# Patient Record
Sex: Male | Born: 1945 | Race: White | Hispanic: No | Marital: Married | Smoking: Former smoker
Health system: Southern US, Community
[De-identification: ages and names within clinical notes are randomized; demographics above are authoritative.]

## PROBLEM LIST (undated history)

## (undated) DIAGNOSIS — M1990 Unspecified osteoarthritis, unspecified site: Secondary | ICD-10-CM

## (undated) DIAGNOSIS — E785 Hyperlipidemia, unspecified: Secondary | ICD-10-CM

## (undated) DIAGNOSIS — I2510 Atherosclerotic heart disease of native coronary artery without angina pectoris: Secondary | ICD-10-CM

## (undated) DIAGNOSIS — F329 Major depressive disorder, single episode, unspecified: Secondary | ICD-10-CM

## (undated) DIAGNOSIS — Z955 Presence of coronary angioplasty implant and graft: Secondary | ICD-10-CM

## (undated) DIAGNOSIS — K219 Gastro-esophageal reflux disease without esophagitis: Secondary | ICD-10-CM

## (undated) DIAGNOSIS — I2119 ST elevation (STEMI) myocardial infarction involving other coronary artery of inferior wall: Secondary | ICD-10-CM

## (undated) DIAGNOSIS — F419 Anxiety disorder, unspecified: Secondary | ICD-10-CM

## (undated) DIAGNOSIS — I208 Other forms of angina pectoris: Secondary | ICD-10-CM

## (undated) DIAGNOSIS — N400 Enlarged prostate without lower urinary tract symptoms: Secondary | ICD-10-CM

## (undated) DIAGNOSIS — F32A Depression, unspecified: Secondary | ICD-10-CM

## (undated) HISTORY — DX: Major depressive disorder, single episode, unspecified: F32.9

## (undated) HISTORY — DX: Gastro-esophageal reflux disease without esophagitis: K21.9

## (undated) HISTORY — DX: Depression, unspecified: F32.A

## (undated) HISTORY — DX: Benign prostatic hyperplasia without lower urinary tract symptoms: N40.0

## (undated) HISTORY — PX: OTHER SURGICAL HISTORY: SHX169

## (undated) HISTORY — PX: COLONOSCOPY: SHX174

## (undated) HISTORY — DX: Anxiety disorder, unspecified: F41.9

---

## 2000-12-18 ENCOUNTER — Encounter: Admission: RE | Admit: 2000-12-18 | Discharge: 2000-12-18 | Payer: Self-pay | Admitting: Family Medicine

## 2000-12-18 ENCOUNTER — Encounter: Payer: Self-pay | Admitting: Family Medicine

## 2005-01-02 ENCOUNTER — Emergency Department (HOSPITAL_COMMUNITY): Admission: EM | Admit: 2005-01-02 | Discharge: 2005-01-02 | Payer: Self-pay | Admitting: Emergency Medicine

## 2006-08-29 ENCOUNTER — Encounter: Admission: RE | Admit: 2006-08-29 | Discharge: 2006-08-29 | Payer: Self-pay | Admitting: Family Medicine

## 2007-07-13 ENCOUNTER — Inpatient Hospital Stay (HOSPITAL_COMMUNITY): Admission: AC | Admit: 2007-07-13 | Discharge: 2007-07-14 | Payer: Self-pay

## 2007-07-14 ENCOUNTER — Ambulatory Visit: Payer: Self-pay | Admitting: Psychiatry

## 2007-07-14 ENCOUNTER — Inpatient Hospital Stay (HOSPITAL_COMMUNITY): Admission: RE | Admit: 2007-07-14 | Discharge: 2007-07-26 | Payer: Self-pay | Admitting: Psychiatry

## 2007-07-16 ENCOUNTER — Ambulatory Visit (HOSPITAL_COMMUNITY): Admission: RE | Admit: 2007-07-16 | Discharge: 2007-07-16 | Payer: Self-pay | Admitting: Psychiatry

## 2007-07-18 ENCOUNTER — Ambulatory Visit (HOSPITAL_COMMUNITY): Admission: RE | Admit: 2007-07-18 | Discharge: 2007-07-18 | Payer: Self-pay | Admitting: Psychiatry

## 2007-07-27 ENCOUNTER — Other Ambulatory Visit (HOSPITAL_COMMUNITY): Admission: RE | Admit: 2007-07-27 | Discharge: 2007-10-25 | Payer: Self-pay | Admitting: Psychiatry

## 2007-08-10 ENCOUNTER — Ambulatory Visit (HOSPITAL_COMMUNITY): Payer: Self-pay | Admitting: Licensed Clinical Social Worker

## 2007-08-12 ENCOUNTER — Ambulatory Visit (HOSPITAL_COMMUNITY): Payer: Self-pay | Admitting: Psychiatry

## 2007-08-19 ENCOUNTER — Ambulatory Visit (HOSPITAL_COMMUNITY): Payer: Self-pay | Admitting: Licensed Clinical Social Worker

## 2007-08-27 ENCOUNTER — Ambulatory Visit (HOSPITAL_COMMUNITY): Payer: Self-pay | Admitting: Licensed Clinical Social Worker

## 2007-09-02 ENCOUNTER — Ambulatory Visit (HOSPITAL_COMMUNITY): Payer: Self-pay | Admitting: Licensed Clinical Social Worker

## 2007-09-03 ENCOUNTER — Ambulatory Visit (HOSPITAL_COMMUNITY): Payer: Self-pay | Admitting: Psychiatry

## 2007-09-09 ENCOUNTER — Ambulatory Visit (HOSPITAL_COMMUNITY): Payer: Self-pay | Admitting: Licensed Clinical Social Worker

## 2007-09-23 ENCOUNTER — Ambulatory Visit (HOSPITAL_COMMUNITY): Payer: Self-pay | Admitting: Licensed Clinical Social Worker

## 2007-09-28 ENCOUNTER — Ambulatory Visit (HOSPITAL_COMMUNITY): Payer: Self-pay | Admitting: Licensed Clinical Social Worker

## 2007-10-05 ENCOUNTER — Ambulatory Visit (HOSPITAL_COMMUNITY): Payer: Self-pay | Admitting: Psychiatry

## 2007-10-07 ENCOUNTER — Ambulatory Visit (HOSPITAL_COMMUNITY): Payer: Self-pay | Admitting: Licensed Clinical Social Worker

## 2007-10-13 ENCOUNTER — Ambulatory Visit (HOSPITAL_COMMUNITY): Payer: Self-pay | Admitting: Licensed Clinical Social Worker

## 2007-10-20 ENCOUNTER — Ambulatory Visit (HOSPITAL_COMMUNITY): Payer: Self-pay | Admitting: Licensed Clinical Social Worker

## 2007-10-26 ENCOUNTER — Ambulatory Visit (HOSPITAL_COMMUNITY): Payer: Self-pay | Admitting: Licensed Clinical Social Worker

## 2007-11-02 ENCOUNTER — Ambulatory Visit (HOSPITAL_COMMUNITY): Payer: Self-pay | Admitting: Psychiatry

## 2007-11-12 ENCOUNTER — Ambulatory Visit (HOSPITAL_COMMUNITY): Payer: Self-pay | Admitting: Licensed Clinical Social Worker

## 2007-11-19 ENCOUNTER — Ambulatory Visit (HOSPITAL_COMMUNITY): Payer: Self-pay | Admitting: Licensed Clinical Social Worker

## 2007-11-23 ENCOUNTER — Ambulatory Visit (HOSPITAL_COMMUNITY): Payer: Self-pay | Admitting: Licensed Clinical Social Worker

## 2007-12-02 ENCOUNTER — Ambulatory Visit (HOSPITAL_COMMUNITY): Payer: Self-pay | Admitting: Licensed Clinical Social Worker

## 2007-12-07 ENCOUNTER — Ambulatory Visit (HOSPITAL_COMMUNITY): Payer: Self-pay | Admitting: Psychiatry

## 2007-12-09 ENCOUNTER — Ambulatory Visit (HOSPITAL_COMMUNITY): Payer: Self-pay | Admitting: Licensed Clinical Social Worker

## 2007-12-17 ENCOUNTER — Ambulatory Visit (HOSPITAL_COMMUNITY): Payer: Self-pay | Admitting: Licensed Clinical Social Worker

## 2007-12-23 ENCOUNTER — Ambulatory Visit (HOSPITAL_COMMUNITY): Payer: Self-pay | Admitting: Licensed Clinical Social Worker

## 2007-12-31 ENCOUNTER — Ambulatory Visit (HOSPITAL_COMMUNITY): Payer: Self-pay | Admitting: Licensed Clinical Social Worker

## 2008-01-12 ENCOUNTER — Ambulatory Visit (HOSPITAL_COMMUNITY): Payer: Self-pay | Admitting: Psychiatry

## 2008-01-20 ENCOUNTER — Ambulatory Visit (HOSPITAL_COMMUNITY): Payer: Self-pay | Admitting: Licensed Clinical Social Worker

## 2008-02-04 ENCOUNTER — Ambulatory Visit (HOSPITAL_COMMUNITY): Payer: Self-pay | Admitting: Licensed Clinical Social Worker

## 2008-02-18 ENCOUNTER — Ambulatory Visit (HOSPITAL_COMMUNITY): Payer: Self-pay | Admitting: Licensed Clinical Social Worker

## 2008-03-09 ENCOUNTER — Ambulatory Visit (HOSPITAL_COMMUNITY): Payer: Self-pay | Admitting: Psychiatry

## 2008-03-24 ENCOUNTER — Ambulatory Visit (HOSPITAL_COMMUNITY): Payer: Self-pay | Admitting: Licensed Clinical Social Worker

## 2008-04-15 ENCOUNTER — Ambulatory Visit (HOSPITAL_COMMUNITY): Payer: Self-pay | Admitting: Psychiatry

## 2008-04-21 ENCOUNTER — Ambulatory Visit (HOSPITAL_COMMUNITY): Payer: Self-pay | Admitting: Psychiatry

## 2008-05-05 ENCOUNTER — Ambulatory Visit (HOSPITAL_COMMUNITY): Payer: Self-pay | Admitting: Psychiatry

## 2008-05-12 ENCOUNTER — Encounter: Payer: Self-pay | Admitting: Cardiology

## 2008-05-26 ENCOUNTER — Ambulatory Visit (HOSPITAL_COMMUNITY): Payer: Self-pay | Admitting: Psychiatry

## 2008-06-08 ENCOUNTER — Ambulatory Visit (HOSPITAL_COMMUNITY): Payer: Self-pay | Admitting: Psychiatry

## 2008-06-17 ENCOUNTER — Ambulatory Visit (HOSPITAL_COMMUNITY): Payer: Self-pay | Admitting: Psychiatry

## 2008-07-06 ENCOUNTER — Ambulatory Visit (HOSPITAL_COMMUNITY): Payer: Self-pay | Admitting: Psychiatry

## 2008-07-20 ENCOUNTER — Ambulatory Visit (HOSPITAL_COMMUNITY): Payer: Self-pay | Admitting: Psychiatry

## 2008-07-27 ENCOUNTER — Ambulatory Visit (HOSPITAL_COMMUNITY): Payer: Self-pay | Admitting: Psychiatry

## 2008-08-08 ENCOUNTER — Ambulatory Visit (HOSPITAL_COMMUNITY): Payer: Self-pay | Admitting: Psychiatry

## 2008-08-25 ENCOUNTER — Ambulatory Visit (HOSPITAL_COMMUNITY): Payer: Self-pay | Admitting: Psychiatry

## 2008-10-03 ENCOUNTER — Ambulatory Visit (HOSPITAL_COMMUNITY): Payer: Self-pay | Admitting: Psychiatry

## 2008-10-31 ENCOUNTER — Ambulatory Visit (HOSPITAL_COMMUNITY): Payer: Self-pay | Admitting: Psychiatry

## 2008-11-02 ENCOUNTER — Ambulatory Visit (HOSPITAL_COMMUNITY): Payer: Self-pay | Admitting: Psychiatry

## 2008-11-17 ENCOUNTER — Ambulatory Visit (HOSPITAL_COMMUNITY): Payer: Self-pay | Admitting: Psychiatry

## 2008-12-07 ENCOUNTER — Ambulatory Visit (HOSPITAL_COMMUNITY): Payer: Self-pay | Admitting: Psychiatry

## 2008-12-21 ENCOUNTER — Ambulatory Visit (HOSPITAL_COMMUNITY): Payer: Self-pay | Admitting: Psychiatry

## 2009-01-03 ENCOUNTER — Ambulatory Visit (HOSPITAL_COMMUNITY): Payer: Self-pay | Admitting: Psychiatry

## 2009-01-25 ENCOUNTER — Ambulatory Visit (HOSPITAL_COMMUNITY): Payer: Self-pay | Admitting: Psychiatry

## 2009-02-08 ENCOUNTER — Ambulatory Visit (HOSPITAL_COMMUNITY): Payer: Self-pay | Admitting: Psychiatry

## 2009-03-10 ENCOUNTER — Ambulatory Visit (HOSPITAL_COMMUNITY): Payer: Self-pay | Admitting: Psychiatry

## 2009-04-12 ENCOUNTER — Ambulatory Visit (HOSPITAL_COMMUNITY): Payer: Self-pay | Admitting: Psychiatry

## 2009-04-19 ENCOUNTER — Ambulatory Visit (HOSPITAL_COMMUNITY): Payer: Self-pay | Admitting: Psychiatry

## 2009-05-10 ENCOUNTER — Ambulatory Visit (HOSPITAL_COMMUNITY): Payer: Self-pay | Admitting: Psychiatry

## 2009-05-24 ENCOUNTER — Ambulatory Visit (HOSPITAL_COMMUNITY): Payer: Self-pay | Admitting: Psychiatry

## 2009-06-14 ENCOUNTER — Ambulatory Visit (HOSPITAL_COMMUNITY): Payer: Self-pay | Admitting: Licensed Clinical Social Worker

## 2009-06-19 ENCOUNTER — Encounter: Payer: Self-pay | Admitting: Cardiology

## 2009-06-26 ENCOUNTER — Encounter: Payer: Self-pay | Admitting: Cardiology

## 2009-06-30 ENCOUNTER — Encounter: Payer: Self-pay | Admitting: Cardiology

## 2009-07-12 ENCOUNTER — Ambulatory Visit (HOSPITAL_COMMUNITY): Payer: Self-pay | Admitting: Licensed Clinical Social Worker

## 2009-08-07 ENCOUNTER — Ambulatory Visit: Payer: Self-pay | Admitting: Cardiology

## 2009-08-07 DIAGNOSIS — R079 Chest pain, unspecified: Secondary | ICD-10-CM

## 2009-08-23 ENCOUNTER — Ambulatory Visit (HOSPITAL_COMMUNITY): Payer: Self-pay | Admitting: Licensed Clinical Social Worker

## 2009-08-30 ENCOUNTER — Ambulatory Visit: Payer: Self-pay | Admitting: Cardiology

## 2009-08-30 ENCOUNTER — Ambulatory Visit: Payer: Self-pay

## 2009-09-13 ENCOUNTER — Encounter: Admission: RE | Admit: 2009-09-13 | Discharge: 2009-09-13 | Payer: Self-pay | Admitting: Family Medicine

## 2009-09-27 ENCOUNTER — Ambulatory Visit (HOSPITAL_COMMUNITY): Payer: Self-pay | Admitting: Psychiatry

## 2009-10-25 ENCOUNTER — Ambulatory Visit (HOSPITAL_COMMUNITY): Payer: Self-pay | Admitting: Psychiatry

## 2009-11-01 ENCOUNTER — Ambulatory Visit (HOSPITAL_COMMUNITY): Payer: Self-pay | Admitting: Psychiatry

## 2009-11-29 ENCOUNTER — Ambulatory Visit (HOSPITAL_COMMUNITY): Payer: Self-pay | Admitting: Psychiatry

## 2009-12-20 ENCOUNTER — Ambulatory Visit (HOSPITAL_COMMUNITY): Payer: Self-pay | Admitting: Licensed Clinical Social Worker

## 2010-01-03 ENCOUNTER — Ambulatory Visit (HOSPITAL_COMMUNITY): Payer: Self-pay | Admitting: Psychiatry

## 2010-02-21 ENCOUNTER — Ambulatory Visit (HOSPITAL_COMMUNITY): Payer: Self-pay | Admitting: Licensed Clinical Social Worker

## 2010-03-28 ENCOUNTER — Ambulatory Visit (HOSPITAL_COMMUNITY): Payer: Self-pay | Admitting: Psychiatry

## 2010-04-30 ENCOUNTER — Ambulatory Visit (HOSPITAL_COMMUNITY): Payer: Self-pay | Admitting: Licensed Clinical Social Worker

## 2010-06-20 ENCOUNTER — Ambulatory Visit (HOSPITAL_COMMUNITY): Payer: Self-pay | Admitting: Psychiatry

## 2010-06-27 ENCOUNTER — Ambulatory Visit (HOSPITAL_COMMUNITY): Payer: Self-pay | Admitting: Psychiatry

## 2010-10-01 ENCOUNTER — Ambulatory Visit (HOSPITAL_COMMUNITY)
Admission: RE | Admit: 2010-10-01 | Discharge: 2010-10-01 | Payer: Self-pay | Source: Home / Self Care | Attending: Licensed Clinical Social Worker | Admitting: Licensed Clinical Social Worker

## 2010-10-24 ENCOUNTER — Encounter (HOSPITAL_COMMUNITY): Payer: Managed Care, Other (non HMO) | Admitting: Physician Assistant

## 2010-10-24 DIAGNOSIS — F332 Major depressive disorder, recurrent severe without psychotic features: Secondary | ICD-10-CM

## 2010-12-11 ENCOUNTER — Encounter (HOSPITAL_COMMUNITY): Payer: Managed Care, Other (non HMO) | Admitting: Physician Assistant

## 2010-12-11 DIAGNOSIS — F411 Generalized anxiety disorder: Secondary | ICD-10-CM

## 2010-12-11 DIAGNOSIS — F988 Other specified behavioral and emotional disorders with onset usually occurring in childhood and adolescence: Secondary | ICD-10-CM

## 2010-12-11 DIAGNOSIS — F331 Major depressive disorder, recurrent, moderate: Secondary | ICD-10-CM

## 2011-01-01 ENCOUNTER — Encounter (HOSPITAL_COMMUNITY): Payer: Self-pay | Admitting: Marriage and Family Therapist

## 2011-01-01 ENCOUNTER — Encounter (HOSPITAL_BASED_OUTPATIENT_CLINIC_OR_DEPARTMENT_OTHER): Payer: Managed Care, Other (non HMO) | Admitting: Licensed Clinical Social Worker

## 2011-01-01 DIAGNOSIS — F332 Major depressive disorder, recurrent severe without psychotic features: Secondary | ICD-10-CM
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
## 2011-01-29 NOTE — Op Note (Signed)
[name], Randy Peterson              ACCOUNT NO.:  [id number]   MEDICAL RECORD NO.:  [id number]          PATIENT TYPE:  EMS   LOCATION:  MAJO                         FACILITY:  [facility]   PHYSICIAN:  Gabrielle Dare. Janee Morn, M.D.DATE OF BIRTH:  January 04, 1946   DATE OF PROCEDURE:  07/13/2007  DATE OF DISCHARGE:                               OPERATIVE REPORT   PROCEDURE:  Simple closure of bilateral wrist lacerations.   INDICATIONS:  Self-inflicted lacerations of bilateral wrists.   SURGEON:  Nolon Bussing. Tinnie Gens, PA-C.   ASSISTANTS:  None.   ANESTHESIA:  1% lidocaine with epinephrine.   ESTIMATED BLOOD LOSS:  Less than 5 mL.   COMPLICATIONS:  None.   FINDINGS:  After the patient was prepped, 1% lidocaine with epinephrine  was infiltrated into the left wrist for anesthesia without difficulty.  He was then prepped and draped in the sterile fashion.  A subcuticular  Vicryl Rapide suture was used to close the transverse laceration to the  left wrist.  A similar technique was employed for the smaller  laceration.  He was then dressed without difficulty.  Attention was then  directed to the right wrist.  Again, after prepping, anesthesia was  infiltrated without difficulty.  He was then prepped, draped, and  irrigated in sterile fashion.  As these lacerations were more regular,  they were closed with interrupted 5-0 Prolene sutures.  The patient  tolerated both procedures well.      Earney Hamburg, P.A.      Gabrielle Dare Janee Morn, M.D.  Electronically Signed    MJ/MEDQ  D:  07/13/2007  T:  07/13/2007  Job:  [id number]
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
## 2011-02-06 ENCOUNTER — Encounter (HOSPITAL_COMMUNITY): Payer: Managed Care, Other (non HMO) | Admitting: Licensed Clinical Social Worker

## 2011-02-15 ENCOUNTER — Other Ambulatory Visit: Payer: Self-pay | Admitting: Family Medicine

## 2011-02-15 ENCOUNTER — Encounter (HOSPITAL_COMMUNITY): Payer: Managed Care, Other (non HMO) | Admitting: Licensed Clinical Social Worker

## 2011-02-15 ENCOUNTER — Ambulatory Visit
Admission: RE | Admit: 2011-02-15 | Discharge: 2011-02-15 | Disposition: A | Discharge status: Home / Self Care | Payer: Managed Care, Other (non HMO) | Source: Ambulatory Visit | Attending: Family Medicine | Admitting: Family Medicine

## 2011-02-15 DIAGNOSIS — R52 Pain, unspecified: Secondary | ICD-10-CM

## 2011-02-15 DIAGNOSIS — F331 Major depressive disorder, recurrent, moderate: Secondary | ICD-10-CM

## 2011-04-02 ENCOUNTER — Encounter (HOSPITAL_COMMUNITY): Payer: Managed Care, Other (non HMO) | Admitting: Physician Assistant

## 2011-04-10 ENCOUNTER — Encounter (HOSPITAL_COMMUNITY): Payer: Managed Care, Other (non HMO) | Admitting: Physician Assistant

## 2011-04-19 ENCOUNTER — Encounter (HOSPITAL_BASED_OUTPATIENT_CLINIC_OR_DEPARTMENT_OTHER): Payer: Managed Care, Other (non HMO) | Admitting: Licensed Clinical Social Worker

## 2011-04-19 DIAGNOSIS — F331 Major depressive disorder, recurrent, moderate: Secondary | ICD-10-CM

## 2011-04-30 ENCOUNTER — Encounter (HOSPITAL_COMMUNITY): Payer: Managed Care, Other (non HMO) | Admitting: Licensed Clinical Social Worker

## 2011-05-03 ENCOUNTER — Encounter (HOSPITAL_COMMUNITY): Payer: Managed Care, Other (non HMO) | Admitting: Licensed Clinical Social Worker

## 2011-05-03 DIAGNOSIS — F331 Major depressive disorder, recurrent, moderate: Secondary | ICD-10-CM

## 2011-05-10 ENCOUNTER — Encounter (HOSPITAL_COMMUNITY): Payer: Managed Care, Other (non HMO) | Admitting: Licensed Clinical Social Worker

## 2011-05-10 DIAGNOSIS — F331 Major depressive disorder, recurrent, moderate: Secondary | ICD-10-CM

## 2011-05-16 ENCOUNTER — Encounter (INDEPENDENT_AMBULATORY_CARE_PROVIDER_SITE_OTHER): Payer: Managed Care, Other (non HMO) | Admitting: Physician Assistant

## 2011-05-16 DIAGNOSIS — F331 Major depressive disorder, recurrent, moderate: Secondary | ICD-10-CM

## 2011-05-16 DIAGNOSIS — F411 Generalized anxiety disorder: Secondary | ICD-10-CM

## 2011-05-16 DIAGNOSIS — F988 Other specified behavioral and emotional disorders with onset usually occurring in childhood and adolescence: Secondary | ICD-10-CM

## 2011-05-21 ENCOUNTER — Encounter (HOSPITAL_COMMUNITY): Payer: Managed Care, Other (non HMO) | Admitting: Licensed Clinical Social Worker

## 2011-05-21 DIAGNOSIS — F331 Major depressive disorder, recurrent, moderate: Secondary | ICD-10-CM

## 2011-05-24 ENCOUNTER — Encounter (HOSPITAL_COMMUNITY): Payer: Managed Care, Other (non HMO) | Admitting: Licensed Clinical Social Worker

## 2011-06-11 ENCOUNTER — Encounter (HOSPITAL_COMMUNITY): Payer: Managed Care, Other (non HMO) | Admitting: Licensed Clinical Social Worker

## 2011-06-11 DIAGNOSIS — F331 Major depressive disorder, recurrent, moderate: Secondary | ICD-10-CM

## 2011-06-17 ENCOUNTER — Encounter (HOSPITAL_COMMUNITY): Payer: Managed Care, Other (non HMO) | Admitting: Licensed Clinical Social Worker

## 2011-06-25 LAB — VITAMIN B12: Vitamin B-12: 496 (ref 211–911)

## 2011-06-25 LAB — CREATININE, SERUM: GFR calc Af Amer: >60

## 2011-06-26 LAB — I-STAT 8, (EC8 V) (CONVERTED LAB)
Bicarbonate: 25.6 — ABNORMAL HIGH
Chloride: 107
Glucose, Bld: 127 — ABNORMAL HIGH
HCT: 35 — ABNORMAL LOW
Operator id: 198171
Sodium: 141

## 2011-06-26 LAB — URINALYSIS, MICROSCOPIC ONLY
Bilirubin Urine: NEGATIVE
Glucose, UA: NEGATIVE
Hgb urine dipstick: NEGATIVE
Leukocytes, UA: NEGATIVE
Nitrite: NEGATIVE
Specific Gravity, Urine: 1.014
pH: 6

## 2011-06-26 LAB — CBC
HCT: 34.2 — ABNORMAL LOW
Hemoglobin: 12.1 — ABNORMAL LOW
MCHC: 34.2
MCV: 90.3
Platelets: 232
Platelets: 233
RBC: 3.95 — ABNORMAL LOW
RDW: 13.6
WBC: 7.2

## 2011-06-26 LAB — ACETAMINOPHEN LEVEL: Acetaminophen (Tylenol), Serum: <10 — ABNORMAL LOW

## 2011-06-26 LAB — TSH: TSH: 1.658

## 2011-06-26 LAB — RAPID URINE DRUG SCREEN, HOSP PERFORMED
Amphetamines: NOT DETECTED
Barbiturates: NOT DETECTED
Cocaine: NOT DETECTED
Tetrahydrocannabinol: NOT DETECTED

## 2011-06-26 LAB — BASIC METABOLIC PANEL
Glucose, Bld: 119 — ABNORMAL HIGH
Sodium: 140

## 2011-06-26 LAB — SALICYLATE LEVEL: Salicylate Lvl: <4

## 2011-06-26 LAB — POCT I-STAT CREATININE: Creatinine, Ser: 1.3

## 2011-06-26 LAB — SAMPLE TO BLOOD BANK

## 2011-06-26 LAB — ETHANOL: Alcohol, Ethyl (B): <5

## 2011-06-28 ENCOUNTER — Encounter (HOSPITAL_COMMUNITY): Payer: Managed Care, Other (non HMO) | Admitting: Licensed Clinical Social Worker

## 2011-07-01 ENCOUNTER — Encounter (HOSPITAL_COMMUNITY): Payer: Managed Care, Other (non HMO) | Admitting: Licensed Clinical Social Worker

## 2011-07-01 DIAGNOSIS — F331 Major depressive disorder, recurrent, moderate: Secondary | ICD-10-CM

## 2011-07-03 ENCOUNTER — Encounter (HOSPITAL_COMMUNITY): Payer: Managed Care, Other (non HMO) | Admitting: Licensed Clinical Social Worker

## 2011-07-19 ENCOUNTER — Encounter (HOSPITAL_COMMUNITY): Payer: Managed Care, Other (non HMO) | Admitting: Licensed Clinical Social Worker

## 2011-07-19 DIAGNOSIS — F331 Major depressive disorder, recurrent, moderate: Secondary | ICD-10-CM

## 2011-08-15 ENCOUNTER — Ambulatory Visit (INDEPENDENT_AMBULATORY_CARE_PROVIDER_SITE_OTHER): Payer: Managed Care, Other (non HMO) | Admitting: Physician Assistant

## 2011-08-15 DIAGNOSIS — F331 Major depressive disorder, recurrent, moderate: Secondary | ICD-10-CM

## 2011-08-15 DIAGNOSIS — F411 Generalized anxiety disorder: Secondary | ICD-10-CM

## 2011-08-15 DIAGNOSIS — F988 Other specified behavioral and emotional disorders with onset usually occurring in childhood and adolescence: Secondary | ICD-10-CM

## 2011-08-15 MED ORDER — LISDEXAMFETAMINE DIMESYLATE 70 MG PO CAPS: 70.0000 mg | ORAL_CAPSULE | ORAL | Status: DC

## 2011-08-15 NOTE — Progress Notes (Signed)
   [facility] Behavioral Health Follow-up Outpatient Visit  Randy Peterson [birth year]  Date: 08/15/11   Subjective: Randy Peterson reports that he is doing fairly well. He is fully retired now but still dealing with his mothers estate, which is a source of stress. He reports that his mood has been stable. He wants to continue taking the Vyvanse as his wife complains that he can't get anything done around the house. His anxiety is well managed. He is sleeping well and his appetite is good. He denies any suicidal or homicidal ideation. He denies any auditory or visual hallucinations.  There were no vitals filed for this visit.  Mental Status Examination  Appearance: Well groomed and dressed Alert: Yes Attention: good  Cooperative: Yes Eye Contact: Good Speech: Clear and even Psychomotor Activity: Normal Memory/Concentration: Intact Oriented: person, place, time/date and situation Mood: Euthymic Affect: Congruent Thought Processes and Associations: Linear Fund of Knowledge: Good Thought Content:  Insight: Good Judgement: Good  Diagnosis: Maj. depressive disorder recurrent moderate, generalized anxiety disorder, ADHD inattentive type.  Treatment Plan: We will continue all medications as prescribed and followup in 3 months.  Sharonna Vinje, PA

## 2011-09-11 ENCOUNTER — Other Ambulatory Visit (HOSPITAL_COMMUNITY): Payer: Self-pay | Admitting: Physician Assistant

## 2011-09-11 DIAGNOSIS — F331 Major depressive disorder, recurrent, moderate: Secondary | ICD-10-CM

## 2011-09-23 ENCOUNTER — Ambulatory Visit (HOSPITAL_COMMUNITY): Payer: Managed Care, Other (non HMO) | Admitting: Licensed Clinical Social Worker

## 2011-10-03 ENCOUNTER — Other Ambulatory Visit (HOSPITAL_COMMUNITY): Payer: Self-pay | Admitting: Physician Assistant

## 2011-10-03 DIAGNOSIS — F331 Major depressive disorder, recurrent, moderate: Secondary | ICD-10-CM

## 2011-10-03 DIAGNOSIS — F411 Generalized anxiety disorder: Secondary | ICD-10-CM

## 2011-10-07 ENCOUNTER — Ambulatory Visit (INDEPENDENT_AMBULATORY_CARE_PROVIDER_SITE_OTHER): Payer: Managed Care, Other (non HMO) | Admitting: Licensed Clinical Social Worker

## 2011-10-07 ENCOUNTER — Encounter (HOSPITAL_COMMUNITY): Payer: Self-pay | Admitting: Licensed Clinical Social Worker

## 2011-10-07 DIAGNOSIS — F332 Major depressive disorder, recurrent severe without psychotic features: Secondary | ICD-10-CM

## 2011-10-07 NOTE — Progress Notes (Signed)
   THERAPIST PROGRESS NOTE  Session Time: 8:45am-9:30am  Participation Level: Active  Behavioral Response: Well GroomedAlertEuthymic  Type of Therapy: Individual Therapy  Treatment Goals addressed: Coping  Interventions: Solution Focused, Strength-based, Supportive and Family Systems  Summary: Randy Peterson is a 66 y.o. male who presents with euthymic mood and affect.He is 15 minutes late for his appointment today and reports that he is always late for things and gets distracted, losses track of time and ends up late. While this frustrates him, he has been unable to learn proper time management. This is the first session with patient in two months since this therapist has been on leave. Patient continues to deal with closing his mothers estate and describes stress related to this process. He has worked through most of his resentment towards parts of his family who have been unhelpful throughout this process. He is officially retired and plans to work part time after the estate is settled. His marriage is under stress due to settling the estate and he is bothered by his wife's resentment towards family members. His appetite is increased, with weight gain and his sleep schedule is disrupted. He goes to sleep at 2am some nights and will sleep 8 hours. He recognizes the need to get his sleep on a regular schedule.    Suicidal/Homicidal: Nowithout intent/plan  Therapist Response: Overall, patient is doing well. Assisted him with processing his grief reaction. Encouraged him to take a break from going through all his mothers photos and papers once he hands the house over. His self care needs improvement and he recognizes that. Explored and educated patient on anticipatory feelings dealing with leaving the home he grew up in to someone else. Assisted patient with the expression of his feelings of grief and sadness. Used motivational interviewing to assist and encourage patient through the change  process. Explored patients barriers to change. His attention and focus remain compromised.  Plan: Return again in six weeks.  Diagnosis: Axis I: Major Depression, Recurrent severe    Axis II: Deferred    Lisseth Brazeau, LCSW 10/07/2011

## 2011-11-14 ENCOUNTER — Ambulatory Visit (INDEPENDENT_AMBULATORY_CARE_PROVIDER_SITE_OTHER): Payer: Managed Care, Other (non HMO) | Admitting: Physician Assistant

## 2011-11-14 DIAGNOSIS — F988 Other specified behavioral and emotional disorders with onset usually occurring in childhood and adolescence: Secondary | ICD-10-CM

## 2011-11-14 DIAGNOSIS — F331 Major depressive disorder, recurrent, moderate: Secondary | ICD-10-CM

## 2011-11-14 DIAGNOSIS — F411 Generalized anxiety disorder: Secondary | ICD-10-CM

## 2011-11-14 MED ORDER — LISDEXAMFETAMINE DIMESYLATE 70 MG PO CAPS: 70.0000 mg | ORAL_CAPSULE | ORAL | Status: DC

## 2011-11-14 MED ORDER — DULOXETINE HCL 30 MG PO CPEP: 90.0000 mg | ORAL_CAPSULE | Freq: Every day | ORAL | Status: DC

## 2011-11-14 NOTE — Progress Notes (Signed)
   [facility] Behavioral Health Follow-up Outpatient Visit  Randy Peterson [birth year]  Date: 11/14/11   Subjective: Randy Peterson presents today to followup on his medications for anxiety, depression, and ADHD. He reports that he has been doing very well. He feels that retirement has been good for him. He is staying busy cleaning out his deceased mothers home in [place] preparing it to sell. He reports that his mood has been stable, his anxiety is well managed, he is eating and sleeping well, and he has not experienced any SI/HI nor AVH.  There were no vitals filed for this visit.  Mental Status Examination  Appearance: Well groomed and casually dressed Alert: Yes Attention: good  Cooperative: Yes Eye Contact: Good Speech: Clear and even Psychomotor Activity: Normal Memory/Concentration: Intact Oriented: person, place, time/date and situation Mood: Euthymic Affect: Appropriate Thought Processes and Associations: Goal Directed and Linear Fund of Knowledge: Good Thought Content:  Insight: Good Judgement: Good  Diagnosis: Generalized anxiety disorder, major depressive disorder, ADHD.  Treatment Plan: We will continue all his medications as currently prescribed - alprazolam 1 mg 1 tablet each morning, one half tablet each evening, and one tablet at bedtime; Wellbutrin XL 150 mg daily; Cymbalta 90 mg daily and Vyvanse 70 mg daily. He will return for followup in 3 months  Sade Hollon, PA

## 2011-11-18 ENCOUNTER — Ambulatory Visit (HOSPITAL_COMMUNITY): Payer: Managed Care, Other (non HMO) | Admitting: Licensed Clinical Social Worker

## 2011-11-27 ENCOUNTER — Ambulatory Visit (HOSPITAL_COMMUNITY): Payer: Managed Care, Other (non HMO) | Admitting: Licensed Clinical Social Worker

## 2011-12-12 ENCOUNTER — Ambulatory Visit (INDEPENDENT_AMBULATORY_CARE_PROVIDER_SITE_OTHER): Payer: Managed Care, Other (non HMO) | Admitting: Licensed Clinical Social Worker

## 2011-12-12 ENCOUNTER — Encounter (HOSPITAL_COMMUNITY): Payer: Self-pay | Admitting: Licensed Clinical Social Worker

## 2011-12-12 DIAGNOSIS — F332 Major depressive disorder, recurrent severe without psychotic features: Secondary | ICD-10-CM | POA: Insufficient documentation

## 2011-12-12 NOTE — Progress Notes (Signed)
   THERAPIST PROGRESS NOTE  Session Time: 2:00pm-2:50pm  Participation Level: Active  Behavioral Response: Well GroomedAlertEuthymic  Type of Therapy: Individual Therapy  Treatment Goals addressed: Coping  Interventions: CBT, Motivational Interviewing, Strength-based and Supportive  Summary: Randy Peterson is a 66 y.o. male who presents with euthymic mood and bright affect. He reports doing well since his last session, now that he has finished settling his mothers estate. He is relieved that he has cleaned out his mothers home, has sold his portion and has used the money to fix his home. He feels guilty about how he has spent the money and guilty that he sold out his portion of the family home. He questions what his mother would have wanted him to do. He feels like he is getting more in touch with his feelings of loss now that his schedule is slowing down. He is working out at [employer] and wants to loose weight. He is happier now that he is retired and plans to look for volunteer work. His sleep and appetite are wnl.    Suicidal/Homicidal: Nowithout intent/plan  Therapist Response: Processed with patient his grief and loss. Normalized his grief response and encouraged him to express his sadness. Explored his feelings of guilt, provided feedback and reframed his distorted thoughts. Used CBT to assist patient with the identification of his negative distortions and irrational thoughts. Encouraged patient to verbalize alternative and factual responses which challenge his distortions. He demonstrates improved motivation to find activities and recreation now that he is no longer working. Overall, he is doing very well. Reviewed patients self care plan. Assessed his progress related to self care. Patient's self care is good. Recommend proper diet, regular exercise, socialization and recreation.   Plan: Return again in three months.  Diagnosis: Axis I: Major Depression, Recurrent severe    Axis II:  No diagnosis    Merridy Pascoe, LCSW 12/12/2011

## 2011-12-22 ENCOUNTER — Ambulatory Visit (INDEPENDENT_AMBULATORY_CARE_PROVIDER_SITE_OTHER): Payer: Managed Care, Other (non HMO) | Admitting: Emergency Medicine

## 2011-12-22 VITALS — BP 129/82 | HR 85 | Temp 98.3°F | Resp 16 | Ht 67.0 in | Wt 203.0 lb

## 2011-12-22 DIAGNOSIS — S61209A Unspecified open wound of unspecified finger without damage to nail, initial encounter: Secondary | ICD-10-CM

## 2011-12-22 NOTE — Patient Instructions (Signed)
Keep the area clean angina. Try to keep his ear clean and dry for the next week for the next week. If he has any drainage or redness he is to return to clinic

## 2011-12-22 NOTE — Progress Notes (Signed)
  Subjective:    Patient ID: Randy Peterson, male    DOB: 1946/02/17, 66 y.o.   MRN: [id number]  HPI    Review of Systems     Objective:   Physical Exam   Wound Procedure:  1.5 cm wound on left 2nd digit washed for 5 minutes with soap and water after injecting 1 cc of 1% lidocaine.  Telfa gauze applied and wide bandaid, then cotton wrapping and coban applied.  Wound instructions given, change dressing daily, apply tegaderm with daily dressing changes for first week.  Avoid getting finger wet except when bathing.       Assessment & Plan:  Finger Laceration cleansed and bandaged, instructions given.  Pt up to date on tetnus.

## 2011-12-22 NOTE — Progress Notes (Signed)
  Subjective:    Patient ID: Randy Peterson, male    DOB: [birth year], 66 y.o.   MRN: [id number]  HPI patient enters after trying to help his wife move some furniture sustaining a laceration to the index finger of the left hand. He is not having any active bleeding he has no other complaints he had tetanus shot about a year ago to    Review of Systems     Objective:   Physical Exam objective exam reveals a 1-1/2 cm area of evulsed skin off of the radial side of the index finger over the middle phalanx. This is not a laceration to true loss of the dermis. And epidermis.        Assessment & Plan:  We discussed options undermining the tissue around the laceration site and then approximating the margins versus treating the area with Xeroform and time. His tetanus is up-to-date with his treating it with Xeroform and time and allow this to heal fairly to

## 2011-12-29 ENCOUNTER — Other Ambulatory Visit (HOSPITAL_COMMUNITY): Payer: Self-pay | Admitting: Physician Assistant

## 2011-12-29 DIAGNOSIS — F411 Generalized anxiety disorder: Secondary | ICD-10-CM

## 2011-12-29 DIAGNOSIS — F331 Major depressive disorder, recurrent, moderate: Secondary | ICD-10-CM

## 2012-01-13 ENCOUNTER — Other Ambulatory Visit (HOSPITAL_COMMUNITY): Payer: Self-pay | Admitting: Physician Assistant

## 2012-02-04 ENCOUNTER — Ambulatory Visit (INDEPENDENT_AMBULATORY_CARE_PROVIDER_SITE_OTHER): Payer: Managed Care, Other (non HMO) | Admitting: Physician Assistant

## 2012-02-04 DIAGNOSIS — F411 Generalized anxiety disorder: Secondary | ICD-10-CM

## 2012-02-04 DIAGNOSIS — F909 Attention-deficit hyperactivity disorder, unspecified type: Secondary | ICD-10-CM

## 2012-02-04 DIAGNOSIS — F329 Major depressive disorder, single episode, unspecified: Secondary | ICD-10-CM

## 2012-02-04 DIAGNOSIS — F988 Other specified behavioral and emotional disorders with onset usually occurring in childhood and adolescence: Secondary | ICD-10-CM

## 2012-02-04 DIAGNOSIS — F330 Major depressive disorder, recurrent, mild: Secondary | ICD-10-CM

## 2012-02-04 MED ORDER — LISDEXAMFETAMINE DIMESYLATE 70 MG PO CAPS: 70.0000 mg | ORAL_CAPSULE | ORAL | Status: DC
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
## 2012-02-12 ENCOUNTER — Ambulatory Visit (HOSPITAL_COMMUNITY): Payer: Managed Care, Other (non HMO) | Admitting: Physician Assistant

## 2012-03-12 ENCOUNTER — Other Ambulatory Visit (HOSPITAL_COMMUNITY): Payer: Self-pay | Admitting: Physician Assistant

## 2012-03-12 DIAGNOSIS — F411 Generalized anxiety disorder: Secondary | ICD-10-CM

## 2012-03-13 ENCOUNTER — Ambulatory Visit (INDEPENDENT_AMBULATORY_CARE_PROVIDER_SITE_OTHER): Payer: Managed Care, Other (non HMO) | Admitting: Licensed Clinical Social Worker

## 2012-03-13 DIAGNOSIS — F332 Major depressive disorder, recurrent severe without psychotic features: Secondary | ICD-10-CM

## 2012-03-13 NOTE — Progress Notes (Signed)
   THERAPIST PROGRESS NOTE  Session Time: 1:00pm-1:50pm  Participation Level: Active  Behavioral Response: Well GroomedAlertEuthymic  Type of Therapy: Individual Therapy  Treatment Goals addressed: Coping  Interventions: Motivational Interviewing and Supportive  Summary: Randy Peterson is a 66 y.o. male who presents with euthymic mood and bright affect. He reports that he has been doing well, with an absence of depression, anxiety and no thoughts or desire for self harm. He does endorse some guilt over being retired and adjusting to this new lifestyle. He has been taking trips to the beach with his wife and finds himself much more relaxed than he has ever been. He does want to find something to involve himself in come fall and wants to volunteer with the [employer]. He continues to lack motivation for exercise and expresses a desire to loose weight, but has not followed through on his goals to address this. His sleep and appetite are wnl.    Suicidal/Homicidal: Nowithout intent/plan  Therapist Response: Processed w/pt his progress and assessed continuing treatment. Both patient and this therapist agree that therapy services will end today, due to patients well sustained improvement. Reviewed the progress that patient has made in treatment, the coping strategies that work for him and how he will continue to manage his stress. Reinforced the importance of self care and used motivational interviewing to assist patient with the change process for exercise. Will close out patients case, but advised patient that he may return if he needs to.   Plan: Patient is discharged from services due to improvement.   Diagnosis: Axis I: Major Depression, Recurrent severe    Axis II: No diagnosis    Randy Stan, LCSW 03/13/2012

## 2012-03-18 ENCOUNTER — Other Ambulatory Visit (HOSPITAL_COMMUNITY): Payer: Self-pay | Admitting: *Deleted

## 2012-03-18 NOTE — Telephone Encounter (Signed)
Xanax ordered on 03/12/12 was phoned in to pharmacy.

## 2012-04-07 ENCOUNTER — Other Ambulatory Visit (HOSPITAL_COMMUNITY): Payer: Self-pay | Admitting: Physician Assistant

## 2012-06-01 ENCOUNTER — Ambulatory Visit (INDEPENDENT_AMBULATORY_CARE_PROVIDER_SITE_OTHER): Payer: Managed Care, Other (non HMO) | Admitting: Physician Assistant

## 2012-06-01 DIAGNOSIS — F330 Major depressive disorder, recurrent, mild: Secondary | ICD-10-CM

## 2012-06-01 DIAGNOSIS — F988 Other specified behavioral and emotional disorders with onset usually occurring in childhood and adolescence: Secondary | ICD-10-CM

## 2012-06-01 DIAGNOSIS — F411 Generalized anxiety disorder: Secondary | ICD-10-CM

## 2012-06-01 MED ORDER — DULOXETINE HCL 30 MG PO CPEP: 90.0000 mg | ORAL_CAPSULE | Freq: Every day | ORAL | Status: DC

## 2012-06-01 MED ORDER — LISDEXAMFETAMINE DIMESYLATE 70 MG PO CAPS: 70.0000 mg | ORAL_CAPSULE | ORAL | Status: DC

## 2012-06-01 MED ORDER — ALPRAZOLAM 1 MG PO TABS: ORAL_TABLET | ORAL | Status: DC

## 2012-06-01 MED ORDER — BUPROPION HCL ER (XL) 150 MG PO TB24: 150.0000 mg | ORAL_TABLET | ORAL | Status: DC

## 2012-06-01 NOTE — Progress Notes (Signed)
   [facility] Behavioral Health Follow-up Outpatient Visit  Hoyt Leanos October 26, 1945  Date: 06/01/2012  Subjective: Randy Peterson presents today to followup on his treatment for depression and anxiety, as well as ADD. He reports that he is doing very well. His wife commented to him that this is the best summer they've ever had together. This is due to the fact that he is retired and she has summers off because she's a Chartered loss adjuster. They were able to take trips to the beach and spend quality time together. Antonio endorses that he is sleeping well, approximately 6-1/2 hours per night. His appetite is good. He is going to the gym, and trying to get his wife to join him. He is considering volunteer work or getting a part-time position now that his wife is back at work. He has reduced the amount of Xanax he has been taking, and reports now he takes 1 mg each morning, and one half milligram each evening. He denies any suicidal or homicidal ideation. He denies any auditory or visual hallucinations.  There were no vitals filed for this visit.  Mental Status Examination  Appearance: Casual Alert: Yes Attention: good  Cooperative: Yes Eye Contact: Good Speech: Clear and coherent Psychomotor Activity: Normal Memory/Concentration: Intact Oriented: person, place, time/date and situation Mood: Euthymic Affect: Appropriate Thought Processes and Associations: Linear Fund of Knowledge: Good Thought Content: Normal Insight: Good Judgement: Good  Diagnosis: Generalized anxiety disorder; major depressive disorder, recurrent, mild; ADHD, inattentive type  Treatment Plan: We will continue his Wellbutrin XL 150 mg daily, Cymbalta 90 mg daily, Vyvanse at 70 mg daily. We will reduce his Xanax to 1 mg each morning and one half milligram each evening. He will return for followup in 4 months.  Alisa Stjames, PA-C

## 2012-06-26 ENCOUNTER — Other Ambulatory Visit (HOSPITAL_COMMUNITY): Payer: Self-pay | Admitting: Physician Assistant

## 2012-07-27 ENCOUNTER — Other Ambulatory Visit (HOSPITAL_COMMUNITY): Payer: Self-pay | Admitting: *Deleted

## 2012-07-27 DIAGNOSIS — F988 Other specified behavioral and emotional disorders with onset usually occurring in childhood and adolescence: Secondary | ICD-10-CM

## 2012-07-27 DIAGNOSIS — F330 Major depressive disorder, recurrent, mild: Secondary | ICD-10-CM

## 2012-07-27 DIAGNOSIS — F411 Generalized anxiety disorder: Secondary | ICD-10-CM

## 2012-07-27 MED ORDER — LISDEXAMFETAMINE DIMESYLATE 70 MG PO CAPS: 70.0000 mg | ORAL_CAPSULE | ORAL | Status: DC

## 2012-07-28 ENCOUNTER — Telehealth (HOSPITAL_COMMUNITY): Payer: Self-pay

## 2012-07-28 MED ORDER — ALPRAZOLAM 1 MG PO TABS: ORAL_TABLET | ORAL | Status: DC

## 2012-07-28 MED ORDER — DULOXETINE HCL 30 MG PO CPEP: 90.0000 mg | ORAL_CAPSULE | Freq: Every day | ORAL | Status: DC

## 2012-07-28 MED ORDER — BUPROPION HCL ER (XL) 150 MG PO TB24: 150.0000 mg | ORAL_TABLET | ORAL | Status: DC

## 2012-07-28 NOTE — Telephone Encounter (Signed)
Contacted [facility] @ [phone] to discontinue refills currently on file. Patient brought printed RX for Vyvanse and Xanax to office with request for 90 day RXs.  Shredded printed RXs for: Vyvanse written 06/01/12-fill after 07/29/12 and Xanax written 06/01/12 w/5 refills. Contacted pt to notify him 90 day printed RX available for pick up at the office. New pharmacy [facility], [address] of [address]

## 2012-07-28 NOTE — Telephone Encounter (Signed)
07/28/12 3:04PM PT PICK-UP RX SCRIPT./SH

## 2012-10-01 ENCOUNTER — Ambulatory Visit (INDEPENDENT_AMBULATORY_CARE_PROVIDER_SITE_OTHER): Payer: Managed Care, Other (non HMO) | Admitting: Physician Assistant

## 2012-10-01 DIAGNOSIS — F411 Generalized anxiety disorder: Secondary | ICD-10-CM

## 2012-10-01 DIAGNOSIS — F988 Other specified behavioral and emotional disorders with onset usually occurring in childhood and adolescence: Secondary | ICD-10-CM

## 2012-10-01 DIAGNOSIS — F330 Major depressive disorder, recurrent, mild: Secondary | ICD-10-CM

## 2012-10-04 ENCOUNTER — Other Ambulatory Visit (HOSPITAL_COMMUNITY): Payer: Self-pay | Admitting: Physician Assistant

## 2012-10-05 ENCOUNTER — Telehealth (HOSPITAL_COMMUNITY): Payer: Self-pay

## 2012-10-06 ENCOUNTER — Other Ambulatory Visit (HOSPITAL_COMMUNITY): Payer: Self-pay | Admitting: Physician Assistant

## 2012-10-06 MED ORDER — DULOXETINE HCL 30 MG PO CPEP: 90.0000 mg | ORAL_CAPSULE | Freq: Every day | ORAL | Status: DC

## 2012-10-06 NOTE — Telephone Encounter (Signed)
Insurance will only pay for 90 day supply. 

## 2012-10-19 NOTE — Progress Notes (Signed)
   [facility] Behavioral Health Follow-up Outpatient Visit  Lynne Righi 08/24/1946  Date: 10/01/2012   Subjective: Randy Peterson presents today to followup on his treatment for anxiety and depression. He reports that he is now on Medicare. He states that his mood is "pretty good." He complains that it is still difficult for him to get up in the morning. He states that he doesn't go to bed until 1 AM, but he is asleep in his recliner before that. He expresses some decrease in his level of motivation, but he denies that he is depressed or anxious. He reports he has tried to decrease his Xanax, and now is not taking any in the evening for a period of about one month. He denies any suicidal or homicidal ideation. He denies any auditory or visual hallucinations.  There were no vitals filed for this visit.  Mental Status Examination  Appearance: Casual Alert: Yes Attention: good  Cooperative: Yes Eye Contact: Good Speech: Clear and coherent Psychomotor Activity: Normal Memory/Concentration: Intact Oriented: person, place, time/date and situation Mood: Euthymic Affect: Congruent Thought Processes and Associations: Linear Fund of Knowledge: Good Thought Content: Normal Insight: Good Judgement: Good  Diagnosis: Maj. depressive disorder, recurrent, mild; generalized anxiety disorder  Treatment Plan: We will continue his Vyvanse at 70 mg daily, Wellbutrin XL 150 mg daily, Cymbalta 90 mg daily, and Xanax 1 mg each morning and 1 mg at bedtime. He will return for followup in 4 months.  Khalon Cansler, PA-C

## 2012-11-04 ENCOUNTER — Other Ambulatory Visit (HOSPITAL_COMMUNITY): Payer: Self-pay | Admitting: Physician Assistant

## 2013-02-01 ENCOUNTER — Encounter (HOSPITAL_COMMUNITY): Payer: Self-pay | Admitting: Physician Assistant

## 2013-02-01 ENCOUNTER — Ambulatory Visit (INDEPENDENT_AMBULATORY_CARE_PROVIDER_SITE_OTHER): Payer: Medicare Other | Admitting: Physician Assistant

## 2013-02-01 VITALS — BP 133/89 | HR 115 | Ht 66.0 in | Wt 207.4 lb

## 2013-02-01 DIAGNOSIS — F330 Major depressive disorder, recurrent, mild: Secondary | ICD-10-CM

## 2013-02-01 DIAGNOSIS — F988 Other specified behavioral and emotional disorders with onset usually occurring in childhood and adolescence: Secondary | ICD-10-CM

## 2013-02-01 DIAGNOSIS — F411 Generalized anxiety disorder: Secondary | ICD-10-CM

## 2013-02-01 MED ORDER — LISDEXAMFETAMINE DIMESYLATE 70 MG PO CAPS: 70.0000 mg | ORAL_CAPSULE | ORAL | Status: DC

## 2013-02-01 MED ORDER — DULOXETINE HCL 60 MG PO CPEP: 60.0000 mg | ORAL_CAPSULE | Freq: Every day | ORAL | Status: DC

## 2013-02-01 NOTE — Progress Notes (Signed)
[facility] Behavioral Health [id number] Progress Note  Randy Peterson [id number] 67 y.o.  02/01/2013 1:58 PM  Chief Complaint: Followup visit for anxiety, depression, and ADHD.  History of Present Illness: Randy Peterson reports that overall he is doing very well. He continues to take the Wellbutrin and Xanax every morning, and has been alternating Cymbalta and Vyvanse in the morning. He reports that if he does not take the Vyvanse he falls asleep on the sofa around 10 PM. He reports that he has not needed the Xanax at bedtime. His mood has been stable. He denies any significant anxiety. He reports that his sleep and appetite are good. He denies any suicidal or homicidal ideation. He denies any auditory or visual hallucinations.  Suicidal Ideation: No Plan Formed: No Patient has means to carry out plan: No  Homicidal Ideation: No Plan Formed: No Patient has means to carry out plan: No  Review of Systems: Psychiatric: Agitation: No Hallucination: No Depressed Mood: No Insomnia: No Hypersomnia: No Altered Concentration: No Feels Worthless: No Grandiose Ideas: No Belief In Special Powers: No New/Increased Substance Abuse: No Compulsions: No  Neurologic: Headache: No Seizure: No Paresthesias: No  Past Medical History: Acid reflux, enlarged prostate  Family History: Dementia Mother       Suicidality Brother       Depression Brother   Outpatient Encounter Prescriptions as of 02/01/2013  Medication Sig Dispense Refill  . ALPRAZolam (XANAX) 1 MG tablet Take 1 tablet in the morning Take 1/2 tablet in the evening Take 1 tablet at bedtime  225 tablet  0  . buPROPion (WELLBUTRIN XL) 150 MG 24 hr tablet TAKE 1 TABLET BY MOUTH EVERY MORNING  90 tablet  1  . DULoxetine (CYMBALTA) 60 MG capsule Take 1 capsule (60 mg total) by mouth daily.  90 capsule  1  . finasteride (PROSCAR) 5 MG tablet Take 5 mg by mouth daily.      Marland Kitchen lisdexamfetamine (VYVANSE) 70 MG capsule Take 1 capsule (70 mg total) by mouth  every morning.  30 capsule  0  . lisdexamfetamine (VYVANSE) 70 MG capsule Take 1 capsule (70 mg total) by mouth every morning.  30 capsule  0  . lisdexamfetamine (VYVANSE) 70 MG capsule Take 1 capsule (70 mg total) by mouth every morning.  90 capsule  0  . [DISCONTINUED] DULoxetine (CYMBALTA) 30 MG capsule Take 3 capsules (90 mg total) by mouth daily.  270 capsule  1  . [DISCONTINUED] lisdexamfetamine (VYVANSE) 70 MG capsule Take 1 capsule (70 mg total) by mouth every morning.  30 capsule  0  . [DISCONTINUED] lisdexamfetamine (VYVANSE) 70 MG capsule Take 1 capsule (70 mg total) by mouth every morning.  30 capsule  0  . [DISCONTINUED] lisdexamfetamine (VYVANSE) 70 MG capsule Take 1 capsule (70 mg total) by mouth every morning.  90 capsule  0   No facility-administered encounter medications on file as of 02/01/2013.    Past Psychiatric History/Hospitalization(s): Anxiety: Yes Bipolar Disorder: No Depression: Yes Mania: No Psychosis: No Schizophrenia: No Personality Disorder: No Hospitalization for psychiatric illness: Yes History of Electroconvulsive Shock Therapy: No Prior Suicide Attempts: Yes  Physical Exam: Constitutional:  BP 133/89  Pulse 115  Ht 5\' 6"  (1.676 m)  Wt 207 lb 6.4 oz (94.076 kg)  BMI 33.49 kg/m2  General Appearance: alert, oriented, no acute distress, well nourished, obese and well groomed and casually dressed  Musculoskeletal: Strength & Muscle Tone: within normal limits Gait & Station: normal Patient leans: N/A  Psychiatric: Speech (describe rate,  volume, coherence, spontaneity, and abnormalities if any): Clear and coherent with a regular rate and rhythm and normal volume  Thought Process (describe rate, content, abstract reasoning, and computation): Within normal limits  Associations: Intact  Thoughts: normal  Mental Status: Orientation: oriented to person, place, time/date and situation Mood & Affect: Euthymic mood and appropriate affect Attention  Span & Concentration: Intact  Medical Decision Making (Choose Three): Established Problem, Stable/Improving (1), Review of Last Therapy Session (1), Review of Medication Regimen & Side Effects (2) and Review of New Medication or Change in Dosage (2)  Assessment: Axis I: Maj. depressive disorder, recurrent, mild; generalized anxiety disorder; ADHD, inattentive to  Axis II: Deferred  Axis III: Acid reflux, enlarged prostate  Axis IV: Mild  Axis V: 80   Plan: We will decrease his dose of Cymbalta to 60 mg and encourage him to take it daily. We will continue the Wellbutrin XL 150 mg each morning. We will continue the Xanax 1 mg twice daily, and this Vyvanse 70 mg daily. He will return for followup in 4 months. He is encouraged to call between appointments if he feels the need.  [name], PA-C 02/01/2013

## 2013-02-18 ENCOUNTER — Other Ambulatory Visit (HOSPITAL_COMMUNITY): Payer: Self-pay | Admitting: Physician Assistant

## 2013-05-03 ENCOUNTER — Other Ambulatory Visit (HOSPITAL_COMMUNITY): Payer: Self-pay | Admitting: Physician Assistant

## 2013-05-03 DIAGNOSIS — F330 Major depressive disorder, recurrent, mild: Secondary | ICD-10-CM

## 2013-05-06 ENCOUNTER — Ambulatory Visit
Admission: RE | Admit: 2013-05-06 | Discharge: 2013-05-06 | Disposition: A | Discharge status: Home / Self Care | Payer: Self-pay | Source: Ambulatory Visit | Attending: Family Medicine | Admitting: Family Medicine

## 2013-05-06 ENCOUNTER — Other Ambulatory Visit: Payer: Self-pay | Admitting: Family Medicine

## 2013-05-06 DIAGNOSIS — T148XXA Other injury of unspecified body region, initial encounter: Secondary | ICD-10-CM

## 2013-06-08 ENCOUNTER — Ambulatory Visit (INDEPENDENT_AMBULATORY_CARE_PROVIDER_SITE_OTHER): Payer: Medicare Other | Admitting: Physician Assistant

## 2013-06-08 ENCOUNTER — Encounter (HOSPITAL_COMMUNITY): Payer: Self-pay | Admitting: Physician Assistant

## 2013-06-08 VITALS — BP 121/75 | HR 88 | Ht 66.0 in | Wt 205.4 lb

## 2013-06-08 DIAGNOSIS — F411 Generalized anxiety disorder: Secondary | ICD-10-CM

## 2013-06-08 DIAGNOSIS — F988 Other specified behavioral and emotional disorders with onset usually occurring in childhood and adolescence: Secondary | ICD-10-CM

## 2013-06-08 DIAGNOSIS — F330 Major depressive disorder, recurrent, mild: Secondary | ICD-10-CM

## 2013-06-08 MED ORDER — LISDEXAMFETAMINE DIMESYLATE 70 MG PO CAPS: 70.0000 mg | ORAL_CAPSULE | ORAL | Status: DC

## 2013-06-08 NOTE — Progress Notes (Signed)
   [facility] Behavioral Health Follow-up Outpatient Visit  Randy Peterson May 23, 1946  Date: 06/08/2013   Subjective: Randy Peterson presents today to followup on his treatment for depression, anxiety, and ADHD. He reports that overall he is doing well. He states that his anxiety is well managed. He reports that he is sleeping "almost too good." His mood is stable. He takes the Vyvanse on an as-needed basis. He has been taking the Cymbalta regularly at the reduced dose, and reports that it is working well and he denies side effects. He continues to take Xanax usually 1 mg each morning and then another one as needed. He is also taking his Wellbutrin as prescribed. He denies any suicidal or homicidal ideation. He denies any auditory or visual hallucinations.  Filed Vitals:   06/08/13 1355  BP: 121/75  Pulse: 88    Mental Status Examination  Appearance: Casual Alert: Yes Attention: good  Cooperative: Yes Eye Contact: Good Speech: Clear and coherent Psychomotor Activity: Normal Memory/Concentration: Intact Oriented: person, place, time/date and situation Mood: Euthymic Affect: Congruent Thought Processes and Associations: Linear Fund of Knowledge: Good Thought Content: Normal Insight: Good Judgement: Good  Diagnosis: Maj. depressive disorder, recurrent, mild; generalized anxiety disorder; ADHD, inattentive type  Treatment Plan: We will continue his Cymbalta 60 mg daily, Wellbutrin XL 150 mg daily, Xanax 1 mg twice daily, and Vyvanse 70 mg daily. He will return for followup in 4 months.  Kayci Belleville, PA-C

## 2013-06-28 ENCOUNTER — Other Ambulatory Visit (HOSPITAL_COMMUNITY): Payer: Self-pay | Admitting: Physician Assistant

## 2013-06-28 DIAGNOSIS — F411 Generalized anxiety disorder: Secondary | ICD-10-CM

## 2013-07-29 ENCOUNTER — Other Ambulatory Visit (HOSPITAL_COMMUNITY): Payer: Self-pay | Admitting: Physician Assistant

## 2013-07-29 DIAGNOSIS — F330 Major depressive disorder, recurrent, mild: Secondary | ICD-10-CM

## 2013-07-29 NOTE — Telephone Encounter (Signed)
Chart reviewed. Refill appropriate. 

## 2013-08-06 ENCOUNTER — Other Ambulatory Visit: Payer: Self-pay | Admitting: Family Medicine

## 2013-08-06 ENCOUNTER — Ambulatory Visit
Admission: RE | Admit: 2013-08-06 | Discharge: 2013-08-06 | Disposition: A | Discharge status: Home / Self Care | Payer: Medicare Other | Source: Ambulatory Visit | Attending: Family Medicine | Admitting: Family Medicine

## 2013-08-06 DIAGNOSIS — M25562 Pain in left knee: Secondary | ICD-10-CM

## 2013-08-13 ENCOUNTER — Other Ambulatory Visit (HOSPITAL_COMMUNITY): Payer: Self-pay | Admitting: Physician Assistant

## 2013-08-13 DIAGNOSIS — F330 Major depressive disorder, recurrent, mild: Secondary | ICD-10-CM

## 2013-08-16 NOTE — Telephone Encounter (Signed)
Chart reviewed - Refill appropriate Appt with Dr. Lolly Mustache 11/08/13

## 2013-08-18 ENCOUNTER — Other Ambulatory Visit: Payer: Self-pay | Admitting: Family Medicine

## 2013-08-18 DIAGNOSIS — M5416 Radiculopathy, lumbar region: Secondary | ICD-10-CM

## 2013-08-26 ENCOUNTER — Ambulatory Visit
Admission: RE | Admit: 2013-08-26 | Discharge: 2013-08-26 | Disposition: A | Discharge status: Home / Self Care | Payer: Medicare Other | Source: Ambulatory Visit | Attending: Family Medicine | Admitting: Family Medicine

## 2013-08-26 DIAGNOSIS — M5416 Radiculopathy, lumbar region: Secondary | ICD-10-CM

## 2013-10-27 ENCOUNTER — Other Ambulatory Visit (HOSPITAL_COMMUNITY): Payer: Self-pay | Admitting: Psychiatry

## 2013-10-31 ENCOUNTER — Other Ambulatory Visit (HOSPITAL_COMMUNITY): Payer: Self-pay | Admitting: Psychiatry

## 2013-10-31 NOTE — Telephone Encounter (Signed)
Needs to be seen

## 2013-11-01 ENCOUNTER — Other Ambulatory Visit (HOSPITAL_COMMUNITY): Payer: Self-pay | Admitting: Physician Assistant

## 2013-11-01 DIAGNOSIS — F411 Generalized anxiety disorder: Secondary | ICD-10-CM

## 2013-11-01 NOTE — Telephone Encounter (Signed)
Chart reviewed Refill appropriate Appointment with Dr. Adele Schilder 11/08/13

## 2013-11-08 ENCOUNTER — Encounter (HOSPITAL_COMMUNITY): Payer: Self-pay | Admitting: Psychiatry

## 2013-11-08 ENCOUNTER — Ambulatory Visit (INDEPENDENT_AMBULATORY_CARE_PROVIDER_SITE_OTHER): Payer: BC Managed Care – PPO | Admitting: Psychiatry

## 2013-11-08 ENCOUNTER — Ambulatory Visit (HOSPITAL_COMMUNITY): Payer: Self-pay | Admitting: Physician Assistant

## 2013-11-08 VITALS — BP 124/75 | HR 100 | Ht 66.0 in | Wt 206.0 lb

## 2013-11-08 DIAGNOSIS — F411 Generalized anxiety disorder: Secondary | ICD-10-CM

## 2013-11-08 DIAGNOSIS — F330 Major depressive disorder, recurrent, mild: Secondary | ICD-10-CM

## 2013-11-08 MED ORDER — BUPROPION HCL ER (XL) 300 MG PO TB24: ORAL_TABLET | ORAL | Status: DC

## 2013-11-08 MED ORDER — DULOXETINE HCL 30 MG PO CPEP: 30.0000 mg | ORAL_CAPSULE | Freq: Every day | ORAL | Status: DC
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
## 2013-11-18 ENCOUNTER — Encounter: Payer: Self-pay | Admitting: Cardiology

## 2013-11-29 ENCOUNTER — Other Ambulatory Visit (HOSPITAL_COMMUNITY): Payer: Self-pay | Admitting: Physician Assistant

## 2013-11-29 DIAGNOSIS — F411 Generalized anxiety disorder: Secondary | ICD-10-CM

## 2013-11-30 NOTE — Telephone Encounter (Signed)
Per Dr. Adele Schilder, as patient is only taking once a day, give #90 with directions to take once daily.

## 2013-12-07 ENCOUNTER — Ambulatory Visit (HOSPITAL_COMMUNITY): Payer: Self-pay | Admitting: Psychiatry

## 2013-12-08 ENCOUNTER — Encounter (HOSPITAL_COMMUNITY): Payer: Self-pay | Admitting: Psychiatry

## 2013-12-08 ENCOUNTER — Ambulatory Visit (INDEPENDENT_AMBULATORY_CARE_PROVIDER_SITE_OTHER): Payer: BC Managed Care – PPO | Admitting: Psychiatry

## 2013-12-08 VITALS — BP 120/76 | HR 93 | Ht 66.0 in | Wt 210.0 lb

## 2013-12-08 DIAGNOSIS — F411 Generalized anxiety disorder: Secondary | ICD-10-CM

## 2013-12-08 DIAGNOSIS — F330 Major depressive disorder, recurrent, mild: Secondary | ICD-10-CM

## 2013-12-08 MED ORDER — DULOXETINE HCL 30 MG PO CPEP: 30.0000 mg | ORAL_CAPSULE | Freq: Every day | ORAL | Status: DC

## 2013-12-08 MED ORDER — BUPROPION HCL ER (XL) 300 MG PO TB24: ORAL_TABLET | ORAL | Status: DC

## 2013-12-08 NOTE — Progress Notes (Signed)
Randy Peterson [phone] Progress Note  Randy Peterson [id number] 68 y.o.  12/08/2013 3:45 PM  Chief Complaint:  I'm doing better since we reduced the dosage of Cymbalta and stop taking Vyvanse.    History of Present Illness:  Randy Peterson came for his followup appointment.  On his last visit we have stopped his Vyvanse and reduce Cymbalta 30 mg daily.  Patient was complaining of excessive sedation with Cymbalta and he is unable to afford his Vyvanse.  Patient is also taking Xanax 1 mg half to one tablet at bedtime.  He is concerned because his insurance does not approved Xanax.  His thinking to cut down Xanax in the future so he does not have to take it.  He feels more social active and energetic.  He has gained some weight from the past but overall his energy level is good.  He is less sedated in the morning.  He gets tired in the evening but he enjoys good night sleep.  Patient denies any agitation, anger, mood swing.  He denies any suicidal thoughts or homicidal thoughts.  He received records from his primary care physician.  His last blood work which was done in November shows normal CBC and basic chemistry.  His TSH is normal.  His hemoglobin A1c is 5.6.  His total cholesterol was 193 and LDL is 123.  His PSA is 0.196.  Patient denies any paranoia or any hallucination.  He denies any crying spells.  He was to continue his current psychotropic medication.  He is hoping to get off from Xanax in the future.  Suicidal Ideation: No Plan Formed: No Patient has means to carry out plan: No  Homicidal Ideation: No Plan Formed: No Patient has means to carry out plan: No  Review of Systems: Psychiatric: Agitation: No Hallucination: No Depressed Mood: No Insomnia: No Hypersomnia: No Altered Concentration: No Feels Worthless: No Grandiose Ideas: No Belief In Special Powers: No New/Increased Substance Abuse: No Compulsions: No  Neurologic: Headache: No Seizure: No Paresthesias: No  Past  Medical History:  Patient see Dr. Deon Pilling.  He has history of Acid reflux, chronic back pain and enlarged prostate.   Family History:  Suicidality Brother, Depression Brother.  Social history. Patient lives with his wife.  He is retired.  He used to work at [employer] [place].  He has 65 year old daughter who lives close.  The patient has good support from his family.     Outpatient Encounter Prescriptions as of 12/08/2013  Medication Sig  . ALPRAZolam (XANAX) 1 MG tablet Take 1 tablet (1 mg total) by mouth daily as needed for anxiety.  Marland Kitchen buPROPion (WELLBUTRIN XL) 300 MG 24 hr tablet TAKE 1 TABLET BY MOUTH EVERY MORNING  . cholecalciferol (VITAMIN D) 1000 UNITS tablet Take 1,000 Units by mouth daily.  . DULoxetine (CYMBALTA) 30 MG capsule Take 1 capsule (30 mg total) by mouth daily.  . finasteride (PROSCAR) 5 MG tablet Take 5 mg by mouth daily.  . pantoprazole (PROTONIX) 20 MG tablet Take 20 mg by mouth daily.  . ranitidine (ZANTAC) 150 MG tablet Take 150 mg by mouth 2 (two) times daily.  . tamsulosin (FLOMAX) 0.4 MG CAPS capsule Take 0.4 mg by mouth.  . [DISCONTINUED] buPROPion (WELLBUTRIN XL) 300 MG 24 hr tablet TAKE 1 TABLET BY MOUTH EVERY MORNING  . [DISCONTINUED] DULoxetine (CYMBALTA) 30 MG capsule Take 1 capsule (30 mg total) by mouth daily.    Past Psychiatric History/Hospitalization(s): The patient has history of suicidal  attempt in 2008 when he cut his wrist and requires inpatient psychiatric treatment.  At that time he had a stress because of his job and finances.  In the past he has given Paxil and Risperdal.  Physician assistant has given her Vyvanse to help his attention and concentration.  Patient never had psychological testing done.  Patient does not recall any mania, psychosis or any hallucination.   Anxiety: Yes Bipolar Disorder: No Depression: Yes Mania: No Psychosis: No Schizophrenia: No Personality Disorder: No Hospitalization for psychiatric illness:  Yes History of Electroconvulsive Shock Therapy: No Prior Suicide Attempts: Yes  Physical Exam: Constitutional:  BP 120/76  Pulse 93  Ht 5\' 6"  (1.676 m)  Wt 210 lb (95.255 kg)  BMI 33.91 kg/m2  General Appearance: alert, oriented, no acute distress, well nourished, obese and well groomed and casually dressed  Musculoskeletal: Strength & Muscle Tone: within normal limits Gait & Station: normal Patient leans: N/A  Mental Status Examination; Patient is casually dressed and fairly groomed.  He is calm and cooperative.  His speech is fluent, clear and coherent.  His thought processes logical and goal directed.  He denies any auditory or visual hallucination.  He denies any active or passive suicidal parts or homicidal thought.  There were no delusions or any paranoia present.  There were no flight of ideas or any loose association.  His fund of knowledge is good.  He has no tremors or shakes.  His attention and concentration is good.  His immediate and recent memory is intact.  He is alert and oriented x3.  His psychomotor activity is normal.  His insight judgment and impulse control is okay.  Established Problem, Stable/Improving (1), Review of Psycho-Social Stressors (1), Decision to obtain old records (1), Review and summation of old records (2), Review of Last Therapy Session (1), Review of Medication Regimen & Side Effects (2) and Review of New Medication or Change in Dosage (2)  Assessment: Axis I: Maj. depressive disorder, recurrent, mild;   Axis II: Deferred  Axis III: Acid reflux, enlarged prostate, chronic back pain  Axis IV: Mild  Axis V: 80   Plan:  I review his symptoms, collateral information from his primary care physician and recent changes in his medication.  The patient is doing better without any stimulant.  His compliance with Wellbutrin XL 300 mg daily and Cymbalta 300 mg daily.  We will provide Xanax 1 mg at bedtime however he is taking half to 1 tablet and like  to come off in the future.  Recommended to call us back if he has any question or any concern.  Followup in 3 months.  Patient is not interested in counseling at this time.  ime spent 25 minutes.  More than 50% of the time spent in psychoeducation, counseling and coordination of care.  Discuss safety plan that anytime having active suicidal thoughts or homicidal thoughts then patient need to call 911 or go to the local emergency room.  Alfretta Pinch T., MD 12/08/2013

## 2014-01-25 ENCOUNTER — Telehealth (HOSPITAL_COMMUNITY): Payer: Self-pay

## 2014-01-27 ENCOUNTER — Other Ambulatory Visit (HOSPITAL_COMMUNITY): Payer: Self-pay | Admitting: Psychiatry

## 2014-01-27 ENCOUNTER — Telehealth (HOSPITAL_COMMUNITY): Payer: Self-pay

## 2014-01-27 DIAGNOSIS — F411 Generalized anxiety disorder: Secondary | ICD-10-CM

## 2014-01-27 MED ORDER — ALPRAZOLAM 1 MG PO TABS: 1.0000 mg | ORAL_TABLET | Freq: Every day | ORAL | Status: DC | PRN

## 2014-01-27 NOTE — Telephone Encounter (Signed)
Xanax 1 mg #90 is given as a hard copy so he can fill the prescription where he can get less expensive.

## 2014-01-27 NOTE — Telephone Encounter (Signed)
Pt left VM @ 0808 01/27/14:Requesting refill of Xanax, but states insurance won't pay.Asking if he can get a prescription because he wants to use another drug store. Left message for pt on5/15/15 at  1123 @ [phone]--Asked pt to call office and clarify request for refill of Xanax prescription:Does he want it called to different pharmacy than the one on file or does he want a paper prescription to hand carry to pharmacy?

## 2014-01-27 NOTE — Telephone Encounter (Signed)
Patient states his insurance will not pay for Xanax,has to pay $100 out of pocket now.Only got #30 of last RX for #[facility] at [facility] due to cost[verified with [facility]].Found that [facility] will fill for $20.Wanted RX to take to [facility].Advised him [facility] may be able to transfer prescription and he may not need a new one at this time.He states he will talk to pharmacies first and call back if new RX is needed.

## 2014-03-07 ENCOUNTER — Other Ambulatory Visit (HOSPITAL_COMMUNITY): Payer: Self-pay | Admitting: Psychiatry

## 2014-03-10 ENCOUNTER — Encounter (HOSPITAL_COMMUNITY): Payer: Self-pay | Admitting: Psychiatry

## 2014-03-10 ENCOUNTER — Ambulatory Visit (INDEPENDENT_AMBULATORY_CARE_PROVIDER_SITE_OTHER): Payer: BC Managed Care – PPO | Admitting: Psychiatry

## 2014-03-10 VITALS — BP 128/71 | HR 90 | Ht 66.0 in | Wt 209.0 lb

## 2014-03-10 DIAGNOSIS — F411 Generalized anxiety disorder: Secondary | ICD-10-CM

## 2014-03-10 DIAGNOSIS — F330 Major depressive disorder, recurrent, mild: Secondary | ICD-10-CM

## 2014-03-10 MED ORDER — ALPRAZOLAM 1 MG PO TABS: 1.0000 mg | ORAL_TABLET | Freq: Every day | ORAL | Status: DC | PRN

## 2014-03-10 MED ORDER — DULOXETINE HCL 30 MG PO CPEP: 30.0000 mg | ORAL_CAPSULE | Freq: Every day | ORAL | Status: DC

## 2014-03-10 MED ORDER — BUPROPION HCL ER (XL) 300 MG PO TB24: ORAL_TABLET | ORAL | Status: DC

## 2014-03-10 NOTE — Progress Notes (Signed)
[place] [phone] Progress Note  Randy Peterson [id number] 68 y.o.  03/10/2014 2:54 PM  Chief Complaint:  Medication management and followup.      History of Present Illness:  Randy Peterson came for his followup appointment.  He is compliant with his psychotropic medication.  He denies any irritability, anger, mood swing.  He does not like very hot weather and recently he is feeling exhausted and endorse decreased energy.  However his mood has been good.  He is sleeping good.  He is working as a Psychologist, occupational sometime at [employer] .  He is taking Cymbalta 30 mg daily and Wellbutrin XL 300 mg daily.  He is able to find Xanax less cheaper at [facility].  He is not drinking alcohol or using any illegal substances.  He never asked for early because of this benzodiazepine.  He denied any feeling of hopelessness or worthlessness.  His appetite is okay.  His vitals are stable.  He lives with his wife.  He has grown daughter who lives close by.  Suicidal Ideation: No Plan Formed: No Patient has means to carry out plan: No  Homicidal Ideation: No Plan Formed: No Patient has means to carry out plan: No  Review of Systems: Psychiatric: Agitation: No Hallucination: No Depressed Mood: No Insomnia: No Hypersomnia: No Altered Concentration: No Feels Worthless: No Grandiose Ideas: No Belief In Special Powers: No New/Increased Substance Abuse: No Compulsions: No  Neurologic: Headache: No Seizure: No Paresthesias: No  Past Medical History:  Patient see Dr. Deon Pilling.  He has history of Acid reflux, chronic back pain and enlarged prostate.   Outpatient Encounter Prescriptions as of 03/10/2014  Medication Sig  . ALPRAZolam (XANAX) 1 MG tablet Take 1 tablet (1 mg total) by mouth daily as needed for anxiety.  Marland Kitchen buPROPion (WELLBUTRIN XL) 300 MG 24 hr tablet TAKE 1 TABLET BY MOUTH EVERY MORNING  . cholecalciferol (VITAMIN D) 1000 UNITS tablet Take 1,000 Units by mouth daily.  .  DULoxetine (CYMBALTA) 30 MG capsule Take 1 capsule (30 mg total) by mouth daily.  . finasteride (PROSCAR) 5 MG tablet Take 5 mg by mouth daily.  . pantoprazole (PROTONIX) 20 MG tablet Take 20 mg by mouth daily.  . ranitidine (ZANTAC) 150 MG tablet Take 150 mg by mouth 2 (two) times daily.  . tamsulosin (FLOMAX) 0.4 MG CAPS capsule Take 0.4 mg by mouth.  . [DISCONTINUED] ALPRAZolam (XANAX) 1 MG tablet Take 1 tablet (1 mg total) by mouth daily as needed for anxiety.  . [DISCONTINUED] buPROPion (WELLBUTRIN XL) 300 MG 24 hr tablet TAKE 1 TABLET BY MOUTH EVERY MORNING  . [DISCONTINUED] DULoxetine (CYMBALTA) 30 MG capsule Take 1 capsule (30 mg total) by mouth daily.    Past Psychiatric History/Hospitalization(s): Patient has history of suicidal attempt in 2008 when he cut his wrist and requires inpatient psychiatric treatment.  At that time he had a stress because of his job and finances.  In the past he has given Paxil and Risperdal.  He was also given Vyvanse to help his attention and concentration however it was discontinued because of increased anxiety.  Patient never had psychological testing done.  Patient does not recall any mania, psychosis or any hallucination.   Anxiety: Yes Bipolar Disorder: No Depression: Yes Mania: No Psychosis: No Schizophrenia: No Personality Disorder: No Hospitalization for psychiatric illness: Yes History of Electroconvulsive Shock Therapy: No Prior Suicide Attempts: Yes  Physical Exam: Constitutional:  BP 128/71  Pulse 90  Ht 5\' 6"  (1.676  m)  Wt 209 lb (94.802 kg)  BMI 33.75 kg/m2  General Appearance: alert, oriented, no acute distress, well nourished, obese and well groomed and casually dressed  Musculoskeletal: Strength & Muscle Tone: within normal limits Gait & Station: normal Patient leans: N/A  Mental Status Examination; Patient is casually dressed and fairly groomed.  He is calm and cooperative.  His speech is fluent, clear and coherent.  His  thought processes logical and goal directed.  He denies any auditory or visual hallucination.  He denies any active or passive suicidal parts or homicidal thought.  There were no delusions or any paranoia present.  There were no flight of ideas or any loose association.  His fund of knowledge is good.  He has no tremors or shakes.  His attention and concentration is good.  His immediate and recent memory is intact.  He is alert and oriented x3.  His psychomotor activity is normal.  His insight judgment and impulse control is okay.  Established Problem, Stable/Improving (1), Review of Last Therapy Session (1) and Review of Medication Regimen & Side Effects (2)  Assessment: Axis I: Maj. depressive disorder, recurrent, mild;   Axis II: Deferred  Axis III: Acid reflux, enlarged prostate, chronic back pain  Axis IV: Mild  Axis V: 80   Plan:  Patient is doing better on his current psychotropic medication.  I will continue Wellbutrin XL 300 mg daily, Xanax 1 mg at bedtime and Cymbalta 30 mg daily.  Discussed risk and benefits of medication. Recommended to call us back if he has any question or any concern.  Followup in 3 months.    ARFEEN,SYED T., MD 03/10/2014

## 2014-06-09 ENCOUNTER — Other Ambulatory Visit (HOSPITAL_COMMUNITY): Payer: Self-pay | Admitting: Psychiatry

## 2014-06-10 ENCOUNTER — Ambulatory Visit (HOSPITAL_COMMUNITY): Payer: Self-pay | Admitting: Psychiatry

## 2014-07-07 ENCOUNTER — Ambulatory Visit (INDEPENDENT_AMBULATORY_CARE_PROVIDER_SITE_OTHER): Payer: BC Managed Care – PPO | Admitting: Psychiatry

## 2014-07-07 ENCOUNTER — Encounter (HOSPITAL_COMMUNITY): Payer: Self-pay | Admitting: Psychiatry

## 2014-07-07 VITALS — BP 133/87 | HR 74 | Ht 66.0 in | Wt 208.6 lb

## 2014-07-07 DIAGNOSIS — F330 Major depressive disorder, recurrent, mild: Secondary | ICD-10-CM

## 2014-07-07 MED ORDER — DULOXETINE HCL 30 MG PO CPEP: 30.0000 mg | ORAL_CAPSULE | Freq: Every day | ORAL | Status: DC

## 2014-07-07 MED ORDER — BUPROPION HCL ER (XL) 300 MG PO TB24: 300.0000 mg | ORAL_TABLET | Freq: Every day | ORAL | Status: DC

## 2014-07-07 NOTE — Progress Notes (Signed)
[facility] [phone] Progress Note  Randy Peterson [id number] 68 y.o.  07/07/2014 2:20 PM  Chief Complaint:  Medication management and followup.      History of Present Illness:  Randy Peterson came for his followup appointment.  He is taking his medication as prescribed.  He denies any side effects.  He cut down his Xanax and sometime he takes half tablet in the morning.  He denies any major panic attack.  His depression is under control.  He denied any feeling of hopelessness or worthlessness.  He is taking Cymbalta 30 mg daily and Wellbutrin XL 300 mg in the morning.  His energy level is good.  His appetite is okay and his vitals are stable.  He is not drinking or using any illegal substances.  He lives with his wife and his daughter is living close by.  Suicidal Ideation: No Plan Formed: No Patient has means to carry out plan: No  Homicidal Ideation: No Plan Formed: No Patient has means to carry out plan: No  Review of Systems: Psychiatric: Agitation: No Hallucination: No Depressed Mood: No Insomnia: No Hypersomnia: No Altered Concentration: No Feels Worthless: No Grandiose Ideas: No Belief In Special Powers: No New/Increased Substance Abuse: No Compulsions: No  Neurologic: Headache: No Seizure: No Paresthesias: No  Past Medical History:  Patient see Dr. Deon Peterson.  He has history of Acid reflux, chronic back pain and enlarged prostate.   Outpatient Encounter Prescriptions as of 07/07/2014  Medication Sig  . ALPRAZolam (XANAX) 1 MG tablet Take 1 tablet (1 mg total) by mouth daily as needed for anxiety.  Marland Kitchen buPROPion (WELLBUTRIN XL) 300 MG 24 hr tablet Take 1 tablet (300 mg total) by mouth daily.  . cholecalciferol (VITAMIN D) 1000 UNITS tablet Take 1,000 Units by mouth daily.  . DULoxetine (CYMBALTA) 30 MG capsule Take 1 capsule (30 mg total) by mouth daily.  . finasteride (PROSCAR) 5 MG tablet Take 5 mg by mouth daily.  . pantoprazole (PROTONIX) 20 MG  tablet Take 20 mg by mouth daily.  . ranitidine (ZANTAC) 150 MG tablet Take 150 mg by mouth 2 (two) times daily.  . tamsulosin (FLOMAX) 0.4 MG CAPS capsule Take 0.4 mg by mouth.  . [DISCONTINUED] buPROPion (WELLBUTRIN XL) 300 MG 24 hr tablet TAKE 1 TABLET BY MOUTH EVERY MORNING  . [DISCONTINUED] DULoxetine (CYMBALTA) 30 MG capsule TAKE 1 CAPSULE (30 MG TOTAL) BY MOUTH DAILY.    Past Psychiatric History/Hospitalization(s): Patient has history of suicidal attempt in 2008 when he cut his wrist and requires inpatient psychiatric treatment.  At that time he had a stress because of his job and finances.  In the past he has given Paxil and Risperdal.  He was also given Vyvanse to help his attention and concentration however it was discontinued because of increased anxiety.  Patient never had psychological testing done.  Patient does not recall any mania, psychosis or any hallucination.   Anxiety: Yes Bipolar Disorder: No Depression: Yes Mania: No Psychosis: No Schizophrenia: No Personality Disorder: No Hospitalization for psychiatric illness: Yes History of Electroconvulsive Shock Therapy: No Prior Suicide Attempts: Yes  Physical Exam: Constitutional:  BP 133/87  Pulse 74  Ht 5\' 6"  (1.676 m)  Wt 208 lb 9.6 oz (94.62 kg)  BMI 33.68 kg/m2  General Appearance: alert, oriented, no acute distress, well nourished, obese and well groomed and casually dressed  Musculoskeletal: Strength & Muscle Tone: within normal limits Gait & Station: normal Patient leans: N/A  Mental Status Examination; Patient  is casually dressed and fairly groomed.  He is calm and cooperative.  His speech is fluent, clear and coherent.  His thought processes logical and goal directed.  He denies any auditory or visual hallucination.  He denies any active or passive suicidal parts or homicidal thought.  There were no delusions or any paranoia present.  There were no flight of ideas or any loose association.  His fund of  knowledge is good.  He has no tremors or shakes.  His attention and concentration is good.  His immediate and recent memory is intact.  He is alert and oriented x3.  His psychomotor activity is normal.  His insight judgment and impulse control is okay.  Established Problem, Stable/Improving (1), Review of Last Therapy Session (1) and Review of Medication Regimen & Side Effects (2)  Assessment: Axis I: Maj. depressive disorder, recurrent, mild;   Axis II: Deferred  Axis III: Acid reflux, enlarged prostate, chronic back pain  Axis IV: Mild  Axis V: 80   Plan:  Patient is doing better on his current psychotropic medication.  I will continue Wellbutrin XL 300 mg daily, Xanax 1 mg at bedtime and Cymbalta 30 mg daily.  Discussed risk and benefits of medication. Recommended to call us back if he has any question or any concern.  Followup in 3 months.    Randy Peterson T., MD 07/07/2014

## 2014-08-31 ENCOUNTER — Other Ambulatory Visit (HOSPITAL_COMMUNITY): Payer: Self-pay | Admitting: *Deleted

## 2014-08-31 DIAGNOSIS — F411 Generalized anxiety disorder: Secondary | ICD-10-CM

## 2014-08-31 MED ORDER — ALPRAZOLAM 1 MG PO TABS: 1.0000 mg | ORAL_TABLET | Freq: Every day | ORAL | Status: DC | PRN

## 2014-10-06 ENCOUNTER — Encounter (HOSPITAL_COMMUNITY): Payer: Self-pay | Admitting: Psychiatry

## 2014-10-06 ENCOUNTER — Ambulatory Visit (INDEPENDENT_AMBULATORY_CARE_PROVIDER_SITE_OTHER): Payer: Medicare Other | Admitting: Psychiatry

## 2014-10-06 VITALS — BP 140/83 | HR 92 | Ht 66.0 in | Wt 212.2 lb

## 2014-10-06 DIAGNOSIS — F330 Major depressive disorder, recurrent, mild: Secondary | ICD-10-CM

## 2014-10-06 MED ORDER — BUPROPION HCL ER (XL) 300 MG PO TB24: 300.0000 mg | ORAL_TABLET | Freq: Every day | ORAL | Status: DC

## 2014-10-06 MED ORDER — DULOXETINE HCL 30 MG PO CPEP: 30.0000 mg | ORAL_CAPSULE | Freq: Every day | ORAL | Status: DC

## 2014-10-06 NOTE — Progress Notes (Signed)
[place] [id number] Progress Note  Randy Peterson [id number] 69 y.o.  10/06/2014 3:28 PM  Chief Complaint:  Medication management and followup.      History of Present Illness:  Randy Peterson came for his followup appointment.  He is taking Cymbalta and Wellbutrin and Xanax .  Sometime he takes half Xanax .  He has noticing some nights he is sleeping early.  He likes his medication.  He denies any irritability, crying spells or any feeling of hopelessness or worthlessness.  He had a good Christmas.  He has no shakes or tremors.  His energy level is good.  Recently he has physical and there has been known new medication added.  Patient denies drinking or using any illegal substances.  His appetite is okay.  His vitals are stable.  He lives with his wife and his daughter is living close by.  Suicidal Ideation: No Plan Formed: No Patient has means to carry out plan: No  Homicidal Ideation: No Plan Formed: No Patient has means to carry out plan: No  Review of Systems: Psychiatric: Agitation: No Hallucination: No Depressed Mood: No Insomnia: No Hypersomnia: No Altered Concentration: No Feels Worthless: No Grandiose Ideas: No Belief In Special Powers: No New/Increased Substance Abuse: No Compulsions: No  Neurologic: Headache: No Seizure: No Paresthesias: No  Past Medical History:  Patient see Dr. Deon Pilling.  He has history of Acid reflux, chronic back pain and enlarged prostate.   Outpatient Encounter Prescriptions as of 10/06/2014  Medication Sig  . ALPRAZolam (XANAX) 1 MG tablet Take 1 tablet (1 mg total) by mouth daily as needed for anxiety.  Marland Kitchen buPROPion (WELLBUTRIN XL) 300 MG 24 hr tablet Take 1 tablet (300 mg total) by mouth daily.  . cholecalciferol (VITAMIN D) 1000 UNITS tablet Take 1,000 Units by mouth daily.  . DULoxetine (CYMBALTA) 30 MG capsule Take 1 capsule (30 mg total) by mouth daily.  . [DISCONTINUED] buPROPion (WELLBUTRIN XL) 300 MG 24 hr tablet Take  1 tablet (300 mg total) by mouth daily.  . [DISCONTINUED] DULoxetine (CYMBALTA) 30 MG capsule Take 1 capsule (30 mg total) by mouth daily.  . finasteride (PROSCAR) 5 MG tablet Take 5 mg by mouth daily.  . pantoprazole (PROTONIX) 20 MG tablet Take 20 mg by mouth daily.  . ranitidine (ZANTAC) 150 MG tablet Take 150 mg by mouth 2 (two) times daily.  . tamsulosin (FLOMAX) 0.4 MG CAPS capsule Take 0.4 mg by mouth.    Past Psychiatric History/Hospitalization(s): Patient has history of suicidal attempt in 2008 when he cut his wrist and requires inpatient psychiatric treatment.  At that time he had a stress because of his job and finances.  In the past he has given Paxil and Risperdal.  He was also given Vyvanse to help his attention and concentration however it was discontinued because of increased anxiety.  Patient never had psychological testing. Patient does not recall any mania, psychosis or any hallucination.   Anxiety: Yes Bipolar Disorder: No Depression: Yes Mania: No Psychosis: No Schizophrenia: No Personality Disorder: No Hospitalization for psychiatric illness: Yes History of Electroconvulsive Shock Therapy: No Prior Suicide Attempts: Yes  Physical Exam: Constitutional:  BP 140/83 mmHg  Pulse 92  Ht 5\' 6"  (1.676 m)  Wt 212 lb 3.2 oz (96.253 kg)  BMI 34.27 kg/m2  General Appearance: alert, oriented, no acute distress, well nourished, obese and well groomed and casually dressed  Musculoskeletal: Strength & Muscle Tone: within normal limits Gait & Station: normal Patient leans: N/A  Mental Status Examination; Patient is casually dressed and fairly groomed.  He is calm and cooperative.  His speech is clear and coherent.  His thought process is logical and goal directed.  He denies any auditory or visual hallucination.  He denies any active or passive suicidal thoughts or homicidal thought.  There were no delusions or any paranoia present.  There were no flight of ideas or any  loose association.  His fund of knowledge is good.  He has no tremors or shakes.  His attention and concentration is good.  His immediate and recent memory is intact.  He is alert and oriented x3.  His psychomotor activity is normal.  His insight judgment and impulse control is okay.  Established Problem, Stable/Improving (1), Review of Last Therapy Session (1) and Review of Medication Regimen & Side Effects (2)  Assessment: Axis I: Maj. depressive disorder, recurrent, mild;   Axis II: Deferred  Axis III: Acid reflux, enlarged prostate, chronic back pain  Axis IV: Mild  Axis V: 80   Plan:  Patient is doing better on his current psychotropic medication.  Recommended to use half senna if needed .  Recommended to continue Wellbutrin XL 300 mg daily and Cymbalta 30 mg daily.  Discussed risk and benefits of medication. Recommended to call us back if he has any question or any concern.  Followup in 3 months.    Chayne Baumgart T., MD 10/06/2014

## 2015-01-02 ENCOUNTER — Other Ambulatory Visit (HOSPITAL_COMMUNITY): Payer: Self-pay | Admitting: Psychiatry

## 2015-01-02 ENCOUNTER — Ambulatory Visit (HOSPITAL_COMMUNITY): Payer: Self-pay | Admitting: Psychiatry

## 2015-01-04 NOTE — Telephone Encounter (Signed)
Medication refill requests received for patient's bupropion and duloxetine.  Patient was scheduled for 01/02/15 but had to be rescheduled due to MD schedule and then patient could not keep 01/05/15 appointment.  Patient is now scheduled for 01/16/15 but needs refills authorized for both medications due to last [facility] [address].  [facility]ill requests are from [facility].

## 2015-01-05 ENCOUNTER — Ambulatory Visit (HOSPITAL_COMMUNITY): Payer: Self-pay | Admitting: Psychiatry

## 2015-01-05 ENCOUNTER — Other Ambulatory Visit (HOSPITAL_COMMUNITY): Payer: Self-pay

## 2015-01-05 ENCOUNTER — Other Ambulatory Visit: Payer: Self-pay | Admitting: Family Medicine

## 2015-01-05 ENCOUNTER — Ambulatory Visit
Admission: RE | Admit: 2015-01-05 | Discharge: 2015-01-05 | Disposition: A | Discharge status: Home / Self Care | Payer: Self-pay | Source: Ambulatory Visit | Attending: Family Medicine | Admitting: Family Medicine

## 2015-01-05 DIAGNOSIS — M25511 Pain in right shoulder: Secondary | ICD-10-CM

## 2015-01-05 DIAGNOSIS — F411 Generalized anxiety disorder: Secondary | ICD-10-CM

## 2015-01-05 NOTE — Telephone Encounter (Signed)
Will do!

## 2015-01-05 NOTE — Telephone Encounter (Signed)
Telephone message from patient requesting a refill of his past prescribed Alprazolam.  Patient had to be rescheduled from 01/02/15, could not keep appointment for 01/05/15 and is rescheduled for 01/16/15.  Patient had new orders 90 days orders of Wellbutrin and Cymbalta e-scribed in on 01/05/15 but now requests the refill of Alprazolam as will not have enough until appointment 01/16/15.  Patient requests new order be sent to [facility] in [place].  Called patient to verify the order is needed and he does not have enough until 01/16/15 appointment.

## 2015-01-06 ENCOUNTER — Other Ambulatory Visit (HOSPITAL_COMMUNITY): Payer: Self-pay | Admitting: Psychiatry

## 2015-01-06 MED ORDER — ALPRAZOLAM 1 MG PO TABS: 1.0000 mg | ORAL_TABLET | Freq: Every day | ORAL | Status: DC | PRN

## 2015-01-06 NOTE — Telephone Encounter (Signed)
Met with Dr. Adele Schilder who authorized a 90 day refill of patient's prescribed Alprazolam.  Called in order to [facility] on [facility] and then called patient back to inform order had been placed per authorization of Dr. Adele Schilder.   Mr. Haskett reported he would call back if any more problems obtaining the order this date and reminded of his need to keep his appointment with Dr. Adele Schilder on 01/16/15.

## 2015-01-06 NOTE — Telephone Encounter (Signed)
Ok to refill 

## 2015-01-16 ENCOUNTER — Encounter (HOSPITAL_COMMUNITY): Payer: Self-pay | Admitting: Psychiatry

## 2015-01-16 ENCOUNTER — Ambulatory Visit (INDEPENDENT_AMBULATORY_CARE_PROVIDER_SITE_OTHER): Payer: Medicare Other | Admitting: Psychiatry

## 2015-01-16 VITALS — BP 137/84 | HR 97 | Ht 66.0 in | Wt 204.0 lb

## 2015-01-16 DIAGNOSIS — F330 Major depressive disorder, recurrent, mild: Secondary | ICD-10-CM

## 2015-01-16 NOTE — Progress Notes (Signed)
[place] Progress Note  Randy Peterson [id number] 69 y.o.  01/16/2015 11:41 AM  Chief Complaint:  Medication management and followup.      History of Present Illness:  Randy Peterson came for his followup appointment.   He is complying the Cymbalta, Wellbutrin and Xanax.  Sometimes he does not take Xanax 1 mg at bedtime and takes half as needed.  Overall his anxiety depression is stable.  He denies any irritability, anger, mood swing.  His sleep is good.  His energy level is good. He is more active social denies any feeling of hopelessness or worthlessness.  He has no side effects of medication.  He denies any drinking or using any illegal substances.  His vitals are okay.  His appetite is okay. Patient lives with his wife. His daughter lives close by.  Suicidal Ideation: No Plan Formed: No Patient has means to carry out plan: No  Homicidal Ideation: No Plan Formed: No Patient has means to carry out plan: No  Review of Systems: Psychiatric: Agitation: No Hallucination: No Depressed Mood: No Insomnia: No Hypersomnia: No Altered Concentration: No Feels Worthless: No Grandiose Ideas: No Belief In Special Powers: No New/Increased Substance Abuse: No Compulsions: No  Neurologic: Headache: No Seizure: No Paresthesias: No  Past Medical History:  Patient see Dr. Deon Pilling.  He has history of Acid reflux, chronic back pain and enlarged prostate.   Outpatient Encounter Prescriptions as of 01/16/2015  . Order #: [id number] Class: Phone In  . Order #: [id number] Class: Normal  . Order #: [id number] Class: Historical Med  . Order #: [id number] Class: Normal  . Order #: [id number] Class: Historical Med  . Order #: [id number] Class: Historical Med  . Order #: [id number] Class: Historical Med  . Order #: [id number] Class: Historical Med    Past Psychiatric History/Hospitalization(s): Patient has history  Inpatient in 2008 due to suicidal attempt and require inpatient  services. He cut his wrist.  At that time he had a stress because of his job and finances.  In the past he has given Paxil and Risperdal.  He was also given Vyvanse to help his attention and concentration however it was discontinued because of increased anxiety.  Patient never had psychological testing. Patient does not recall any mania, psychosis or any hallucination.   Anxiety: Yes Bipolar Disorder: No Depression: Yes Mania: No Psychosis: No Schizophrenia: No Personality Disorder: No Hospitalization for psychiatric illness: Yes History of Electroconvulsive Shock Therapy: No Prior Suicide Attempts: Yes  Physical Exam: Constitutional:  BP 137/84 mmHg  Pulse 97  Ht 5\' 6"  (1.676 m)  Wt 204 lb (92.534 kg)  BMI 32.94 kg/m2  General Appearance: alert, oriented, no acute distress, well nourished, obese and well groomed and casually dressed  Musculoskeletal: Strength & Muscle Tone: within normal limits Gait & Station: normal Patient leans: N/A  Mental Status Examination; Patient is casually dressed and fairly groomed.  He is calm and cooperative.  His speech is clear and coherent.  His thought process is logical and goal directed.  He denies any auditory or visual hallucination.  He denies any active or passive suicidal thoughts or homicidal thought.  There were no delusions or any paranoia present.  There were no flight of ideas or any loose association.  His fund of knowledge is good.  He has no tremors or shakes.  His attention and concentration is good.  His immediate and recent memory is intact.  He is alert and oriented x3.  His  psychomotor activity is normal.  His insight judgment and impulse control is okay.  Established Problem, Stable/Improving (1), Review of Last Therapy Session (1) and Review of Medication Regimen & Side Effects (2)  Assessment: Axis I: Maj. depressive disorder, recurrent, mild;   Axis II: Deferred  Axis III: Acid reflux, enlarged prostate, chronic back  pain   Plan:  Patient is doing better on his current psychotropic medication. He  Does not ask for early refills of Xanax.  I will continue Wellbutrin XL 300 mg daily and Cymbalta 30 mg daily. Continue Xanax 1 mg half to one tablet as needed for insomnia and anxiety.  Discussed medication side effects and benefits. Recommended to call us back if he has any question or any concern.  Followup in 3 months.    Tobin Witucki T., MD 01/16/2015

## 2015-03-31 ENCOUNTER — Other Ambulatory Visit (HOSPITAL_COMMUNITY): Payer: Self-pay | Admitting: Psychiatry

## 2015-03-31 DIAGNOSIS — F330 Major depressive disorder, recurrent, mild: Secondary | ICD-10-CM

## 2015-04-04 ENCOUNTER — Other Ambulatory Visit (HOSPITAL_COMMUNITY): Payer: Self-pay | Admitting: Psychiatry

## 2015-04-04 NOTE — Telephone Encounter (Signed)
New 90 day orders of patient's prescribed Cymbalta and Wellbutrin XL authorized by Dr. Adele Schilder and [facility]. Patient to return to see Dr. Adele Schilder on 05/04/15.

## 2015-04-19 ENCOUNTER — Ambulatory Visit (HOSPITAL_COMMUNITY): Payer: Self-pay | Admitting: Psychiatry

## 2015-04-19 IMAGING — CR DG LUMBAR SPINE COMPLETE 4+V
5 series · 5 of 5 positions shown · non-contrast
Comparison: None.

CLINICAL DATA: Low back pain.

EXAM:
LUMBAR SPINE - COMPLETE 4+ VIEW

[view not recorded (1 of 5)]
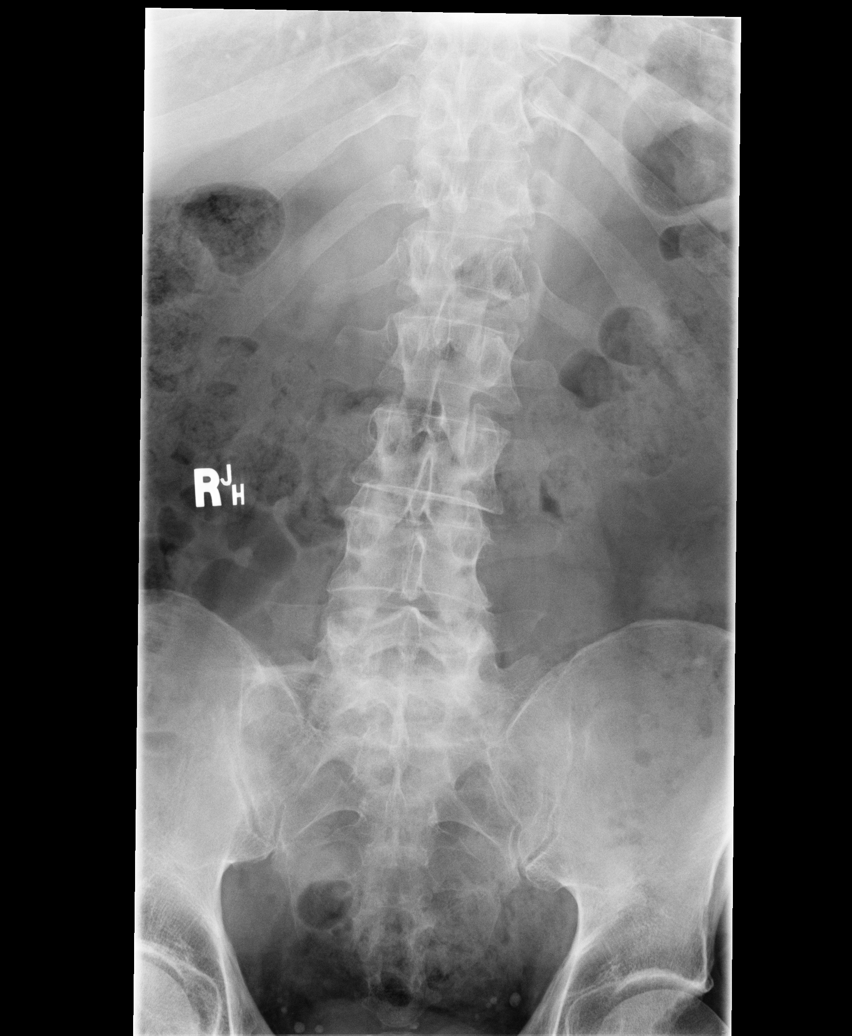

[view not recorded (2 of 5)]
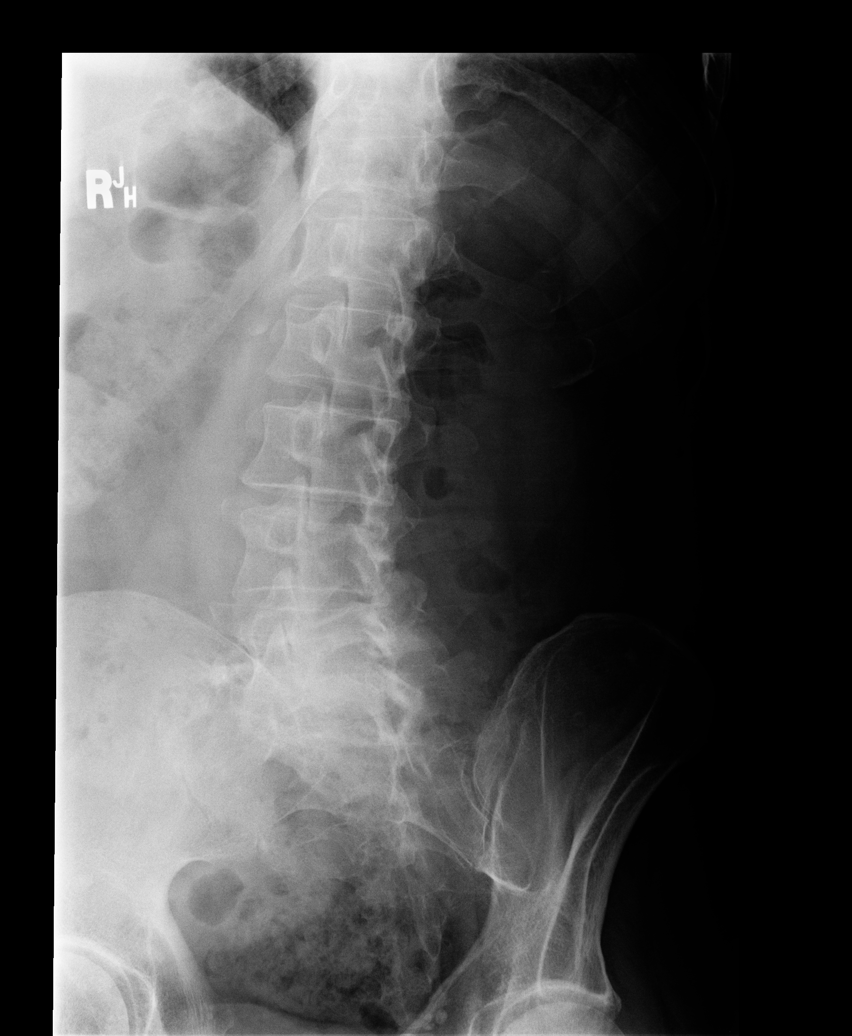

[view not recorded (3 of 5)]
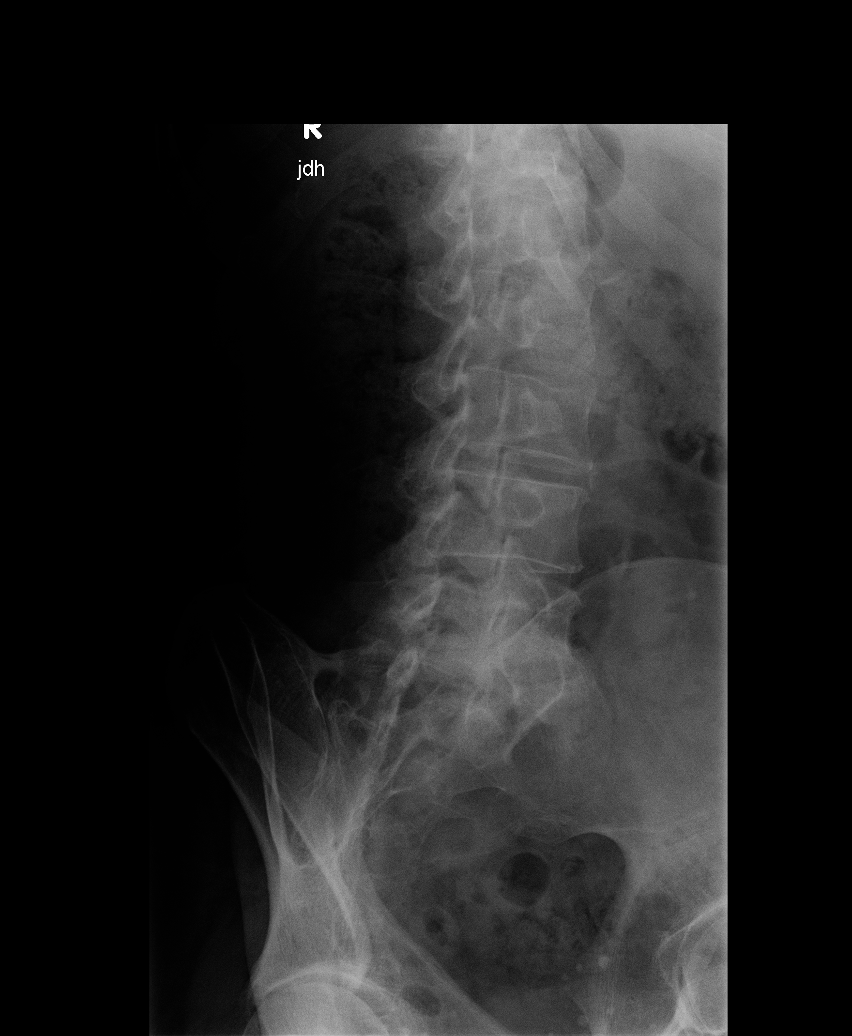

[view not recorded (4 of 5)]
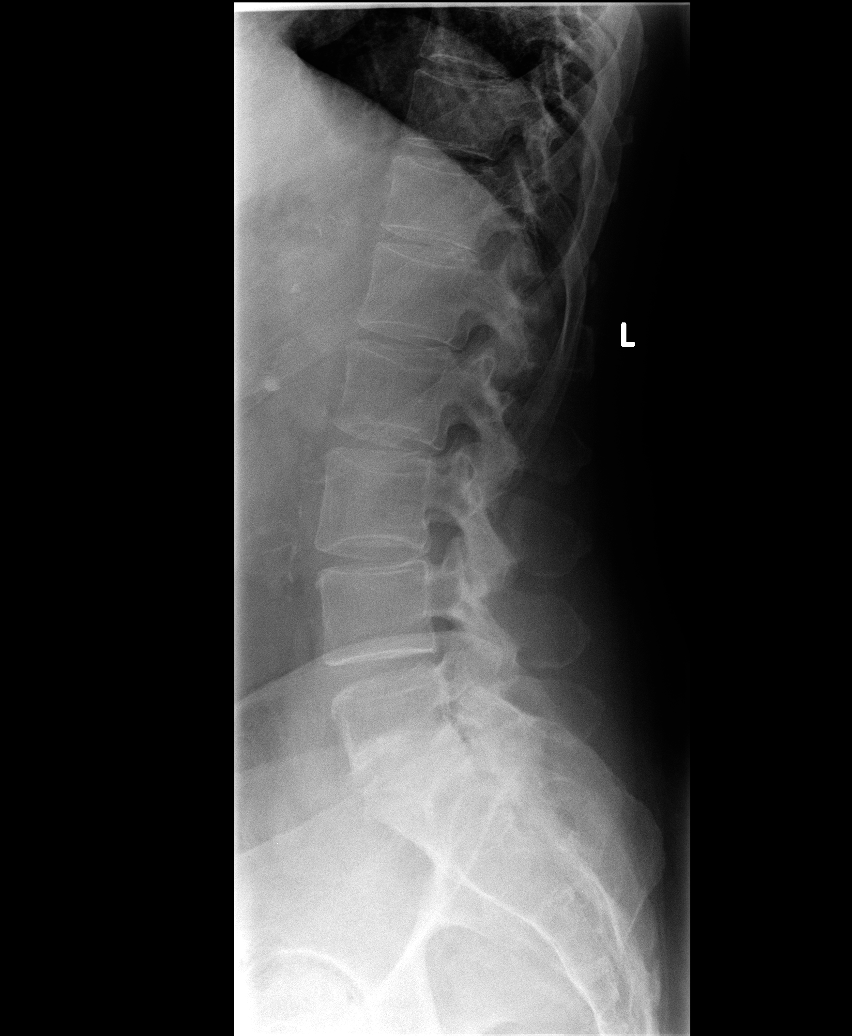

[view not recorded (5 of 5)]
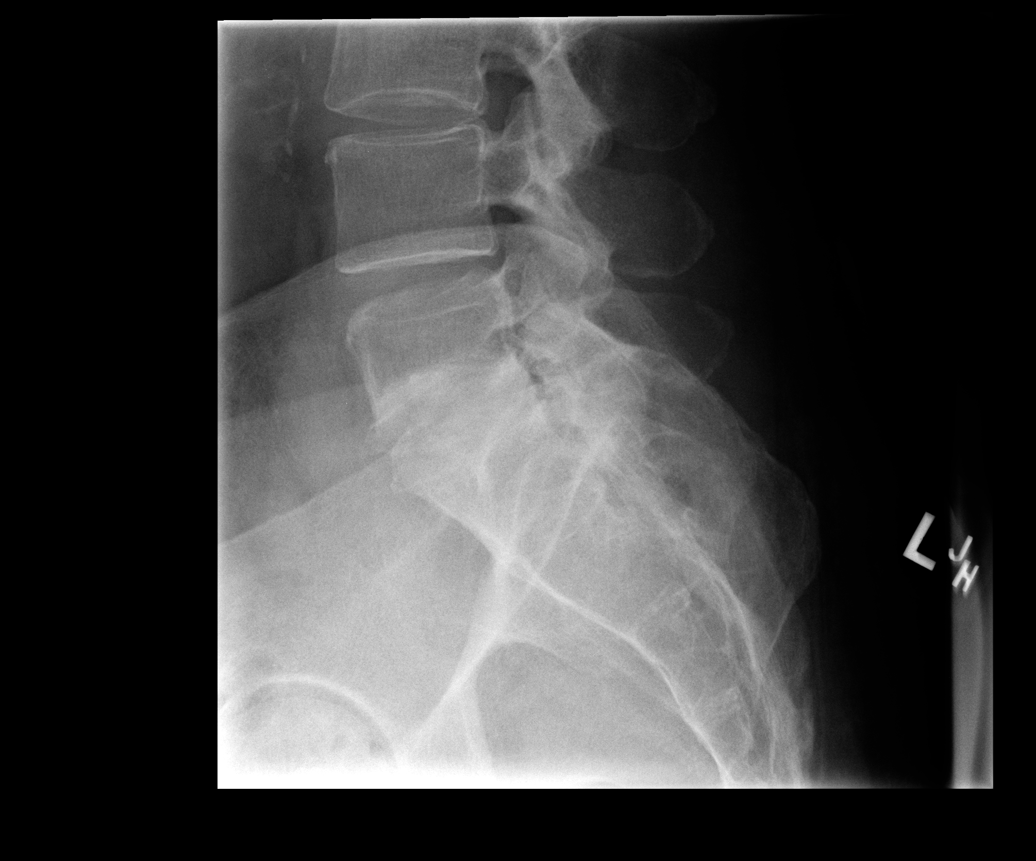

[5 of 5 positions shown; findings below may reference images not displayed]

FINDINGS: No fracture or spondylolisthesis is noted. Severe degenerative disc
disease is present at L5-S1. Posterior facet joints appear normal.
Other disc spaces appear to be well maintained.
IMPRESSION: Severe degenerative disc disease seen at L5-S1. No acute abnormality
seen in the lumbar spine.

## 2015-05-04 ENCOUNTER — Encounter (HOSPITAL_COMMUNITY): Payer: Self-pay | Admitting: Psychiatry

## 2015-05-04 ENCOUNTER — Ambulatory Visit (INDEPENDENT_AMBULATORY_CARE_PROVIDER_SITE_OTHER): Payer: Medicare Other | Admitting: Psychiatry

## 2015-05-04 VITALS — BP 124/78 | HR 76 | Ht 66.0 in | Wt 201.0 lb

## 2015-05-04 DIAGNOSIS — F330 Major depressive disorder, recurrent, mild: Secondary | ICD-10-CM | POA: Diagnosis not present

## 2015-05-04 DIAGNOSIS — F411 Generalized anxiety disorder: Secondary | ICD-10-CM | POA: Diagnosis not present

## 2015-05-04 MED ORDER — ALPRAZOLAM 1 MG PO TABS: 1.0000 mg | ORAL_TABLET | Freq: Every day | ORAL | Status: DC | PRN

## 2015-05-04 MED ORDER — BUPROPION HCL ER (XL) 300 MG PO TB24: 300.0000 mg | ORAL_TABLET | Freq: Every day | ORAL | Status: DC

## 2015-05-04 MED ORDER — DULOXETINE HCL 30 MG PO CPEP: ORAL_CAPSULE | ORAL | Status: DC

## 2015-05-04 NOTE — Progress Notes (Signed)
[facility] Progress Note  Garald Peterson [id number] 69 y.o.  05/04/2015 2:45 PM  Chief Complaint:  Medication management and followup.      History of Present Illness:  Randy Peterson came for his followup appointment.  He is taking his medication as prescribed.  He denies any side effects.  He has no tremors, shakes or any side effects.  He takes Xanax half to one tablet as needed.  He denies any severe are major panic attack.  He denies any irritability, anger, severe mood swings.  His appetite is okay.  His vitals are stable.  Patient lives with his wife. His daughter lives close by.  Suicidal Ideation: No Plan Formed: No Patient has means to carry out plan: No  Homicidal Ideation: No Plan Formed: No Patient has means to carry out plan: No  Review of Systems: Psychiatric: Agitation: No Hallucination: No Depressed Mood: No Insomnia: No Hypersomnia: No Altered Concentration: No Feels Worthless: No Grandiose Ideas: No Belief In Special Powers: No New/Increased Substance Abuse: No Compulsions: No  Neurologic: Headache: No Seizure: No Paresthesias: No  Past Medical History:  Patient see Dr. Deon Pilling.  He has history of Acid reflux, chronic back pain and enlarged prostate.   Outpatient Encounter Prescriptions as of 05/04/2015  Medication Sig  . ALPRAZolam (XANAX) 1 MG tablet Take 1 tablet (1 mg total) by mouth daily as needed for anxiety.  Marland Kitchen buPROPion (WELLBUTRIN XL) 300 MG 24 hr tablet Take 1 tablet (300 mg total) by mouth daily.  . cholecalciferol (VITAMIN D) 1000 UNITS tablet Take 1,000 Units by mouth daily.  . DULoxetine (CYMBALTA) 30 MG capsule TAKE 1 CAPSULE (30 MG TOTAL) BY MOUTH DAILY.  . finasteride (PROSCAR) 5 MG tablet Take 5 mg by mouth daily.  . pantoprazole (PROTONIX) 20 MG tablet Take 20 mg by mouth daily.  . ranitidine (ZANTAC) 150 MG tablet Take 150 mg by mouth 2 (two) times daily.  . tamsulosin (FLOMAX) 0.4 MG CAPS capsule Take 0.4 mg  by mouth.  . [DISCONTINUED] ALPRAZolam (XANAX) 1 MG tablet Take 1 tablet (1 mg total) by mouth daily as needed for anxiety.  . [DISCONTINUED] buPROPion (WELLBUTRIN XL) 300 MG 24 hr tablet TAKE 1 TABLET EVERY DAY  . [DISCONTINUED] DULoxetine (CYMBALTA) 30 MG capsule TAKE 1 CAPSULE (30 MG TOTAL) BY MOUTH DAILY.   No facility-administered encounter medications on file as of 05/04/2015.    Past Psychiatric History/Hospitalization(s): Patient has history  Inpatient in 2008 due to suicidal attempt and require inpatient services. He cut his wrist.  At that time he had a stress because of his job and finances.  In the past he has given Paxil and Risperdal.  He was also given Vyvanse to help his attention and concentration however it was discontinued because of increased anxiety.  Patient never had psychological testing. Patient does not recall any mania, psychosis or any hallucination.   Anxiety: Yes Bipolar Disorder: No Depression: Yes Mania: No Psychosis: No Schizophrenia: No Personality Disorder: No Hospitalization for psychiatric illness: Yes History of Electroconvulsive Shock Therapy: No Prior Suicide Attempts: Yes  Physical Exam: Constitutional:  BP 124/78 mmHg  Pulse 76  Ht 5\' 6"  (1.676 m)  Wt 201 lb (91.173 kg)  BMI 32.46 kg/m2  General Appearance: alert, oriented, no acute distress, well nourished, obese and well groomed and casually dressed  Musculoskeletal: Strength & Muscle Tone: within normal limits Gait & Station: normal Patient leans: N/A  Mental Status Examination; Patient is casually dressed and fairly  groomed.  He is calm and cooperative.  His speech is clear and coherent.  His thought process is logical and goal directed.  He denies any auditory or visual hallucination.  He denies any active or passive suicidal thoughts or homicidal thought.  There were no delusions or any paranoia present.  There were no flight of ideas or any loose association.  His fund of knowledge  is good.  He has no tremors or shakes.  His attention and concentration is good.  His immediate and recent memory is intact.  He is alert and oriented x3.  His psychomotor activity is normal.  His insight judgment and impulse control is okay.  Established Problem, Stable/Improving (1), Review of Last Therapy Session (1) and Review of Medication Regimen & Side Effects (2)  Assessment: Axis I: Maj. depressive disorder, recurrent, mild;   Axis II: Deferred  Axis III: Acid reflux, enlarged prostate, chronic back pain   Plan:  Patient is doing better on his current psychotropic medication.  I will continue Wellbutrin XL 300 mg daily and Cymbalta 30 mg daily and Xanax 1 mg half to one tablet as needed for insomnia and anxiety.  Patient does not ask for early refills of Xanax.  It has no tremors, shakes or any serotonin syndrome.  Discussed medication side effects and benefits. Recommended to call us back if he has any question or any concern.  Followup in 3 months.    Bridgit Eynon T., MD 05/04/2015

## 2015-05-16 ENCOUNTER — Inpatient Hospital Stay (HOSPITAL_COMMUNITY)
Admission: EM | Admit: 2015-05-16 | Discharge: 2015-05-18 | DRG: 247 | Disposition: A | Discharge status: Home / Self Care | Payer: Medicare Other | Attending: Cardiology | Admitting: Cardiology

## 2015-05-16 ENCOUNTER — Emergency Department (HOSPITAL_COMMUNITY): Payer: Medicare Other

## 2015-05-16 ENCOUNTER — Encounter (HOSPITAL_COMMUNITY): Payer: Self-pay | Admitting: Emergency Medicine

## 2015-05-16 DIAGNOSIS — E785 Hyperlipidemia, unspecified: Secondary | ICD-10-CM | POA: Diagnosis not present

## 2015-05-16 DIAGNOSIS — I2111 ST elevation (STEMI) myocardial infarction involving right coronary artery: Secondary | ICD-10-CM | POA: Diagnosis not present

## 2015-05-16 DIAGNOSIS — F909 Attention-deficit hyperactivity disorder, unspecified type: Secondary | ICD-10-CM | POA: Diagnosis present

## 2015-05-16 DIAGNOSIS — I214 Non-ST elevation (NSTEMI) myocardial infarction: Secondary | ICD-10-CM | POA: Diagnosis not present

## 2015-05-16 DIAGNOSIS — F329 Major depressive disorder, single episode, unspecified: Secondary | ICD-10-CM | POA: Diagnosis present

## 2015-05-16 DIAGNOSIS — I25119 Atherosclerotic heart disease of native coronary artery with unspecified angina pectoris: Secondary | ICD-10-CM | POA: Diagnosis present

## 2015-05-16 DIAGNOSIS — I200 Unstable angina: Secondary | ICD-10-CM | POA: Diagnosis present

## 2015-05-16 DIAGNOSIS — Z79899 Other long term (current) drug therapy: Secondary | ICD-10-CM

## 2015-05-16 DIAGNOSIS — Z9861 Coronary angioplasty status: Secondary | ICD-10-CM

## 2015-05-16 DIAGNOSIS — I252 Old myocardial infarction: Secondary | ICD-10-CM | POA: Diagnosis present

## 2015-05-16 DIAGNOSIS — Z955 Presence of coronary angioplasty implant and graft: Secondary | ICD-10-CM

## 2015-05-16 DIAGNOSIS — K219 Gastro-esophageal reflux disease without esophagitis: Secondary | ICD-10-CM | POA: Diagnosis present

## 2015-05-16 DIAGNOSIS — F419 Anxiety disorder, unspecified: Secondary | ICD-10-CM | POA: Diagnosis present

## 2015-05-16 DIAGNOSIS — N400 Enlarged prostate without lower urinary tract symptoms: Secondary | ICD-10-CM | POA: Diagnosis present

## 2015-05-16 DIAGNOSIS — I2089 Other forms of angina pectoris: Secondary | ICD-10-CM

## 2015-05-16 DIAGNOSIS — R079 Chest pain, unspecified: Secondary | ICD-10-CM | POA: Diagnosis present

## 2015-05-16 DIAGNOSIS — I2510 Atherosclerotic heart disease of native coronary artery without angina pectoris: Secondary | ICD-10-CM | POA: Diagnosis not present

## 2015-05-16 DIAGNOSIS — I208 Other forms of angina pectoris: Secondary | ICD-10-CM

## 2015-05-16 HISTORY — DX: Hyperlipidemia, unspecified: E78.5

## 2015-05-16 HISTORY — DX: Presence of coronary angioplasty implant and graft: Z95.5

## 2015-05-16 HISTORY — DX: Other forms of angina pectoris: I20.8

## 2015-05-16 HISTORY — DX: ST elevation (STEMI) myocardial infarction involving other coronary artery of inferior wall: I21.19

## 2015-05-16 HISTORY — DX: Atherosclerotic heart disease of native coronary artery without angina pectoris: I25.10

## 2015-05-16 HISTORY — DX: Other forms of angina pectoris: I20.89

## 2015-05-16 LAB — BASIC METABOLIC PANEL
Anion gap: 7 (ref 5–15)
BUN: 21 mg/dL — ABNORMAL HIGH (ref 6–20)
CALCIUM: 9.7 mg/dL (ref 8.9–10.3)
CO2: 28 mmol/L (ref 22–32)
CREATININE: 1.27 mg/dL — AB (ref 0.61–1.24)
Chloride: 102 mmol/L (ref 101–111)
GFR, EST NON AFRICAN AMERICAN: 57 mL/min — AB (ref >60)
Glucose, Bld: 121 mg/dL — ABNORMAL HIGH (ref 65–99)
Potassium: 4.3 mmol/L (ref 3.5–5.1)
SODIUM: 137 mmol/L (ref 135–145)

## 2015-05-16 LAB — CBC WITH DIFFERENTIAL/PLATELET
Basophils Absolute: 0 10*3/uL (ref 0.0–0.1)
Basophils Relative: 0 % (ref 0–1)
EOS PCT: 2 % (ref 0–5)
Eosinophils Absolute: 0.2 10*3/uL (ref 0.0–0.7)
HEMATOCRIT: 42.8 % (ref 39.0–52.0)
Hemoglobin: 14.4 g/dL (ref 13.0–17.0)
LYMPHS ABS: 1 10*3/uL (ref 0.7–4.0)
LYMPHS PCT: 10 % — AB (ref 12–46)
MCH: 30.4 pg (ref 26.0–34.0)
MCHC: 33.6 g/dL (ref 30.0–36.0)
MCV: 90.3 fL (ref 78.0–100.0)
MONO ABS: 0.6 10*3/uL (ref 0.1–1.0)
MONOS PCT: 6 % (ref 3–12)
NEUTROS ABS: 8.2 10*3/uL — AB (ref 1.7–7.7)
Neutrophils Relative %: 82 % — ABNORMAL HIGH (ref 43–77)
PLATELETS: 251 10*3/uL (ref 150–400)
RBC: 4.74 MIL/uL (ref 4.22–5.81)
RDW: 13.1 % (ref 11.5–15.5)
WBC: 10 10*3/uL (ref 4.0–10.5)

## 2015-05-16 LAB — I-STAT TROPONIN, ED: Troponin i, poc: 2.32 ng/mL (ref 0.00–0.08)

## 2015-05-16 LAB — TROPONIN I: Troponin I: 1.93 ng/mL (ref <0.031)

## 2015-05-16 MED ORDER — CYCLOSPORINE 0.05 % OP EMUL
1.0000 [drp] | Freq: Every day | OPHTHALMIC | Status: DC | PRN
  Filled 2015-05-16: qty 1

## 2015-05-16 MED ORDER — SODIUM CHLORIDE 0.9 % IJ SOLN: 3.0000 mL | INTRAMUSCULAR | Status: DC | PRN

## 2015-05-16 MED ORDER — METOPROLOL TARTRATE 1 MG/ML IV SOLN: 5.0000 mg | Freq: Once | INTRAVENOUS | Status: DC

## 2015-05-16 MED ORDER — PANTOPRAZOLE SODIUM 20 MG PO TBEC
20.0000 mg | DELAYED_RELEASE_TABLET | Freq: Every day | ORAL | Status: DC
  Administered 2015-05-17 – 2015-05-18 (×2): 20 mg via ORAL
  Filled 2015-05-16 (×2): qty 1

## 2015-05-16 MED ORDER — DULOXETINE HCL 30 MG PO CPEP
30.0000 mg | ORAL_CAPSULE | Freq: Every day | ORAL | Status: DC
  Administered 2015-05-17 – 2015-05-18 (×2): 30 mg via ORAL
  Filled 2015-05-16 (×2): qty 1

## 2015-05-16 MED ORDER — BUPROPION HCL ER (XL) 150 MG PO TB24
300.0000 mg | ORAL_TABLET | Freq: Every day | ORAL | Status: DC
  Administered 2015-05-17 – 2015-05-18 (×2): 300 mg via ORAL
  Filled 2015-05-16 (×2): qty 2

## 2015-05-16 MED ORDER — ASPIRIN 81 MG PO CHEW
81.0000 mg | CHEWABLE_TABLET | ORAL | Status: AC
  Administered 2015-05-17: 81 mg via ORAL
  Filled 2015-05-16: qty 1

## 2015-05-16 MED ORDER — ACETAMINOPHEN 325 MG PO TABS: 650.0000 mg | ORAL_TABLET | ORAL | Status: DC | PRN

## 2015-05-16 MED ORDER — VITAMIN D 1000 UNITS PO TABS
1000.0000 [IU] | ORAL_TABLET | Freq: Every day | ORAL | Status: DC
  Administered 2015-05-17 – 2015-05-18 (×2): 1000 [IU] via ORAL
  Filled 2015-05-16 (×2): qty 1

## 2015-05-16 MED ORDER — SODIUM CHLORIDE 0.9 % IJ SOLN: 3.0000 mL | Freq: Two times a day (BID) | INTRAMUSCULAR | Status: DC

## 2015-05-16 MED ORDER — SODIUM CHLORIDE 0.9 % WEIGHT BASED INFUSION
1.0000 mL/kg/h | INTRAVENOUS | Status: DC
  Administered 2015-05-17: 1 mL/kg/h via INTRAVENOUS

## 2015-05-16 MED ORDER — FAMOTIDINE 20 MG PO TABS
20.0000 mg | ORAL_TABLET | Freq: Every day | ORAL | Status: DC
  Administered 2015-05-17 – 2015-05-18 (×2): 20 mg via ORAL
  Filled 2015-05-16 (×2): qty 1

## 2015-05-16 MED ORDER — NITROGLYCERIN IN D5W 200-5 MCG/ML-% IV SOLN
10.0000 ug/min | INTRAVENOUS | Status: DC
  Administered 2015-05-16: 10 ug/min via INTRAVENOUS
  Filled 2015-05-16: qty 250

## 2015-05-16 MED ORDER — ONDANSETRON HCL 4 MG/2ML IJ SOLN: 4.0000 mg | Freq: Four times a day (QID) | INTRAMUSCULAR | Status: DC | PRN

## 2015-05-16 MED ORDER — ATORVASTATIN CALCIUM 80 MG PO TABS
80.0000 mg | ORAL_TABLET | Freq: Every day | ORAL | Status: DC
  Administered 2015-05-17: 80 mg via ORAL
  Filled 2015-05-16: qty 1

## 2015-05-16 MED ORDER — TAMSULOSIN HCL 0.4 MG PO CAPS
0.4000 mg | ORAL_CAPSULE | Freq: Every day | ORAL | Status: DC
Start: 2015-05-17 — End: 2015-05-18
  Administered 2015-05-17 – 2015-05-18 (×2): 0.4 mg via ORAL
  Filled 2015-05-16 (×2): qty 1

## 2015-05-16 MED ORDER — ALPRAZOLAM 0.5 MG PO TABS
1.0000 mg | ORAL_TABLET | Freq: Every day | ORAL | Status: DC | PRN
Start: 2015-05-16 — End: 2015-05-18

## 2015-05-16 MED ORDER — NITROGLYCERIN 0.4 MG SL SUBL
0.4000 mg | SUBLINGUAL_TABLET | SUBLINGUAL | Status: DC | PRN
  Filled 2015-05-16: qty 1

## 2015-05-16 MED ORDER — METOPROLOL TARTRATE 25 MG PO TABS
25.0000 mg | ORAL_TABLET | Freq: Two times a day (BID) | ORAL | Status: DC
  Administered 2015-05-17 – 2015-05-18 (×4): 25 mg via ORAL
  Filled 2015-05-16 (×4): qty 1

## 2015-05-16 MED ORDER — FINASTERIDE 5 MG PO TABS
5.0000 mg | ORAL_TABLET | Freq: Every day | ORAL | Status: DC
  Administered 2015-05-17 – 2015-05-18 (×2): 5 mg via ORAL
  Filled 2015-05-16 (×2): qty 1

## 2015-05-16 MED ORDER — SODIUM CHLORIDE 0.9 % IJ SOLN
3.0000 mL | Freq: Two times a day (BID) | INTRAMUSCULAR | Status: DC
  Administered 2015-05-17 – 2015-05-18 (×4): 3 mL via INTRAVENOUS

## 2015-05-16 MED ORDER — SODIUM CHLORIDE 0.9 % IV SOLN: 250.0000 mL | INTRAVENOUS | Status: DC | PRN

## 2015-05-16 MED ORDER — ASPIRIN EC 81 MG PO TBEC
81.0000 mg | DELAYED_RELEASE_TABLET | Freq: Every day | ORAL | Status: DC
Start: 2015-05-17 — End: 2015-05-18
  Administered 2015-05-18: 81 mg via ORAL
  Filled 2015-05-16: qty 1

## 2015-05-16 MED ORDER — NITROGLYCERIN IN D5W 200-5 MCG/ML-% IV SOLN: 3.0000 ug/min | INTRAVENOUS | Status: DC

## 2015-05-16 MED ORDER — HEPARIN (PORCINE) IN NACL 100-0.45 UNIT/ML-% IJ SOLN
1200.0000 [IU]/h | INTRAMUSCULAR | Status: DC
Start: 2015-05-16 — End: 2015-05-17
  Administered 2015-05-16: 1200 [IU]/h via INTRAVENOUS
  Filled 2015-05-16: qty 250

## 2015-05-16 MED ORDER — HEPARIN BOLUS VIA INFUSION
4000.0000 [IU] | Freq: Once | INTRAVENOUS | Status: AC
  Administered 2015-05-16: 4000 [IU] via INTRAVENOUS
  Filled 2015-05-16: qty 4000

## 2015-05-16 MED ORDER — ASPIRIN 81 MG PO CHEW
162.0000 mg | CHEWABLE_TABLET | Freq: Once | ORAL | Status: AC
  Administered 2015-05-16: 162 mg via ORAL
  Filled 2015-05-16: qty 2

## 2015-05-16 NOTE — ED Provider Notes (Signed)
I received the critical troponin result for the patient. I have asked our staff to room patient immediately. His EKG has ST elevation in the inferior leads, but there is no reciprocal depression, no old tracing to compare. I assumed the patient was coming to POD A, and have not called code STEMI before seeing him, based on the triage note of symptoms x 3 days, he will be moved to POD E, where Dr. Darl Householder has been alerted by me.   EKG Interpretation  Date/Time:  Tuesday May 16 2015 18:28:19 EDT Ventricular Rate:  78 PR Interval:  134 QRS Duration: 90 QT Interval:  358 QTC Calculation: 408 R Axis:   76 Text Interpretation:  Normal sinus rhythm Cannot rule out Anterior infarct , age undetermined T wave abnormality, consider inferior ischemia Abnormal ECG ST elevation in leads 3 and avF No old tracing to compare Confirmed by Kathrynn Humble, MD, [facility] [phone]) on 05/16/2015 7:37:14 PM        Varney Biles, MD 05/16/15 1944

## 2015-05-16 NOTE — ED Notes (Signed)
Lab notified RN of critical troponin level of 1.93 at this time.

## 2015-05-16 NOTE — H&P (Signed)
CARDIOLOGY ADMISSION NOTE  Patient ID: Randy Peterson MRN: [id number] DOB/AGE: 1945/11/03 69 y.o.  Admit date: 05/16/2015 Primary Physician   Marylene Land, MD Primary Cardiologist   None Chief Complaint    Chest pain  HPI:  The patient has no prior cardiac history.  He has noticed exertional chest pain for the past 5 days when he has been working outside. This would go away with Tums or rest. He developed chest pain at about 2 pm again while working in the yard.  At its peak this was 8/10 substernal with left arm radiation.  He did not have associated symptoms.  He presented to the ED greater than 4 hours later and he did have 1/2 mm ST elevation in the inferior leads on presentation.  Follow up EKG approximately two hours after the original EKG demonstrated some improvement in this.  On my arrival to the ED the patient is now describing 1/10 chest pain.  Troponin is elevated at 2.32.  He otherwise has been feeling well.  The patient denies any new symptoms such as shortness of breath, PND or orthopnea. There have been no reported palpitations, presyncope or syncope.       Past Medical History  Diagnosis Date  . ADHD (attention deficit hyperactivity disorder)   . Depression   . Anxiety   . Prostate enlargement   . Acid reflux     History reviewed. No pertinent past surgical history.  No Known Allergies No current facility-administered medications on file prior to encounter.   Current Outpatient Prescriptions on File Prior to Encounter  Medication Sig Dispense Refill  . ALPRAZolam (XANAX) 1 MG tablet Take 1 tablet (1 mg total) by mouth daily as needed for anxiety. 90 tablet 0  . buPROPion (WELLBUTRIN XL) 300 MG 24 hr tablet Take 1 tablet (300 mg total) by mouth daily. 90 tablet 0  . cholecalciferol (VITAMIN D) 1000 UNITS tablet Take 1,000 Units by mouth daily.    . DULoxetine (CYMBALTA) 30 MG capsule TAKE 1 CAPSULE (30 MG TOTAL) BY MOUTH DAILY. 90 capsule 0  . finasteride  (PROSCAR) 5 MG tablet Take 5 mg by mouth daily.    . pantoprazole (PROTONIX) 20 MG tablet Take 20 mg by mouth daily.    . ranitidine (ZANTAC) 150 MG tablet Take 150 mg by mouth 2 (two) times daily.    . tamsulosin (FLOMAX) 0.4 MG CAPS capsule Take 0.4 mg by mouth.     Social History   Social History  . Marital Status: Married    Spouse Name: N/A  . Number of Children: N/A  . Years of Education: N/A   Occupational History  . Not on file.   Social History Main Topics  . Smoking status: Former Smoker -- 0.25 packs/day for 40 years    Types: Cigarettes    Quit date: 10/06/2004  . Smokeless tobacco: Not on file  . Alcohol Use: 1.2 oz/week    2 Cans of beer per week  . Drug Use: No  . Sexual Activity: Yes    Birth Control/ Protection: None   Other Topics Concern  . Not on file   Social History Narrative    Family History  Problem Relation Age of Onset  . Dementia Mother   . Suicidality Brother   . Depression Brother      ROS:    Snores.  Otherwise as stated in the HPI and negative for all other systems.  Physical Exam: Blood pressure 156/98, pulse 85, temperature  99.2 F (37.3 C), temperature source Oral, resp. rate 18, SpO2 99 %.  GENERAL:  Well appearing HEENT:  Pupils equal round and reactive, fundi not visualized, oral mucosa unremarkable NECK:  No jugular venous distention, waveform within normal limits, carotid upstroke brisk and symmetric, no bruits, no thyromegaly LYMPHATICS:  No cervical, inguinal adenopathy LUNGS:  Clear to auscultation bilaterally BACK:  No CVA tenderness CHEST:  Unremarkable HEART:  PMI not displaced or sustained,S1 and S2 within normal limits, no S3, no S4, no clicks, no rubs, no murmurs ABD:  Flat, positive bowel sounds normal in frequency in pitch, no bruits, no rebound, no guarding, no midline pulsatile mass, no hepatomegaly, no splenomegaly EXT:  2 plus pulses throughout, no edema, no cyanosis no clubbing SKIN:  No rashes no  nodules NEURO:  Cranial nerves II through XII grossly intact, motor grossly intact throughout PSYCH:  Cognitively intact, oriented to person place and time   Labs: Lab Results  Component Value Date   BUN 21* 05/16/2015   Lab Results  Component Value Date   CREATININE 1.27* 05/16/2015   Lab Results  Component Value Date   NA 137 05/16/2015   K 4.3 05/16/2015   CL 102 05/16/2015   CO2 28 05/16/2015   Lab Results  Component Value Date   TROPONINI 1.93* 05/16/2015   Lab Results  Component Value Date   WBC 10.0 05/16/2015   HGB 14.4 05/16/2015   HCT 42.8 05/16/2015   MCV 90.3 05/16/2015   PLT 251 05/16/2015     Radiology:  CXR:  The lungs are well-aerated and clear. There is no evidence of focal opacification, pleural effusion or pneumothorax.The heart is normal in size; the mediastinal contour is within  normal limits. No acute osseous abnormalities are seen.   EKG:    NSR, RAD, rate 76, inferior Q wave in AvF and T wave inversion same lead with minimal ST elevation non diagnostic in the inferior leads.  05/16/2015   ASSESSMENT AND PLAN:    INFERIOR MI:   His pain onset is now greater than 7 hours ago.  His symptoms have resolved and the EKG acute ST elevation, subtle originally, has improved.  Given this there is no indication for emergent cath now.  I will treat him with NTG, heparin ASA and beta blocker.  I had a long discussion with the patient and his wife about CAD.  We discussed cardiac cath.  The patient understands that risks included but are not limited to stroke (1 in 1000), death (1 in 29), kidney failure [usually temporary] (1 in 500), bleeding (1 in 200), allergic reaction [possibly serious] (1 in 200).  The patient understands and agrees to proceed electively.  RISK REDUCTION:  Start statin and check lipid profile.  DEPRESSION:  Well treated.  Continue previous meds.    SignedMinus Breeding 05/16/2015, 9:31 PM

## 2015-05-16 NOTE — ED Notes (Signed)
Pt sts left sided CP with radiation down left arm x 3 days intermittently

## 2015-05-16 NOTE — ED Notes (Signed)
Unable to give report to receiving RN at this time. RN on floor is busy with patient care.

## 2015-05-16 NOTE — ED Provider Notes (Addendum)
CSN: [id number]     Arrival date & time 05/16/15  1822 History   First MD Initiated Contact with Patient 05/16/15 1937     Chief Complaint  Patient presents with  . Chest Pain     (Consider location/radiation/quality/duration/timing/severity/associated sxs/prior Treatment) The history is provided by the patient.  Randy Peterson is a 69 y.o. male hx of ADHD, former smoker, here with chest pain. Intermittent substernal chest pain radiating to the left arm for the last 3 days. Today the pain is constant. He thought it was gas. He took some tums and 2 baby aspirin with no relief. He called his doctor and has follow up with cardiology tomorrow but was told to come in because he has persistent chest pain. No hx of CAD. He is a former smoker. His father and brother both had heart attacks (father age 48s, brother 30s). No previous stress test or cardiology workup in the past.    Past Medical History  Diagnosis Date  . ADHD (attention deficit hyperactivity disorder)   . Depression   . Anxiety   . Prostate enlargement   . Acid reflux    History reviewed. No pertinent past surgical history. Family History  Problem Relation Age of Onset  . Dementia Mother   . Suicidality Brother   . Depression Brother    Social History  Substance Use Topics  . Smoking status: Former Smoker -- 0.25 packs/day for 40 years    Types: Cigarettes    Quit date: 10/06/2004  . Smokeless tobacco: None  . Alcohol Use: 1.2 oz/week    2 Cans of beer per week    Review of Systems  Cardiovascular: Positive for chest pain.  All other systems reviewed and are negative.     Allergies  Review of patient's allergies indicates no known allergies.  Home Medications   Prior to Admission medications   Medication Sig Start Date End Date Taking? Authorizing Provider  ALPRAZolam Duanne Moron) 1 MG tablet Take 1 tablet (1 mg total) by mouth daily as needed for anxiety. 05/04/15   Kathlee Nations, MD  buPROPion (WELLBUTRIN XL)  300 MG 24 hr tablet Take 1 tablet (300 mg total) by mouth daily. 05/04/15   Kathlee Nations, MD  cholecalciferol (VITAMIN D) 1000 UNITS tablet Take 1,000 Units by mouth daily.    Historical Provider, MD  DULoxetine (CYMBALTA) 30 MG capsule TAKE 1 CAPSULE (30 MG TOTAL) BY MOUTH DAILY. 05/04/15   Kathlee Nations, MD  finasteride (PROSCAR) 5 MG tablet Take 5 mg by mouth daily.    Historical Provider, MD  pantoprazole (PROTONIX) 20 MG tablet Take 20 mg by mouth daily.    Historical Provider, MD  ranitidine (ZANTAC) 150 MG tablet Take 150 mg by mouth 2 (two) times daily.    Historical Provider, MD  tamsulosin (FLOMAX) 0.4 MG CAPS capsule Take 0.4 mg by mouth.    Historical Provider, MD   BP 156/98 mmHg  Pulse 85  Temp(Src) 99.2 F (37.3 C) (Oral)  Resp 18  SpO2 99% Physical Exam  Constitutional: He is oriented to person, place, and time.  Uncomfortable   HENT:  Head: Normocephalic.  Mouth/Throat: Oropharynx is clear and moist.  Eyes: Conjunctivae are normal. Pupils are equal, round, and reactive to light.  Neck: Normal range of motion. Neck supple.  Cardiovascular: Normal rate, regular rhythm and normal heart sounds.   Pulmonary/Chest: Effort normal and breath sounds normal. No respiratory distress. He has no wheezes. He has no rales.  Abdominal: Soft. Bowel sounds are normal. He exhibits no distension. There is no tenderness. There is no rebound.  Musculoskeletal: Normal range of motion. He exhibits no edema or tenderness.  Neurological: He is alert and oriented to person, place, and time. No cranial nerve deficit. Coordination normal.  Skin: Skin is warm and dry.  Psychiatric: He has a normal mood and affect. His behavior is normal. Judgment and thought content normal.  Nursing note and vitals reviewed.   ED Course  Procedures (including critical care time)  CRITICAL CARE Performed by: Darl Householder, Posey Petrik   Total critical care time: 30 min   Critical care time was exclusive of separately  billable procedures and treating other patients.  Critical care was necessary to treat or prevent imminent or life-threatening deterioration.  Critical care was time spent personally by me on the following activities: development of treatment plan with patient and/or surrogate as well as nursing, discussions with consultants, evaluation of patient's response to treatment, examination of patient, obtaining history from patient or surrogate, ordering and performing treatments and interventions, ordering and review of laboratory studies, ordering and review of radiographic studies, pulse oximetry and re-evaluation of patient's condition.   Labs Review Labs Reviewed  BASIC METABOLIC PANEL - Abnormal; Notable for the following:    Glucose, Bld 121 (*)    BUN 21 (*)    Creatinine, Ser 1.27 (*)    GFR calc non Af Amer 57 (*)    All other components within normal limits  CBC WITH DIFFERENTIAL/PLATELET - Abnormal; Notable for the following:    Neutrophils Relative % 82 (*)    Neutro Abs 8.2 (*)    Lymphocytes Relative 10 (*)    All other components within normal limits  I-STAT TROPOININ, ED - Abnormal; Notable for the following:    Troponin i, poc 2.32 (*)    All other components within normal limits  TROPONIN I  HEPARIN LEVEL (UNFRACTIONATED)  CBC  CBC    Imaging Review Dg Chest 2 View  05/16/2015   CLINICAL DATA:  Acute onset of left-sided chest pain, radiating to left arm and hand. Initial encounter.  EXAM: CHEST  2 VIEW  COMPARISON:  Chest radiograph performed 09/13/2009, and CT of the chest performed 08/29/2006  FINDINGS: The lungs are well-aerated and clear. There is no evidence of focal opacification, pleural effusion or pneumothorax.  The heart is normal in size; the mediastinal contour is within normal limits. No acute osseous abnormalities are seen.  IMPRESSION: No acute cardiopulmonary process seen.   Electronically Signed   By: Garald Balding M.D.   On: 05/16/2015 19:26   I have  personally reviewed and evaluated these images and lab results as part of my medical decision-making.   EKG Interpretation   Date/Time:  Tuesday May 16 2015 19:49:28 EDT Ventricular Rate:  76 PR Interval:  139 QRS Duration: 98 QT Interval:  388 QTC Calculation: 436 R Axis:   104 Text Interpretation:  Sinus rhythm Right axis deviation No significant  change since last tracing Confirmed by Noal Abshier  MD, Myracle Febres ([id number]) on 05/16/2015  8:03:43 PM      MDM   Final diagnoses:  None   Randy Peterson is a 69 y.o. male here with chest pain intermittently for 3 days, now constant today. Concerned for NSTEMI vs unstable angina. Initial EKG ? STEMI leads III, aVF but repeat showed no STEMI. Discussed with Dr. Martinique, STEMI doc on call, who doesn't think that we need to activate code STEMI. Will  start heparin, nitro drip. Will get labs, trop. Will consult cardiology.   8:10 pm Called Dr. Martinique, STEMI doc, who reviewed EKG. Feels that doesn't need to activate code STEMI. First EKG showed 0.5 mm elevation in lead III only with no reciprocal changes, second one showed no STEMI. Doesn't feel that code STEMI is warranted. Recommend cardiology consult.   8:49 PM Trop 2.3. Pain improved with IV nitro. On heparin drip. Consulted Dr. Percival Spanish, who will see patient and admit for unstable angina.     Wandra Arthurs, MD 05/16/15 2050  Wandra Arthurs, MD 05/16/15 2050  Wandra Arthurs, MD 05/16/15 [phone]

## 2015-05-16 NOTE — Progress Notes (Signed)
ANTICOAGULATION CONSULT NOTE - Initial Consult  Pharmacy Consult for heparin Indication: chest pain/ACS  No Known Allergies  Patient Measurements:   Weight: 91.2 kg  Vital Signs: Temp: 99.2 F (37.3 C) (08/30 1828) Temp Source: Oral (08/30 1828) BP: 156/98 mmHg (08/30 1828) Pulse Rate: 85 (08/30 1828)  Labs:  Recent Labs  05/16/15 1845  HGB 14.4  HCT 42.8  PLT 251  CREATININE 1.27*    Estimated Creatinine Clearance: 59.7 mL/min (by C-G formula based on Cr of 1.27).   Medical History: Past Medical History  Diagnosis Date  . ADHD (attention deficit hyperactivity disorder)   . Depression   . Anxiety   . Prostate enlargement   . Acid reflux     Assessment: 69 yo M with CP to start heparin per pharmacy for ACS/STEMI.   Wt 91.2 kg, creat 1.27, CBC WNL. Troponin 2.32.   Goal of therapy:  Heparin level 0.3-0.7 units/ml Monitor platelets by anticoagulation protocol: Yes   Plan:  Give 4000 units bolus x 1 Start heparin infusion at 1200 units/hr Check anti-Xa level in 6 hours and daily while on heparin Continue to monitor H&H and platelets  Eudelia Bunch, Pharm.D. [phone] 05/16/2015 8:16 PM

## 2015-05-17 ENCOUNTER — Encounter (HOSPITAL_COMMUNITY): Payer: Self-pay | Admitting: Cardiovascular Disease

## 2015-05-17 ENCOUNTER — Other Ambulatory Visit: Payer: Self-pay

## 2015-05-17 ENCOUNTER — Ambulatory Visit: Payer: Self-pay | Admitting: Cardiology

## 2015-05-17 ENCOUNTER — Encounter (HOSPITAL_COMMUNITY)
Admission: EM | Disposition: A | Discharge status: Home / Self Care | Payer: Self-pay | Source: Home / Self Care | Attending: Cardiology

## 2015-05-17 ENCOUNTER — Inpatient Hospital Stay (HOSPITAL_COMMUNITY): Payer: Medicare Other

## 2015-05-17 DIAGNOSIS — I2510 Atherosclerotic heart disease of native coronary artery without angina pectoris: Secondary | ICD-10-CM

## 2015-05-17 DIAGNOSIS — E785 Hyperlipidemia, unspecified: Secondary | ICD-10-CM

## 2015-05-17 DIAGNOSIS — I2111 ST elevation (STEMI) myocardial infarction involving right coronary artery: Secondary | ICD-10-CM

## 2015-05-17 DIAGNOSIS — I2119 ST elevation (STEMI) myocardial infarction involving other coronary artery of inferior wall: Secondary | ICD-10-CM

## 2015-05-17 HISTORY — PX: CARDIAC CATHETERIZATION: SHX172

## 2015-05-17 HISTORY — DX: ST elevation (STEMI) myocardial infarction involving other coronary artery of inferior wall: I21.19

## 2015-05-17 LAB — TSH: TSH: 1.75 u[IU]/mL (ref 0.350–4.500)

## 2015-05-17 LAB — LIPID PANEL
CHOLESTEROL: 188 mg/dL (ref 0–200)
HDL: 49 mg/dL (ref >40)
LDL CALC: 127 mg/dL — AB (ref 0–99)
TRIGLYCERIDES: 59 mg/dL (ref <150)
Total CHOL/HDL Ratio: 3.8 RATIO
VLDL: 12 mg/dL (ref 0–40)

## 2015-05-17 LAB — CBC
HCT: 39.8 % (ref 39.0–52.0)
HEMATOCRIT: 40.4 % (ref 39.0–52.0)
HEMOGLOBIN: 13.8 g/dL (ref 13.0–17.0)
Hemoglobin: 13.4 g/dL (ref 13.0–17.0)
MCH: 30.4 pg (ref 26.0–34.0)
MCH: 30.9 pg (ref 26.0–34.0)
MCHC: 33.7 g/dL (ref 30.0–36.0)
MCHC: 34.2 g/dL (ref 30.0–36.0)
MCV: 90.2 fL (ref 78.0–100.0)
MCV: 90.6 fL (ref 78.0–100.0)
PLATELETS: 233 10*3/uL (ref 150–400)
Platelets: 240 10*3/uL (ref 150–400)
RBC: 4.41 MIL/uL (ref 4.22–5.81)
RBC: 4.46 MIL/uL (ref 4.22–5.81)
RDW: 13.3 % (ref 11.5–15.5)
RDW: 13.4 % (ref 11.5–15.5)
WBC: 7.9 10*3/uL (ref 4.0–10.5)
WBC: 8.4 10*3/uL (ref 4.0–10.5)

## 2015-05-17 LAB — BASIC METABOLIC PANEL
ANION GAP: 8 (ref 5–15)
BUN: 16 mg/dL (ref 6–20)
CO2: 27 mmol/L (ref 22–32)
Calcium: 9.3 mg/dL (ref 8.9–10.3)
Chloride: 102 mmol/L (ref 101–111)
Creatinine, Ser: 1.12 mg/dL (ref 0.61–1.24)
GLUCOSE: 115 mg/dL — AB (ref 65–99)
POTASSIUM: 3.8 mmol/L (ref 3.5–5.1)
Sodium: 137 mmol/L (ref 135–145)

## 2015-05-17 LAB — BRAIN NATRIURETIC PEPTIDE: B Natriuretic Peptide: 129.6 pg/mL — ABNORMAL HIGH (ref 0.0–100.0)

## 2015-05-17 LAB — POCT ACTIVATED CLOTTING TIME
ACTIVATED CLOTTING TIME: 313 s
Activated Clotting Time: 239 seconds
Activated Clotting Time: 300 seconds

## 2015-05-17 LAB — PROTIME-INR
INR: 1.05 (ref 0.00–1.49)
Prothrombin Time: 13.9 seconds (ref 11.6–15.2)

## 2015-05-17 LAB — PLATELET COUNT: PLATELETS: 222 10*3/uL (ref 150–400)

## 2015-05-17 LAB — TROPONIN I
TROPONIN I: 4.87 ng/mL — AB (ref <0.031)
Troponin I: 6.1 ng/mL (ref <0.031)
Troponin I: 15.57 ng/mL (ref <0.031)

## 2015-05-17 LAB — HEPARIN LEVEL (UNFRACTIONATED): HEPARIN UNFRACTIONATED: 0.6 [IU]/mL (ref 0.30–0.70)

## 2015-05-17 LAB — MRSA PCR SCREENING: MRSA by PCR: NEGATIVE

## 2015-05-17 SURGERY — LEFT HEART CATH AND CORONARY ANGIOGRAPHY
Anesthesia: LOCAL

## 2015-05-17 MED ORDER — TIROFIBAN HCL IV 5 MG/100ML
0.1500 ug/kg/min | INTRAVENOUS | Status: AC
  Administered 2015-05-17: 0.15 ug/kg/min via INTRAVENOUS

## 2015-05-17 MED ORDER — SODIUM CHLORIDE 0.9 % IV SOLN: 250.0000 mL | INTRAVENOUS | Status: DC | PRN

## 2015-05-17 MED ORDER — FENTANYL CITRATE (PF) 100 MCG/2ML IJ SOLN
INTRAMUSCULAR | Status: DC | PRN
Start: 2015-05-17 — End: 2015-05-17
  Administered 2015-05-17: 25 ug via INTRAVENOUS
  Administered 2015-05-17: 50 ug via INTRAVENOUS
  Administered 2015-05-17: 25 ug via INTRAVENOUS

## 2015-05-17 MED ORDER — MIDAZOLAM HCL 2 MG/2ML IJ SOLN
INTRAMUSCULAR | Status: DC | PRN
  Administered 2015-05-17: 1 mg via INTRAVENOUS
  Administered 2015-05-17: 2 mg via INTRAVENOUS
  Administered 2015-05-17: 1 mg via INTRAVENOUS

## 2015-05-17 MED ORDER — HEPARIN SODIUM (PORCINE) 1000 UNIT/ML IJ SOLN
INTRAMUSCULAR | Status: AC
  Filled 2015-05-17: qty 1

## 2015-05-17 MED ORDER — NITROGLYCERIN 1 MG/10 ML FOR IR/CATH LAB
INTRA_ARTERIAL | Status: DC | PRN
  Administered 2015-05-17: 10:00:00

## 2015-05-17 MED ORDER — NITROGLYCERIN 1 MG/10 ML FOR IR/CATH LAB
INTRA_ARTERIAL | Status: AC
  Filled 2015-05-17: qty 10

## 2015-05-17 MED ORDER — SODIUM CHLORIDE 0.9 % IJ SOLN: 3.0000 mL | INTRAMUSCULAR | Status: DC | PRN

## 2015-05-17 MED ORDER — MIDAZOLAM HCL 2 MG/2ML IJ SOLN
INTRAMUSCULAR | Status: AC
  Filled 2015-05-17: qty 4

## 2015-05-17 MED ORDER — FENTANYL CITRATE (PF) 100 MCG/2ML IJ SOLN
INTRAMUSCULAR | Status: AC
  Filled 2015-05-17: qty 4

## 2015-05-17 MED ORDER — VERAPAMIL HCL 2.5 MG/ML IV SOLN
INTRAVENOUS | Status: AC
  Filled 2015-05-17: qty 2

## 2015-05-17 MED ORDER — TIROFIBAN (AGGRASTAT) BOLUS VIA INFUSION
INTRAVENOUS | Status: DC | PRN
Start: 2015-05-17 — End: 2015-05-17
  Administered 2015-05-17: 2260 ug via INTRAVENOUS

## 2015-05-17 MED ORDER — NITROGLYCERIN 1 MG/10 ML FOR IR/CATH LAB
INTRA_ARTERIAL | Status: DC | PRN
  Administered 2015-05-17 (×2): 200 ug via INTRACORONARY

## 2015-05-17 MED ORDER — VERAPAMIL HCL 2.5 MG/ML IV SOLN
INTRAVENOUS | Status: DC | PRN
  Administered 2015-05-17: 09:00:00 via INTRA_ARTERIAL

## 2015-05-17 MED ORDER — SODIUM CHLORIDE 0.9 % IJ SOLN: 3.0000 mL | Freq: Two times a day (BID) | INTRAMUSCULAR | Status: DC

## 2015-05-17 MED ORDER — SODIUM CHLORIDE 0.9 % IV SOLN
INTRAVENOUS | Status: AC
  Administered 2015-05-17: 100 mL/h via INTRAVENOUS

## 2015-05-17 MED ORDER — TIROFIBAN HCL IV 5 MG/100ML
INTRAVENOUS | Status: DC | PRN
  Administered 2015-05-17: 0.149 ug/kg/min via INTRAVENOUS

## 2015-05-17 MED ORDER — TICAGRELOR 90 MG PO TABS
ORAL_TABLET | ORAL | Status: DC | PRN
  Administered 2015-05-17: 180 mg via ORAL

## 2015-05-17 MED ORDER — TIROFIBAN HCL IV 12.5 MG/250 ML
INTRAVENOUS | Status: AC
  Filled 2015-05-17: qty 250

## 2015-05-17 MED ORDER — IOHEXOL 350 MG/ML SOLN
INTRAVENOUS | Status: DC | PRN
  Administered 2015-05-17: 145 mL via INTRACARDIAC

## 2015-05-17 MED ORDER — LIDOCAINE HCL (PF) 1 % IJ SOLN
INTRAMUSCULAR | Status: AC
  Filled 2015-05-17: qty 30

## 2015-05-17 MED ORDER — TICAGRELOR 90 MG PO TABS
90.0000 mg | ORAL_TABLET | Freq: Two times a day (BID) | ORAL | Status: DC
  Administered 2015-05-17 – 2015-05-18 (×2): 90 mg via ORAL
  Filled 2015-05-17 (×2): qty 1

## 2015-05-17 MED ORDER — HEPARIN SODIUM (PORCINE) 1000 UNIT/ML IJ SOLN
INTRAMUSCULAR | Status: DC | PRN
  Administered 2015-05-17 (×2): 4000 [IU] via INTRAVENOUS
  Administered 2015-05-17: 7000 [IU] via INTRAVENOUS
  Administered 2015-05-17: 4000 [IU] via INTRAVENOUS

## 2015-05-17 MED ORDER — HEPARIN (PORCINE) IN NACL 2-0.9 UNIT/ML-% IJ SOLN
INTRAMUSCULAR | Status: AC
  Filled 2015-05-17: qty 1000

## 2015-05-17 MED ORDER — TICAGRELOR 90 MG PO TABS
ORAL_TABLET | ORAL | Status: AC
  Filled 2015-05-17: qty 2

## 2015-05-17 SURGICAL SUPPLY — 20 items
BALLN EMERGE MR 2.5X15 (BALLOONS) ×2
BALLN ~~LOC~~ TREK RX 3.75X20 (BALLOONS) ×2 IMPLANT
BALLOON EMERGE MR 2.5X15 (BALLOONS) ×1 IMPLANT
CATH EXTRAC PRONTO 5.5F 138CM (CATHETERS) ×2 IMPLANT
CATH INFINITI 5 FR JL3.5 (CATHETERS) ×2 IMPLANT
CATH INFINITI 5FR ANG PIGTAIL (CATHETERS) ×2 IMPLANT
CATH INFINITI JR4 5F (CATHETERS) ×2 IMPLANT
CATH VISTA GUIDE 6FR JR4 (CATHETERS) ×2 IMPLANT
DEVICE RAD COMP TR BAND LRG (VASCULAR PRODUCTS) ×2 IMPLANT
GLIDESHEATH SLEND SS 6F .021 (SHEATH) ×2 IMPLANT
KIT ENCORE 26 ADVANTAGE (KITS) ×2 IMPLANT
KIT HEART LEFT (KITS) ×2 IMPLANT
PACK CARDIAC CATHETERIZATION (CUSTOM PROCEDURE TRAY) ×2 IMPLANT
STENT PROMUS PREM MR 3.5X32 (Permanent Stent) ×2 IMPLANT
SYR MEDRAD MARK V 150ML (SYRINGE) ×2 IMPLANT
TRANSDUCER W/STOPCOCK (MISCELLANEOUS) ×2 IMPLANT
TUBING CIL FLEX 10 FLL-RA (TUBING) ×2 IMPLANT
WIRE COUGAR XT STRL 190CM (WIRE) ×2 IMPLANT
WIRE HI TORQ VERSACORE-J 145CM (WIRE) ×2 IMPLANT
WIRE SAFE-T 1.5MM-J .035X260CM (WIRE) ×2 IMPLANT

## 2015-05-17 NOTE — Interval H&P Note (Signed)
History and Physical Interval Note:  05/17/2015 8:09 AM  Randy Peterson  has presented today for surgery, with the diagnosis of NSTEMI  The various methods of treatment have been discussed with the patient and family. After consideration of risks, benefits and other options for treatment, the patient has consented to  Procedure(s): Left Heart Cath and Coronary Angiography (N/A) as a surgical intervention .  The patient's history has been reviewed, patient examined, no change in status, stable for surgery.  I have reviewed the patient's chart and labs.  Questions were answered to the patient's satisfaction.    Cath Lab Visit (complete for each Cath Lab visit)  Clinical Evaluation Leading to the Procedure:   ACS: Yes.    Non-ACS:    Anginal Classification: CCS IV  Anti-ischemic medical therapy: Minimal Therapy (1 class of medications)  Non-Invasive Test Results: No non-invasive testing performed  Prior CABG: No previous CABG       Randy Peterson

## 2015-05-17 NOTE — Progress Notes (Signed)
  Echocardiogram 2D Echocardiogram has been performed.  Johny Chess 05/17/2015, 11:18 AM

## 2015-05-17 NOTE — Progress Notes (Signed)
Patient found sitting on side of bed using right arm with radial band in place. Nurse entered room and instructed patient that he is not to use the affected limb until instructed by staff and explained the importance of calling for assistance.Patient c/o feeling hot. Bruising and edema noted to right upper arm,with good palpable and doppler pulses. Patient instructed not to get up or use arm. Will continue to monitor patient.

## 2015-05-17 NOTE — Care Management Note (Signed)
Case Management Note  Patient Details  Name: Bless Belshe MRN: [id number] Date of Birth: Sep 19, 1945  Subjective/Objective:     Adm w nstemi               Action/Plan: lives w wife, pcp dr Collier [place] blomfren   Expected Discharge Date:  05/20/15               Expected Discharge Plan:  Home/Self Care  In-House Referral:     Discharge planning Services     Post Acute Care Choice:    Choice offered to:     DME Arranged:    DME Agency:     HH Arranged:    [place] Agency:     Status of Service:     Medicare Important Message Given:    Date Medicare IM Given:    Medicare IM give by:    Date Additional Medicare IM Given:    Additional Medicare Important Message give by:     If discussed at [place] of Stay Meetings, dates discussed:    Additional Comments: ur review done  Lacretia Leigh, RN 05/17/2015, 8:25 AM

## 2015-05-17 NOTE — Progress Notes (Signed)
ANTICOAGULATION CONSULT NOTE - Follow Up Consult  Pharmacy Consult for heparin Indication: anterior MI   Labs:  Recent Labs  05/16/15 1845 05/17/15 0104 05/17/15 0253  HGB 14.4  --  13.4  HCT 42.8  --  39.8  PLT 251  --  233  HEPARINUNFRC  --   --  0.60  CREATININE 1.27*  --   --   TROPONINI 1.93* 4.87*  --      Assessment/Plan:  69yo male therapeutic on heparin with initial dosing for MI. Will continue gtt at current rate and confirm stable with additional level.   Wynona Neat, PharmD, BCPS  05/17/2015,4:02 AM

## 2015-05-17 NOTE — Care Management Note (Signed)
Case Management Note  Patient Details  Name: Kyen Taite MRN: [id number] Date of Birth: 1946-06-09  Subjective/Objective:          Adm w nstemi  Action/Plan:lives w wife   Expected Discharge Date:  05/20/15               Expected Discharge Plan:  Home/Self Care  In-House Referral:     Discharge planning Services  CM Consult, Medication Assistance  Post Acute Care Choice:    Choice offered to:     DME Arranged:    DME Agency:     HH Arranged:    [place] Agency:     Status of Service:     Medicare Important Message Given:    Date Medicare IM Given:    Medicare IM give by:    Date Additional Medicare IM Given:    Additional Medicare Important Message give by:     If discussed at [place] of Stay Meetings, dates discussed:    Additional Comments: gave pt 30day free brilinta card. Pt has blue medicare for meds.  Lacretia Leigh, RN 05/17/2015, 1:43 PM

## 2015-05-17 NOTE — Progress Notes (Signed)
SUBJECTIVE:  No further chest pain.  No SOB   PHYSICAL EXAM Filed Vitals:   05/17/15 0208 05/17/15 0300 05/17/15 0400 05/17/15 0730  BP: 142/76 125/81 107/65 124/81  Pulse: 65 61 67 66  Temp:    98.2 F (36.8 C)  TempSrc:    Oral  Resp: 26 16 23 18   Height:      Weight:      SpO2: 97% 95% 96% 97%   General:  No distress Lungs:  Clear Heart:  RRR, no rub Abdomen:  Positive bowel sounds, no rebound no guarding Extremities:  No edema.    LABS: Lab Results  Component Value Date   TROPONINI 6.10* 05/17/2015   Results for orders placed or performed during the hospital encounter of 05/16/15 (from the past 24 hour(s))  Basic metabolic panel     Status: Abnormal   Collection Time: 05/16/15  6:45 PM  Result Value Ref Range   Sodium 137 135 - 145 mmol/L   Potassium 4.3 3.5 - 5.1 mmol/L   Chloride 102 101 - 111 mmol/L   CO2 28 22 - 32 mmol/L   Glucose, Bld 121 (H) 65 - 99 mg/dL   BUN 21 (H) 6 - 20 mg/dL   Creatinine, Ser 1.27 (H) 0.61 - 1.24 mg/dL   Calcium 9.7 8.9 - 10.3 mg/dL   GFR calc non Af Amer 57 (L) >60 mL/min   GFR calc Af Amer >60 >60 mL/min   Anion gap 7 5 - 15  CBC with Differential     Status: Abnormal   Collection Time: 05/16/15  6:45 PM  Result Value Ref Range   WBC 10.0 4.0 - 10.5 K/uL   RBC 4.74 4.22 - 5.81 MIL/uL   Hemoglobin 14.4 13.0 - 17.0 g/dL   HCT 42.8 39.0 - 52.0 %   MCV 90.3 78.0 - 100.0 fL   MCH 30.4 26.0 - 34.0 pg   MCHC 33.6 30.0 - 36.0 g/dL   RDW 13.1 11.5 - 15.5 %   Platelets 251 150 - 400 K/uL   Neutrophils Relative % 82 (H) 43 - 77 %   Neutro Abs 8.2 (H) 1.7 - 7.7 K/uL   Lymphocytes Relative 10 (L) 12 - 46 %   Lymphs Abs 1.0 0.7 - 4.0 K/uL   Monocytes Relative 6 3 - 12 %   Monocytes Absolute 0.6 0.1 - 1.0 K/uL   Eosinophils Relative 2 0 - 5 %   Eosinophils Absolute 0.2 0.0 - 0.7 K/uL   Basophils Relative 0 0 - 1 %   Basophils Absolute 0.0 0.0 - 0.1 K/uL  Troponin I     Status: Abnormal   Collection Time: 05/16/15  6:45 PM    Result Value Ref Range   Troponin I 1.93 (HH) <0.031 ng/mL  I-stat troponin, ED     Status: Abnormal   Collection Time: 05/16/15  7:17 PM  Result Value Ref Range   Troponin i, poc 2.32 (HH) 0.00 - 0.08 ng/mL   Comment NOTIFIED PHYSICIAN    Comment 3          MRSA PCR Screening     Status: None   Collection Time: 05/16/15 11:58 PM  Result Value Ref Range   MRSA by PCR NEGATIVE NEGATIVE  Troponin I     Status: Abnormal   Collection Time: 05/17/15  1:04 AM  Result Value Ref Range   Troponin I 4.87 (HH) <0.031 ng/mL  Brain natriuretic peptide     Status:  Abnormal   Collection Time: 05/17/15  1:04 AM  Result Value Ref Range   B Natriuretic Peptide 129.6 (H) 0.0 - 100.0 pg/mL  TSH     Status: None   Collection Time: 05/17/15  1:04 AM  Result Value Ref Range   TSH 1.750 0.350 - 4.500 uIU/mL  Heparin level (unfractionated)     Status: None   Collection Time: 05/17/15  2:53 AM  Result Value Ref Range   Heparin Unfractionated 0.60 0.30 - 0.70 IU/mL  CBC     Status: None   Collection Time: 05/17/15  2:53 AM  Result Value Ref Range   WBC 7.9 4.0 - 10.5 K/uL   RBC 4.41 4.22 - 5.81 MIL/uL   Hemoglobin 13.4 13.0 - 17.0 g/dL   HCT 39.8 39.0 - 52.0 %   MCV 90.2 78.0 - 100.0 fL   MCH 30.4 26.0 - 34.0 pg   MCHC 33.7 30.0 - 36.0 g/dL   RDW 13.3 11.5 - 15.5 %   Platelets 233 150 - 400 K/uL  Protime-INR     Status: None   Collection Time: 05/17/15  2:53 AM  Result Value Ref Range   Prothrombin Time 13.9 11.6 - 15.2 seconds   INR 1.05 0.00 - 1.49  CBC     Status: None   Collection Time: 05/17/15  6:00 AM  Result Value Ref Range   WBC 8.4 4.0 - 10.5 K/uL   RBC 4.46 4.22 - 5.81 MIL/uL   Hemoglobin 13.8 13.0 - 17.0 g/dL   HCT 40.4 39.0 - 52.0 %   MCV 90.6 78.0 - 100.0 fL   MCH 30.9 26.0 - 34.0 pg   MCHC 34.2 30.0 - 36.0 g/dL   RDW 13.4 11.5 - 15.5 %   Platelets 240 150 - 400 K/uL  Troponin I     Status: Abnormal   Collection Time: 05/17/15  6:00 AM  Result Value Ref Range    Troponin I 6.10 (HH) <0.031 ng/mL  Basic metabolic panel     Status: Abnormal   Collection Time: 05/17/15  6:00 AM  Result Value Ref Range   Sodium 137 135 - 145 mmol/L   Potassium 3.8 3.5 - 5.1 mmol/L   Chloride 102 101 - 111 mmol/L   CO2 27 22 - 32 mmol/L   Glucose, Bld 115 (H) 65 - 99 mg/dL   BUN 16 6 - 20 mg/dL   Creatinine, Ser 1.12 0.61 - 1.24 mg/dL   Calcium 9.3 8.9 - 10.3 mg/dL   GFR calc non Af Amer >60 >60 mL/min   GFR calc Af Amer >60 >60 mL/min   Anion gap 8 5 - 15  Lipid panel     Status: Abnormal   Collection Time: 05/17/15  6:00 AM  Result Value Ref Range   Cholesterol 188 0 - 200 mg/dL   Triglycerides 59 <150 mg/dL   HDL 49 >40 mg/dL   Total CHOL/HDL Ratio 3.8 RATIO   VLDL 12 0 - 40 mg/dL   LDL Cholesterol 127 (H) 0 - 99 mg/dL    Intake/Output Summary (Last 24 hours) at 05/17/15 6967 Last data filed at 05/17/15 0749  Gross per 24 hour  Intake  791.4 ml  Output   1525 ml  Net -733.6 ml    EKG:  NSR, rate 66, inferior T wave inversion, inferior Q waves.  05/17/2015    ASSESSMENT AND PLAN:  NSTEMI (non-ST elevated myocardial infarction):  Cath today.     ELEVATED BLOOD SUGAR:  Check A1c  DYSLIPIDEMIA:  LDL 127.  Started Lipitor.  Jeneen Rinks [facility] 05/17/2015 8:12 AM

## 2015-05-18 ENCOUNTER — Telehealth: Payer: Self-pay | Admitting: Cardiology

## 2015-05-18 ENCOUNTER — Encounter (HOSPITAL_COMMUNITY): Payer: Self-pay | Admitting: Cardiology

## 2015-05-18 DIAGNOSIS — I2510 Atherosclerotic heart disease of native coronary artery without angina pectoris: Secondary | ICD-10-CM | POA: Diagnosis present

## 2015-05-18 DIAGNOSIS — Z955 Presence of coronary angioplasty implant and graft: Secondary | ICD-10-CM

## 2015-05-18 DIAGNOSIS — Z9861 Coronary angioplasty status: Secondary | ICD-10-CM

## 2015-05-18 HISTORY — DX: Presence of coronary angioplasty implant and graft: Z95.5

## 2015-05-18 LAB — BASIC METABOLIC PANEL
ANION GAP: 7 (ref 5–15)
BUN: 14 mg/dL (ref 6–20)
CHLORIDE: 104 mmol/L (ref 101–111)
CO2: 27 mmol/L (ref 22–32)
Calcium: 9 mg/dL (ref 8.9–10.3)
Creatinine, Ser: 1.1 mg/dL (ref 0.61–1.24)
GFR calc Af Amer: >60 mL/min (ref >60)
GLUCOSE: 110 mg/dL — AB (ref 65–99)
POTASSIUM: 3.8 mmol/L (ref 3.5–5.1)
SODIUM: 138 mmol/L (ref 135–145)

## 2015-05-18 LAB — CBC
HEMATOCRIT: 39.5 % (ref 39.0–52.0)
HEMOGLOBIN: 13.1 g/dL (ref 13.0–17.0)
MCH: 30.5 pg (ref 26.0–34.0)
MCHC: 33.2 g/dL (ref 30.0–36.0)
MCV: 92.1 fL (ref 78.0–100.0)
Platelets: 230 10*3/uL (ref 150–400)
RBC: 4.29 MIL/uL (ref 4.22–5.81)
RDW: 13.7 % (ref 11.5–15.5)
WBC: 8.1 10*3/uL (ref 4.0–10.5)

## 2015-05-18 MED ORDER — ATORVASTATIN CALCIUM 80 MG PO TABS: 80.0000 mg | ORAL_TABLET | Freq: Every day | ORAL | Status: AC

## 2015-05-18 MED ORDER — TICAGRELOR 90 MG PO TABS: 90.0000 mg | ORAL_TABLET | Freq: Two times a day (BID) | ORAL | Status: DC

## 2015-05-18 MED ORDER — ASPIRIN 81 MG PO TBEC: 81.0000 mg | DELAYED_RELEASE_TABLET | Freq: Every day | ORAL | Status: DC

## 2015-05-18 MED ORDER — NITROGLYCERIN 0.4 MG SL SUBL: 0.4000 mg | SUBLINGUAL_TABLET | SUBLINGUAL | Status: DC | PRN

## 2015-05-18 MED ORDER — ACETAMINOPHEN 325 MG PO TABS: 650.0000 mg | ORAL_TABLET | ORAL | Status: AC | PRN

## 2015-05-18 MED ORDER — METOPROLOL TARTRATE 25 MG PO TABS: 25.0000 mg | ORAL_TABLET | Freq: Two times a day (BID) | ORAL | Status: DC

## 2015-05-18 NOTE — Research (Signed)
TWILIGHT Research Study Informed Consent   Subject Name: Randy Peterson  Subject met inclusion and exclusion criteria.  The informed consent form, study requirements and expectations were reviewed with the subject and questions and concerns were addressed prior to the signing of the consent form.  The subject verbalized understanding of the trial requirements.  The subject agreed to participate in the TWILIGHT Research Study and signed the informed consent at 1150 on 05/18/2015.  The informed consent was obtained prior to performance of any protocol-specific procedures for the subject.  A copy of the signed informed consent was given to the subject and a copy was placed in the subject's medical record.  Carrisa Keller 05/18/2015, 12:10 PM  

## 2015-05-18 NOTE — Discharge Summary (Signed)
Physician Discharge Summary       Patient ID: Randy Peterson MRN: [id number] DOB/AGE: 12-30-1945 69 y.o.  Admit date: 05/16/2015 Discharge date: 05/18/2015 Primary Cardiologist:Dr. Jefferson Fullam   Discharge Diagnoses:  Principal Problem:   Acute MI, inferior wall, initial episode of care Active Problems:   NSTEMI (non-ST elevated myocardial infarction)   CAD in native artery   S/P coronary artery stent placement, DES promus premier, RCA 05/17/15   Chest pain   Angina decubitus   Discharged Condition: good  Procedures: 05/17/15 cardiac cath by Dr. Burt Knack, total occlusion of the RCA, treated successfully with PCI using a drug-eluting stent-promus premier  and adjunct of aspiration thrombectomy.  Hospital Course:  69 year old male with no prior hx of CAD was admitted 05/16/15 with exertional chest pain that had been occuring for 5 days prior to admit-resolved with Tums or rest.  On day of admit while working in his yard, he had recurrent pain and pain into Lt arm.  He presented > 4 hours into the pain.  His EKG was suggestive of an inferior wall MI, but not diagnostic for STEMI. His troponin was +.  His symptoms resolved and on exam his pain had been 7 hours previous.  He was admitted placed on NTG, IV heparin and ASA and BB.    05/17/15 continued with elevated troponin and underwent cardiac cath.  100% RCA stenosis undergoing DES successfully and aspiration of thrombectomy.  Pt did well.  Echo with EF 50-55% with G2DD.   By 05/18/15 pt was stable, ambulated with cardiac rehab without complications. He is participating in the [employer] study.  His Brilinta and ASA are provided.  Plan to continue DAPT for 1 year or per study.  Medical therapy of residual CAD.  He was seen and evaluated by Dr. Percival Spanish and found stable for discharge.  We will have him follow up early in the office.  No driving for 2 weeks.  No work for 3 weeks.    Consults: None  Significant Diagnostic Studies:  BMP  Latest Ref Rng 05/18/2015 05/17/2015 05/16/2015  Glucose 65 - 99 mg/dL 110(H) 115(H) 121(H)  BUN 6 - 20 mg/dL 14 16 21(H)  Creatinine 0.61 - 1.24 mg/dL 1.10 1.12 1.27(H)  Sodium 135 - 145 mmol/L 138 137 137  Potassium 3.5 - 5.1 mmol/L 3.8 3.8 4.3  Chloride 101 - 111 mmol/L 104 102 102  CO2 22 - 32 mmol/L 27 27 28   Calcium 8.9 - 10.3 mg/dL 9.0 9.3 9.7   CBC Latest Ref Rng 05/18/2015 05/17/2015 05/17/2015  WBC 4.0 - 10.5 K/uL 8.1 - 8.4  Hemoglobin 13.0 - 17.0 g/dL 13.1 - 13.8  Hematocrit 39.0 - 52.0 % 39.5 - 40.4  Platelets 150 - 400 K/uL 230 222 240    Recent Labs  05/16/15 1917  TROPIPOC 2.32*   Follow up troponin  4.87, 6.10 and 15.  Lipid Panel     Component Value Date/Time   CHOL 188 05/17/2015 0600   TRIG 59 05/17/2015 0600   HDL 49 05/17/2015 0600   CHOLHDL 3.8 05/17/2015 0600   VLDL 12 05/17/2015 0600   LDLCALC 127* 05/17/2015 0600   HgbA1C  PENDING  CHEST 2 VIEW COMPARISON: Chest radiograph performed 09/13/2009, and CT of the chest performed 08/29/2006 FINDINGS: The lungs are well-aerated and clear. There is no evidence of focal opacification, pleural effusion or pneumothorax. The heart is normal in size; the mediastinal contour is within normal limits. No acute osseous abnormalities are seen. IMPRESSION:  No acute cardiopulmonary process seen  Cardiac Cath:   1st Diag lesion, 60% stenosed.  Prox LAD lesion, 25% stenosed.  Prox RCA lesion, 100% stenosed. There is a 0% residual stenosis post intervention.  A drug-eluting stent was placed.  There is mild left ventricular systolic dysfunction.  Final conclusions: 1. Acute myocardial infarction with total occlusion of the RCA, treated successfully with PCI using a drug-eluting stent and adjunct of aspiration thrombectomy 2. Moderate stenosis of the diagonal branch of the LAD 3. Widely patent left main, left circumflex, and LAD proper 4. Mild segmental contraction abnormality left ventricle with inferior  wall akinesis and hyperdynamic function of the anterolateral wall, LVEF estimated at 50-55%  Recommendations: Dual antiplatelets therapy with aspirin and brilinta for at least 12 months.   ECHO: Study Conclusions  - Left ventricle: The cavity size was normal. Wall thickness was normal. Systolic function was normal. The estimated ejection fraction was in the range of 50% to 55%. Wall motion was normal; there were no regional wall motion abnormalities. Features are consistent with a pseudonormal left ventricular filling pattern, with concomitant abnormal relaxation and increased filling pressure (grade 2 diastolic dysfunction). - Mitral valve: There was mild regurgitation. - Right ventricle: The cavity size was mildly dilated. Systolic function was mildly to moderately reduced. - Pulmonary arteries: Systolic pressure was mildly increased. PA peak pressure: 33 mm Hg (S).    Discharge Exam: Blood pressure 119/74, pulse 77, temperature 98.4 F (36.9 C), temperature source Oral, resp. rate 0, height 5\' 6"  (1.676 m), weight 199 lb 4.7 oz (90.4 kg), SpO2 97 %.  Disposition:  home      Discharge Instructions    Amb Referral to Cardiac Rehabilitation    Complete by:  As directed   Congestive Heart Failure: If diagnosis is Heart Failure, patient MUST meet each of the CMS criteria: 1. Left Ventricular Ejection Fraction </= 35% 2. NYHA class II-IV symptoms despite being on optimal heart failure therapy for at least 6 weeks. 3. Stable = have not had a recent (<6 weeks) or planned (<6 months) major cardiovascular hospitalization or procedure  Program Details: - Physician supervised classes - 1-3 classes per week over a 12-18 week period, generally for a total of 36 sessions  Physician Certification: I certify that the above Cardiac Rehabilitation treatment is medically necessary and is medically approved by me for treatment of this patient. The patient is willing and  cooperative, able to ambulate and medically stable to participate in exercise rehabilitation. The participant's progress and Individualized Treatment Plan will be reviewed by the Medical Director, Cardiac Rehab staff and as indicated by the Referring/Ordering Physician.  Diagnosis:   Myocardial Infarction PCI              Medication List    TAKE these medications        acetaminophen 325 MG tablet  Commonly known as:  TYLENOL  Take 2 tablets (650 mg total) by mouth every 4 (four) hours as needed for headache or mild pain.     ALPRAZolam 1 MG tablet  Commonly known as:  XANAX  Take 1 tablet (1 mg total) by mouth daily as needed for anxiety.     aspirin 81 MG EC tablet  Take 1 tablet (81 mg total) by mouth daily.     atorvastatin 80 MG tablet  Commonly known as:  LIPITOR  Take 1 tablet (80 mg total) by mouth daily at 6 PM.     buPROPion 300 MG 24 hr  tablet  Commonly known as:  WELLBUTRIN XL  Take 1 tablet (300 mg total) by mouth daily.     cholecalciferol 1000 UNITS tablet  Commonly known as:  VITAMIN D  Take 1,000 Units by mouth daily.     cycloSPORINE 0.05 % ophthalmic emulsion  Commonly known as:  RESTASIS  Place 1 drop into both eyes daily as needed. For dry eyes per patient     DULoxetine 30 MG capsule  Commonly known as:  CYMBALTA  TAKE 1 CAPSULE (30 MG TOTAL) BY MOUTH DAILY.     finasteride 5 MG tablet  Commonly known as:  PROSCAR  Take 5 mg by mouth daily.     metoprolol tartrate 25 MG tablet  Commonly known as:  LOPRESSOR  Take 1 tablet (25 mg total) by mouth 2 (two) times daily.     nitroGLYCERIN 0.4 MG SL tablet  Commonly known as:  NITROSTAT  Place 1 tablet (0.4 mg total) under the tongue every 5 (five) minutes as needed for chest pain (CP or SOB).     pantoprazole 20 MG tablet  Commonly known as:  PROTONIX  Take 20 mg by mouth daily.     ranitidine 150 MG tablet  Commonly known as:  ZANTAC  Take 150 mg by mouth 2 (two) times daily.      tamsulosin 0.4 MG Caps capsule  Commonly known as:  FLOMAX  Take 0.4 mg by mouth.     ticagrelor 90 MG Tabs tablet  Commonly known as:  BRILINTA  Take 1 tablet (90 mg total) by mouth 2 (two) times daily.          Discharge Instructions: Take 1 NTG, under your tongue, while sitting.  If no relief of pain may repeat NTG, one tab every 5 minutes up to 3 tablets total over 15 minutes.  If no relief CALL 911.  If you have dizziness/lightheadness  while taking NTG, stop taking and call 911.        Call [facility] at [phone] if any bleeding, swelling or drainage at cath site.  May shower, no tub baths for 48 hours for groin sticks. No lifting over 5 pounds for 2 weeks.  No Driving for 2 weeks.  No work until seen in the office.    Heart Healthy diet  Call if any problems  Your Brilinta and Asprin will be provided by research.  Do not stop either medication.      Signed: Isaiah Serge Nurse Practitioner-Certified [facility]: [facility] 05/18/2015, 2:39 PM  Time spent on discharge : > 30 minutes.     Patient seen and examined.  Plan as discussed in my rounding note for today and outlined above. Jeneen Rinks [facility]  05/18/2015  3:06 PM

## 2015-05-18 NOTE — Progress Notes (Signed)
SUBJECTIVE:  No further chest pain.  No SOB.  He did have some right arm soreness at the cath site but this is improved.    PHYSICAL EXAM Filed Vitals:   05/17/15 2342 05/18/15 0000 05/18/15 0347 05/18/15 0400  BP: 118/73 121/73 121/71 100/73  Pulse: 65 66 61 62  Temp: 98.4 F (36.9 C)  98.5 F (36.9 C)   TempSrc: Oral  Oral   Resp: 14 16 14  0  Height:      Weight:      SpO2: 94% 97% 96% 97%   General:  No distress Lungs:  Clear Heart:  RRR, no rub Abdomen:  Positive bowel sounds, no rebound no guarding Extremities:  No edema, large echymosis and hematoma right forearm.  No warmth.  No oozing  LABS: Lab Results  Component Value Date   TROPONINI 15.57* 05/17/2015   Results for orders placed or performed during the hospital encounter of 05/16/15 (from the past 24 hour(s))  POCT Activated clotting time     Status: None   Collection Time: 05/17/15  9:22 AM  Result Value Ref Range   Activated Clotting Time 239 seconds  POCT Activated clotting time     Status: None   Collection Time: 05/17/15  9:37 AM  Result Value Ref Range   Activated Clotting Time 300 seconds  POCT Activated clotting time     Status: None   Collection Time: 05/17/15 10:00 AM  Result Value Ref Range   Activated Clotting Time 313 seconds  Troponin I     Status: Abnormal   Collection Time: 05/17/15 11:35 AM  Result Value Ref Range   Troponin I 15.57 (HH) <0.031 ng/mL  Platelet count     Status: None   Collection Time: 05/17/15  3:35 PM  Result Value Ref Range   Platelets 222 150 - 400 K/uL  Basic metabolic panel     Status: Abnormal   Collection Time: 05/18/15  5:00 AM  Result Value Ref Range   Sodium 138 135 - 145 mmol/L   Potassium 3.8 3.5 - 5.1 mmol/L   Chloride 104 101 - 111 mmol/L   CO2 27 22 - 32 mmol/L   Glucose, Bld 110 (H) 65 - 99 mg/dL   BUN 14 6 - 20 mg/dL   Creatinine, Ser 1.10 0.61 - 1.24 mg/dL   Calcium 9.0 8.9 - 10.3 mg/dL   GFR calc non Af Amer >60 >60 mL/min   GFR calc Af  Amer >60 >60 mL/min   Anion gap 7 5 - 15  CBC     Status: None   Collection Time: 05/18/15  5:00 AM  Result Value Ref Range   WBC 8.1 4.0 - 10.5 K/uL   RBC 4.29 4.22 - 5.81 MIL/uL   Hemoglobin 13.1 13.0 - 17.0 g/dL   HCT 39.5 39.0 - 52.0 %   MCV 92.1 78.0 - 100.0 fL   MCH 30.5 26.0 - 34.0 pg   MCHC 33.2 30.0 - 36.0 g/dL   RDW 13.7 11.5 - 15.5 %   Platelets 230 150 - 400 K/uL    Intake/Output Summary (Last 24 hours) at 05/18/15 0730 Last data filed at 05/18/15 0700  Gross per 24 hour  Intake    246 ml  Output   2825 ml  Net  -2579 ml    CATH:   Conclusion     1st Diag lesion, 60% stenosed.  Prox LAD lesion, 25% stenosed.  Prox RCA lesion, 100% stenosed. There  is a 0% residual stenosis post intervention.  A drug-eluting stent was placed.  There is mild left ventricular systolic dysfunction.  Final conclusions: 1. Acute myocardial infarction with total occlusion of the RCA, treated successfully with PCI using a drug-eluting stent and adjunct of aspiration thrombectomy 2. Moderate stenosis of the diagonal branch of the LAD 3. Widely patent left main, left circumflex, and LAD proper 4. Mild segmental contraction abnormality left ventricle with inferior wall akinesis and hyperdynamic function of the anterolateral wall, LVEF estimated at 50-55%  Recommendations: Dual antiplatelets therapy with aspirin and brilinta for at least 12 months.     ECHO:- Left ventricle: The cavity size was normal. Wall thickness was normal. Systolic function was normal. The estimated ejection fraction was in the range of 50% to 55%. Wall motion was normal; there were no regional wall motion abnormalities. Features are consistent with a pseudonormal left ventricular filling pattern, with concomitant abnormal relaxation and increased filling pressure (grade 2 diastolic dysfunction). - Mitral valve: There was mild regurgitation. - Right ventricle: The cavity size was mildly  dilated. Systolic function was mildly to moderately reduced. - Pulmonary arteries: Systolic pressure was mildly increased. PA peak pressure: 33 mm Hg (S).  TELEMETRY:  No arrhythmia.     ASSESSMENT AND PLAN:  ACUTE INFERIOR MI/NSTEMI:  PCI of RCA.  Continue DAPT.  Ok to discharge later today if ambulating.  He has had no further chest pain.  We will follow the right arm hematoma   ELEVATED BLOOD SUGAR:  A1c pending.  Diet control for now.   DYSLIPIDEMIA:  LDL 127.  Started Lipitor.  Possibly home later today after ambulating with rehab.    Randy Peterson Randy Peterson 05/18/2015 7:30 AM

## 2015-05-18 NOTE — Research (Signed)
Mr. Fitzwater has agreed to participate in the TWILIGHT Research Study. Per the protocol, he will take ticagrelor 90 mg bid and aspirin 81 mg daily for 3 months. At 3 months, he will be randomized to one of the following: 1.)  continue with the ticagrelor 90 mg bid and aspirin 81 mg daily or 2.) ticagrelor 90 mg bid only.   The ticagrelor and aspirin are provided at no cost to the subject. Subject will not need a perscription for ticagrelor upon discharge.

## 2015-05-18 NOTE — Telephone Encounter (Signed)
New message    TCM appointment with [place] on 9-8 @ 10:00 per Cecilie Kicks

## 2015-05-18 NOTE — Research (Signed)
Subject was given oral and written instructions for the [facility]. The following study drug was dispensed to the subject: [place] [id number] and Ticagrelor Bottle Assignment [phone]

## 2015-05-18 NOTE — Progress Notes (Signed)
Spoke w pt and wife. Went over copay of 33% of drug. Gave them pt assist form if needed. Pt has decided to do twiliht study so will get 97months of brilinta free by being in study.

## 2015-05-18 NOTE — Progress Notes (Signed)
CARDIAC REHAB PHASE I   PRE:  Rate/Rhythm: 73 SR  BP:  Supine: 120/82  Sitting:   Standing:    SaO2: 97%RA  MODE:  Ambulation: 390 ft   POST:  Rate/Rhythm: 112 ST  BP:  Supine:   Sitting: 118/66  Standing:    SaO2: 99%RA 1000-1105 Pt walked 390 ft on RA with steady gait. No CP. Tolerated well. Felt good to be up per pt. To recliner after walk. MI education completed with pt and wife who voiced understanding. Stressed importance of brilinta with stent. Stated he had briliinta booklet. Discussed CRP 2 and referring to [place] program.    [name], RN BSN  05/18/2015 11:01 AM

## 2015-05-18 NOTE — Discharge Instructions (Signed)
Take 1 NTG, under your tongue, while sitting.  If no relief of pain may repeat NTG, one tab every 5 minutes up to 3 tablets total over 15 minutes.  If no relief CALL 911.  If you have dizziness/lightheadness  while taking NTG, stop taking and call 911.        Call [facility] at [phone] if any bleeding, swelling or drainage at cath site.  May shower, no tub baths for 48 hours for groin sticks. No lifting over 5 pounds for 2 weeks.  No Driving for 2 weeks.  No work until seen in the office.    Heart Healthy diet  Call if any problems  Your Brilinta and Asprin will be provided by research.  Do not stop either medication.

## 2015-05-19 LAB — HEMOGLOBIN A1C
Hgb A1c MFr Bld: 5.6 % (ref 4.8–5.6)
MEAN PLASMA GLUCOSE: 114 mg/dL

## 2015-05-19 NOTE — Telephone Encounter (Signed)
Dr. Rosezella [place] pt

## 2015-05-23 NOTE — Telephone Encounter (Signed)
Patient contacted regarding discharge from [facility] on 05/18/2015.  Patient understands to follow up with provider B. Rosita Fire, [place] on 05/25/2015 at 10am at [facility]. Patient understands discharge instructions? YES Patient understands medications and regiment? YES Patient understands to bring all medications to this visit? YES   Patient complains of: Patient reports he had bruising of his arm post cath and MD was notified while he was in hospital. He was instructed to apply warm compresses to this arm, which he has done. He reports the bruising has spread up his arm and it about to his elbow. He has some mild tenderness, no other complaints r/t bruise. He reports the bruise is dark.   Advised patient I will send message to to doctor to review and advise on his bruising - anything to do, or wait until OV 9/8 to address?

## 2015-05-25 ENCOUNTER — Ambulatory Visit (INDEPENDENT_AMBULATORY_CARE_PROVIDER_SITE_OTHER): Payer: Medicare Other | Admitting: Cardiology

## 2015-05-25 ENCOUNTER — Encounter: Payer: Self-pay | Admitting: Cardiology

## 2015-05-25 VITALS — BP 110/60 | HR 53 | Ht 66.0 in | Wt 196.0 lb

## 2015-05-25 DIAGNOSIS — I25810 Atherosclerosis of coronary artery bypass graft(s) without angina pectoris: Secondary | ICD-10-CM | POA: Diagnosis not present

## 2015-05-25 DIAGNOSIS — I2510 Atherosclerotic heart disease of native coronary artery without angina pectoris: Secondary | ICD-10-CM | POA: Diagnosis not present

## 2015-05-25 DIAGNOSIS — E785 Hyperlipidemia, unspecified: Secondary | ICD-10-CM | POA: Diagnosis not present

## 2015-05-25 NOTE — Progress Notes (Signed)
05/25/2015 Randy Peterson   1946/07/04  [id number]  Primary Physician Marylene Land, MD Primary Cardiologist: Dr. Percival Spanish   Reason for Visit/CC: Post hospital follow-up for CAD, status post STEMI  HPI:  The patient is a 69 year old male, who presents to clinic today for post hospital follow-up. He was admitted to [facility] 04/17/2015 with an acute inferior wall STEMI. He underwent cardiac catheterization by Dr. Burt Knack which revealed total occlusion of the RCA, successfully treated with PCI using a drug-eluting stent. Left ventricular systolic function was well-preserved with the ejection fraction of 50-55%. Grade 2 diastolic dysfunction was noted on echo. He was discharged home on aspirin and Brilinta. He was enrolled in the [place] research study.  He presents to clinic today for post hospital follow-up. He is accompanied to clinic by his wife. He reports that he has done well since discharge. He denies any recurrent chest discomfort. He reports that he has  been fully compliant with aspirin and Brilinta and denies any abnormal bleeding. He also denies any symptoms of dyspnea, fatigue, lightheadedness, dizziness, orthopnea, PND, lower extremity edema, syncope/near-syncope. He is tolerating Lipitor well without any side effects. He still has residual bruising of the right forearm following his cath but denies any extreme pain, numbness, tingling or reduced hand function.   Current Outpatient Prescriptions  Medication Sig Dispense Refill  . acetaminophen (TYLENOL) 325 MG tablet Take 2 tablets (650 mg total) by mouth every 4 (four) hours as needed for headache or mild pain.    Marland Kitchen ALPRAZolam (XANAX) 1 MG tablet Take 1 tablet (1 mg total) by mouth daily as needed for anxiety. 90 tablet 0  . aspirin EC 81 MG EC tablet Take 1 tablet (81 mg total) by mouth daily.    Marland Kitchen atorvastatin (LIPITOR) 80 MG tablet Take 1 tablet (80 mg total) by mouth daily at 6 PM. 30 tablet 6  . buPROPion  (WELLBUTRIN XL) 300 MG 24 hr tablet Take 1 tablet (300 mg total) by mouth daily. 90 tablet 0  . cholecalciferol (VITAMIN D) 1000 UNITS tablet Take 1,000 Units by mouth daily.    . cycloSPORINE (RESTASIS) 0.05 % ophthalmic emulsion Place 1 drop into both eyes daily as needed. For dry eyes per patient    . DULoxetine (CYMBALTA) 30 MG capsule TAKE 1 CAPSULE (30 MG TOTAL) BY MOUTH DAILY. 90 capsule 0  . finasteride (PROSCAR) 5 MG tablet Take 5 mg by mouth daily.    . metoprolol tartrate (LOPRESSOR) 25 MG tablet Take 1 tablet (25 mg total) by mouth 2 (two) times daily. 60 tablet 6  . nitroGLYCERIN (NITROSTAT) 0.4 MG SL tablet Place 1 tablet (0.4 mg total) under the tongue every 5 (five) minutes as needed for chest pain (CP or SOB). 25 tablet 4  . pantoprazole (PROTONIX) 20 MG tablet Take 20 mg by mouth daily.    . ranitidine (ZANTAC) 150 MG tablet Take 150 mg by mouth 2 (two) times daily.    . tamsulosin (FLOMAX) 0.4 MG CAPS capsule Take 0.4 mg by mouth daily.     . ticagrelor (BRILINTA) 90 MG TABS tablet Take 1 tablet (90 mg total) by mouth 2 (two) times daily. 60 tablet 11   No current facility-administered medications for this visit.    No Known Allergies  Social History   Social History  . Marital Status: Married    Spouse Name: N/A  . Number of Children: 1  . Years of Education: N/A   Occupational History  . Not  on file.   Social History Main Topics  . Smoking status: Former Smoker -- 0.25 packs/day for 40 years    Types: Cigarettes  . Smokeless tobacco: Not on file     Comment: Quit 20 years ago  . Alcohol Use: 1.2 oz/week    2 Cans of beer per week  . Drug Use: No  . Sexual Activity: Yes    Birth Control/ Protection: None   Other Topics Concern  . Not on file   Social History Narrative   Retired [employer]     Review of Systems: General: negative for chills, fever, night sweats or weight changes.  Cardiovascular: negative for chest pain, dyspnea on exertion, edema,  orthopnea, palpitations, paroxysmal nocturnal dyspnea or shortness of breath Dermatological: negative for rash Respiratory: negative for cough or wheezing Urologic: negative for hematuria Abdominal: negative for nausea, vomiting, diarrhea, bright red blood per rectum, melena, or hematemesis Neurologic: negative for visual changes, syncope, or dizziness All other systems reviewed and are otherwise negative except as noted above.    Blood pressure 110/60, pulse 53, height 5\' 6"  (1.676 m), weight 196 lb (88.905 kg).  General appearance: alert, cooperative and no distress Neck: no carotid bruit and no JVD Lungs: clear to auscultation bilaterally Heart: regular rate and rhythm, S1, S2 normal, no murmur, click, rub or gallop Extremities: no LEE Pulses: 2+ and symmetric Skin: warm and dry Neurologic: Grossly normal  EKG NSR with TWI in lead III (unchanged from discharge EKG)  ASSESSMENT AND PLAN:   1. CAD: S/P post inferior STEMI secondary to an occluded RCA, status post PCI plus drug-eluting stenting. He has remained stable without any recurrent angina. Continue dual antiplatelet therapy with aspirin plus Brilinta for a minimum of one year given newly placed drug-eluting stent. Continue beta blocker, high-dose statin and ACE inhibitor for secondary prevention. Will  place referral for cardiac rehabilitation.  2. Hyperlipidemia: Recent fasting lipid panel revealed an elevated LDL at 127 mg/dL. His goal in the setting of recent MI/CAD is less than 70 mg/dL. We will recheck a fasting lipid panel and hepatic function test in 6 weeks. We also discussed lifestyle modification through diet and exercise.  3. HTN: BP is well controlled.    PLAN  F/u with Dr. Percival Spanish in 6 weeks.   Lyda Jester PA-C 05/25/2015 10:33 AM

## 2015-05-25 NOTE — Patient Instructions (Addendum)
Medication Instructions:  Your physician has recommended you make the following change in your medication:    Labwork: Fasting LIPIDS LIVER FUNCTION TEST - WHEN YOU RETURN TO SEE DR. [place].  Testing/Procedures: None ordered  Follow-Up: Your physician recommends that you keep your scheduled appointment with Dr. Percival Spanish 07-13-15 @ 10:45 @ the [place] location.  You have been referred for Cardiac Rehab.  You will be notified from their office to set up an appointment.

## 2015-05-29 ENCOUNTER — Telehealth: Payer: Self-pay | Admitting: Cardiology

## 2015-05-29 NOTE — Telephone Encounter (Signed)
cancelled

## 2015-05-29 NOTE — Telephone Encounter (Signed)
Received faxed referral packet from Derinda Late for upcoming appointment with Dr. Warren Lacy on 07/13/2015.  Records given to [facility].  cbr

## 2015-06-08 ENCOUNTER — Encounter (HOSPITAL_COMMUNITY)
Admission: RE | Admit: 2015-06-08 | Discharge: 2015-06-08 | Disposition: A | Discharge status: Home / Self Care | Payer: Medicare Other | Source: Ambulatory Visit | Attending: Cardiology | Admitting: Cardiology

## 2015-06-08 DIAGNOSIS — Z955 Presence of coronary angioplasty implant and graft: Secondary | ICD-10-CM | POA: Insufficient documentation

## 2015-06-08 DIAGNOSIS — I252 Old myocardial infarction: Secondary | ICD-10-CM | POA: Insufficient documentation

## 2015-06-08 DIAGNOSIS — Z48812 Encounter for surgical aftercare following surgery on the circulatory system: Secondary | ICD-10-CM | POA: Insufficient documentation

## 2015-06-08 NOTE — Progress Notes (Signed)
Cardiac Rehab Medication Review by a Pharmacist  Does the patient  feel that his/her medications are working for him/her?  yes  Has the patient been experiencing any side effects to the medications prescribed?  yes  Does the patient measure his/her own blood pressure or blood glucose at home?  no   Does the patient have any problems obtaining medications due to transportation or finances?   yes  Understanding of regimen: good Understanding of indications: excellent Potential of compliance: excellent   Pharmacist comments: Randy Peterson is a 71yoM who has great understanding of his medication regimen. He has not experienced any side effects since starting his new heart medications.  Dimitri Ped, PharmD. Clinical Pharmacist Resident Pager: [phone] 06/08/2015 8:47 AM

## 2015-06-12 ENCOUNTER — Encounter (HOSPITAL_COMMUNITY)
Admission: RE | Admit: 2015-06-12 | Discharge: 2015-06-12 | Disposition: A | Discharge status: Home / Self Care | Payer: Medicare Other | Source: Ambulatory Visit | Attending: Cardiology | Admitting: Cardiology

## 2015-06-12 DIAGNOSIS — I252 Old myocardial infarction: Secondary | ICD-10-CM | POA: Diagnosis not present

## 2015-06-12 DIAGNOSIS — Z48812 Encounter for surgical aftercare following surgery on the circulatory system: Secondary | ICD-10-CM | POA: Diagnosis present

## 2015-06-12 DIAGNOSIS — Z955 Presence of coronary angioplasty implant and graft: Secondary | ICD-10-CM | POA: Diagnosis not present

## 2015-06-12 NOTE — Progress Notes (Signed)
Pt started exercise at the 11:15 am  cardiac rehab Phase II program.   Pt tolerated light exercise without difficulty. VSS, telemetry- SR rare pac, asymptomatic.  Medication list reconciled.  Pt verbalized compliance with medications and denies barriers to compliance. PSYCHOSOCIAL ASSESSMENT:  PHQ-0. Pt with long history of depression and takes Wellbutrin and Cymbalta.  Pt feels this is working well for him.  Pt exhibits positive coping skills, hopeful outlook with supportive family. No psychosocial needs identified at this time, no psychosocial interventions necessary.  Pt enjoys reading, walking and working in the yard. Pt cardiac rehab  goal is  to gain strength, develop exercise routine and nutrition.  Pt encouraged to participate in home exercise and education classes to increase ability to achieve these goals. Pt did not exercise prior to this cardiac event.  Pt desires to develop a plan for exercise and nutrition.  Pt long term cardiac rehab goal pt would like to continue with the exercise program and "stick with it".  Pt oriented to exercise equipment and routine.  Understanding verbalized. Cherre Huger, BSN

## 2015-06-14 ENCOUNTER — Encounter (HOSPITAL_COMMUNITY)
Admission: RE | Admit: 2015-06-14 | Discharge: 2015-06-14 | Disposition: A | Discharge status: Home / Self Care | Payer: Medicare Other | Source: Ambulatory Visit | Attending: Cardiology | Admitting: Cardiology

## 2015-06-14 ENCOUNTER — Other Ambulatory Visit: Payer: Self-pay | Admitting: *Deleted

## 2015-06-14 DIAGNOSIS — Z48812 Encounter for surgical aftercare following surgery on the circulatory system: Secondary | ICD-10-CM | POA: Diagnosis not present

## 2015-06-14 MED ORDER — AMBULATORY NON FORMULARY MEDICATION: 90.0000 mg | Freq: Every day | Status: DC

## 2015-06-14 MED ORDER — AMBULATORY NON FORMULARY MEDICATION: 81.0000 mg | Freq: Every day | Status: DC

## 2015-06-16 ENCOUNTER — Encounter: Payer: Self-pay | Admitting: *Deleted

## 2015-06-16 ENCOUNTER — Telehealth: Payer: Self-pay | Admitting: *Deleted

## 2015-06-16 ENCOUNTER — Encounter (HOSPITAL_COMMUNITY)
Admission: RE | Admit: 2015-06-16 | Discharge: 2015-06-16 | Disposition: A | Discharge status: Home / Self Care | Payer: Medicare Other | Source: Ambulatory Visit | Attending: Cardiology | Admitting: Cardiology

## 2015-06-16 DIAGNOSIS — Z48812 Encounter for surgical aftercare following surgery on the circulatory system: Secondary | ICD-10-CM | POA: Diagnosis not present

## 2015-06-16 DIAGNOSIS — Z006 Encounter for examination for normal comparison and control in clinical research program: Secondary | ICD-10-CM

## 2015-06-16 NOTE — Progress Notes (Signed)
Left message for pt to call back for TWILIGHT research study telephone follow up.

## 2015-06-16 NOTE — Telephone Encounter (Signed)
Signed order was faxed back [facility] cardiac Rehab Phase II

## 2015-06-16 NOTE — Progress Notes (Signed)
TWILIGHT Research Study 1 month telephone follow up visit completed. Patient states "no problems" denies any adverse events and no bleeding events. Questions encouraged and answered. 3 month TWILIGHT randomization appointment scheduled for Nov 16 after cardiac rehab. (12:45).

## 2015-06-19 ENCOUNTER — Encounter (HOSPITAL_COMMUNITY)
Admission: RE | Admit: 2015-06-19 | Discharge: 2015-06-19 | Disposition: A | Discharge status: Home / Self Care | Payer: Medicare Other | Source: Ambulatory Visit | Attending: Cardiology | Admitting: Cardiology

## 2015-06-19 DIAGNOSIS — Z48812 Encounter for surgical aftercare following surgery on the circulatory system: Secondary | ICD-10-CM | POA: Insufficient documentation

## 2015-06-19 DIAGNOSIS — I252 Old myocardial infarction: Secondary | ICD-10-CM | POA: Diagnosis not present

## 2015-06-19 DIAGNOSIS — Z955 Presence of coronary angioplasty implant and graft: Secondary | ICD-10-CM | POA: Insufficient documentation

## 2015-06-21 ENCOUNTER — Encounter (HOSPITAL_COMMUNITY)
Admission: RE | Admit: 2015-06-21 | Discharge: 2015-06-21 | Disposition: A | Discharge status: Home / Self Care | Payer: Medicare Other | Source: Ambulatory Visit | Attending: Cardiology | Admitting: Cardiology

## 2015-06-21 DIAGNOSIS — Z48812 Encounter for surgical aftercare following surgery on the circulatory system: Secondary | ICD-10-CM | POA: Diagnosis not present

## 2015-06-21 NOTE — Progress Notes (Signed)
Reviewed home exercise guidelines with patient including endpoints, temperature precautions, target heart rate and rate of perceived exertion. Pt is currently walking 15-20 minutes twice per week as his mode of home exercise. Encouraged patient to increase duration to 30 minutes, and pt is amenable to this. Pt voices understanding of instructions given. Randy Passer, MS, ACSM CCEP

## 2015-06-23 ENCOUNTER — Encounter (HOSPITAL_COMMUNITY)
Admission: RE | Admit: 2015-06-23 | Discharge: 2015-06-23 | Disposition: A | Discharge status: Home / Self Care | Payer: Medicare Other | Source: Ambulatory Visit | Attending: Cardiology | Admitting: Cardiology

## 2015-06-23 DIAGNOSIS — Z48812 Encounter for surgical aftercare following surgery on the circulatory system: Secondary | ICD-10-CM | POA: Diagnosis not present

## 2015-06-26 ENCOUNTER — Encounter (HOSPITAL_COMMUNITY)
Admission: RE | Admit: 2015-06-26 | Discharge: 2015-06-26 | Disposition: A | Discharge status: Home / Self Care | Payer: Medicare Other | Source: Ambulatory Visit | Attending: Cardiology | Admitting: Cardiology

## 2015-06-26 DIAGNOSIS — Z48812 Encounter for surgical aftercare following surgery on the circulatory system: Secondary | ICD-10-CM | POA: Diagnosis not present

## 2015-06-28 ENCOUNTER — Encounter (HOSPITAL_COMMUNITY)
Admission: RE | Admit: 2015-06-28 | Discharge: 2015-06-28 | Disposition: A | Discharge status: Home / Self Care | Payer: Medicare Other | Source: Ambulatory Visit | Attending: Cardiology | Admitting: Cardiology

## 2015-06-28 DIAGNOSIS — Z48812 Encounter for surgical aftercare following surgery on the circulatory system: Secondary | ICD-10-CM | POA: Diagnosis not present

## 2015-06-30 ENCOUNTER — Encounter (HOSPITAL_COMMUNITY): Payer: Medicare Other

## 2015-07-03 ENCOUNTER — Encounter (HOSPITAL_COMMUNITY)
Admission: RE | Admit: 2015-07-03 | Discharge: 2015-07-03 | Disposition: A | Discharge status: Home / Self Care | Payer: Medicare Other | Source: Ambulatory Visit | Attending: Cardiology | Admitting: Cardiology

## 2015-07-03 DIAGNOSIS — Z48812 Encounter for surgical aftercare following surgery on the circulatory system: Secondary | ICD-10-CM | POA: Diagnosis not present

## 2015-07-03 NOTE — Progress Notes (Signed)
QUALITY OF LIFE SCORE REVIEW Patient completed Quality of Life Survey as a participant in Cardiac Rehab. Pt scored as follows overall 27.35, health and function 24.00 socioeconomic 30.00, physical and spiritual 30.00 and family 30.00 Scores less than 21 are considered low.  No needs are identified and no intervention is needed. Will continue to monitor and intervene as necessary. Cherre Huger, BSN

## 2015-07-05 ENCOUNTER — Encounter (HOSPITAL_COMMUNITY)
Admission: RE | Admit: 2015-07-05 | Discharge: 2015-07-05 | Disposition: A | Discharge status: Home / Self Care | Payer: Medicare Other | Source: Ambulatory Visit | Attending: Cardiology | Admitting: Cardiology

## 2015-07-05 DIAGNOSIS — Z48812 Encounter for surgical aftercare following surgery on the circulatory system: Secondary | ICD-10-CM | POA: Diagnosis not present

## 2015-07-07 ENCOUNTER — Encounter (HOSPITAL_COMMUNITY)
Admission: RE | Admit: 2015-07-07 | Discharge: 2015-07-07 | Disposition: A | Discharge status: Home / Self Care | Payer: Medicare Other | Source: Ambulatory Visit | Attending: Cardiology | Admitting: Cardiology

## 2015-07-07 DIAGNOSIS — Z48812 Encounter for surgical aftercare following surgery on the circulatory system: Secondary | ICD-10-CM | POA: Diagnosis not present

## 2015-07-10 ENCOUNTER — Encounter (HOSPITAL_COMMUNITY)
Admission: RE | Admit: 2015-07-10 | Discharge: 2015-07-10 | Disposition: A | Discharge status: Home / Self Care | Payer: Medicare Other | Source: Ambulatory Visit | Attending: Cardiology | Admitting: Cardiology

## 2015-07-10 DIAGNOSIS — Z48812 Encounter for surgical aftercare following surgery on the circulatory system: Secondary | ICD-10-CM | POA: Diagnosis not present

## 2015-07-12 ENCOUNTER — Encounter (HOSPITAL_COMMUNITY)
Admission: RE | Admit: 2015-07-12 | Discharge: 2015-07-12 | Disposition: A | Discharge status: Home / Self Care | Payer: Medicare Other | Source: Ambulatory Visit | Attending: Cardiology | Admitting: Cardiology

## 2015-07-12 DIAGNOSIS — Z48812 Encounter for surgical aftercare following surgery on the circulatory system: Secondary | ICD-10-CM | POA: Diagnosis not present

## 2015-07-13 ENCOUNTER — Telehealth: Payer: Self-pay | Admitting: *Deleted

## 2015-07-13 ENCOUNTER — Encounter: Payer: Self-pay | Admitting: *Deleted

## 2015-07-13 ENCOUNTER — Ambulatory Visit (INDEPENDENT_AMBULATORY_CARE_PROVIDER_SITE_OTHER): Payer: Medicare Other | Admitting: Cardiology

## 2015-07-13 VITALS — BP 120/60 | HR 60 | Ht 66.0 in | Wt 191.0 lb

## 2015-07-13 DIAGNOSIS — I25118 Atherosclerotic heart disease of native coronary artery with other forms of angina pectoris: Secondary | ICD-10-CM

## 2015-07-13 MED ORDER — METOPROLOL TARTRATE 25 MG PO TABS: 25.0000 mg | ORAL_TABLET | Freq: Two times a day (BID) | ORAL | Status: DC

## 2015-07-13 NOTE — Progress Notes (Addendum)
Cardiology Office Note   Date:  07/13/2015   ID:  Randy Peterson, DOB [birth year], MRN [id number]  PCP:  Marylene Land, MD  Cardiologist:   Minus Breeding, MD   Chief Complaint  Patient presents with  . Coronary Artery Disease      History of Present Illness: Randy Peterson is a 69 y.o. male who presents for follow-up of inferior myocardial infarction. The patient was treated in August for occlusion RCA with DES stenting. He was enrolled in the [place] research study.  He has participated in cardiac rehabilitation.  The patient denies any new symptoms such as chest discomfort, neck or arm discomfort. There has been no new shortness of breath, PND or orthopnea. There have been no reported palpitations, presyncope or syncope.   Past Medical History  Diagnosis Date  . Depression   . Anxiety   . Prostate enlargement   . Acid reflux   . Angina decubitus ([place]) 05/16/15  . Acute MI, inferior wall, initial episode of care ([place]) 05/17/15  . Hyperlipidemia LDL goal <70   . CAD in native artery     per cath 05/16/14  . S/P coronary artery stent placement, DES promus premier, RCA 05/17/15 05/18/2015    Past Surgical History  Procedure Laterality Date  . None    . Cardiac catheterization N/A 05/17/2015    Procedure: Left Heart Cath and Coronary Angiography;  Surgeon: Sherren Mocha, MD;  Location: [facility];  Service: Cardiovascular;  Laterality: N/A;  . Cardiac catheterization N/A 05/17/2015    Procedure: Coronary Stent Intervention;  Surgeon: Sherren Mocha, MD;  Location: [facility];  Service: Cardiovascular;  Laterality: N/A;     Current Outpatient Prescriptions  Medication Sig Dispense Refill  . acetaminophen (TYLENOL) 325 MG tablet Take 2 tablets (650 mg total) by mouth every 4 (four) hours as needed for headache or mild pain.    Marland Kitchen ALPRAZolam (XANAX) 1 MG tablet Take 1 tablet (1 mg total) by mouth daily as needed for anxiety. 90 tablet 0  . AMBULATORY NON  FORMULARY MEDICATION Take 90 mg by mouth daily. Medication Name: BRILINTA 90 mg BID (TWILIGHT Research Study PROVIDED Do NOT Fill)    . AMBULATORY NON FORMULARY MEDICATION Take 81 mg by mouth daily. Medication Name: ASA 81 mg Daily (TWILIGHT Research Study PROVIDED )    . atorvastatin (LIPITOR) 80 MG tablet Take 1 tablet (80 mg total) by mouth daily at 6 PM. 30 tablet 6  . buPROPion (WELLBUTRIN XL) 300 MG 24 hr tablet Take 1 tablet (300 mg total) by mouth daily. 90 tablet 0  . cholecalciferol (VITAMIN D) 1000 UNITS tablet Take 1,000 Units by mouth daily.    . cycloSPORINE (RESTASIS) 0.05 % ophthalmic emulsion Place 1 drop into both eyes 2 (two) times daily. For dry eyes per patient    . DULoxetine (CYMBALTA) 30 MG capsule TAKE 1 CAPSULE (30 MG TOTAL) BY MOUTH DAILY. 90 capsule 0  . finasteride (PROSCAR) 5 MG tablet Take 5 mg by mouth daily.    . metoprolol tartrate (LOPRESSOR) 25 MG tablet Take 1 tablet (25 mg total) by mouth 2 (two) times daily. 60 tablet 6  . nitroGLYCERIN (NITROSTAT) 0.4 MG SL tablet Place 1 tablet (0.4 mg total) under the tongue every 5 (five) minutes as needed for chest pain (CP or SOB). 25 tablet 4  . pantoprazole (PROTONIX) 40 MG tablet Take 40 mg by mouth daily.    . ranitidine (ZANTAC) 150 MG tablet Take  150 mg by mouth every evening.     . tamsulosin (FLOMAX) 0.4 MG CAPS capsule Take 0.4 mg by mouth daily.      No current facility-administered medications for this visit.    Allergies:   Review of patient's allergies indicates no known allergies.    ROS:  Please see the history of present illness.   Otherwise, review of systems are positive for none.   All other systems are reviewed and negative.    PHYSICAL EXAM: VS:  BP 120/60 mmHg  Pulse 60  Ht 5\' 6"  (1.676 m)  Wt 191 lb (86.637 kg)  BMI 30.84 kg/m2 , BMI Body mass index is 30.84 kg/(m^2). GENERAL:  Well appearing HEENT:  Pupils equal round and reactive, fundi not visualized, oral mucosa unremarkable NECK:   No jugular venous distention, waveform within normal limits, carotid upstroke brisk and symmetric, no bruits, no thyromegaly LYMPHATICS:  No cervical, inguinal adenopathy LUNGS:  Clear to auscultation bilaterally BACK:  No CVA tenderness CHEST:  Unremarkable HEART:  PMI not displaced or sustained,S1 and S2 within normal limits, no S3, no S4, no clicks, no rubs, no murmurs ABD:  Flat, positive bowel sounds normal in frequency in pitch, no bruits, no rebound, no guarding, no midline pulsatile mass, no hepatomegaly, no splenomegaly EXT:  2 plus pulses throughout, no edema, no cyanosis no clubbing SKIN:  No rashes no nodules NEURO:  Cranial nerves II through XII grossly intact, motor grossly intact throughout PSYCH:  Cognitively intact, oriented to person place and time    EKG:  EKG is not ordered today.   Recent Labs: 05/17/2015: B Natriuretic Peptide 129.6*; TSH 1.750 05/18/2015: BUN 14; Creatinine, Ser 1.10; Hemoglobin 13.1; Platelets 230; Potassium 3.8; Sodium 138    Lipid Panel    Component Value Date/Time   CHOL 188 05/17/2015 0600   TRIG 59 05/17/2015 0600   HDL 49 05/17/2015 0600   CHOLHDL 3.8 05/17/2015 0600   VLDL 12 05/17/2015 0600   LDLCALC 127* 05/17/2015 0600      Wt Readings from Last 3 Encounters:  07/13/15 191 lb (86.637 kg)  06/08/15 195 lb 15.8 oz (88.9 kg)  05/25/15 196 lb (88.905 kg)      Other studies Reviewed: Additional studies/ records that were reviewed today include: Hospital records.. Review of the above records demonstrates:  Please see elsewhere in the note.     ASSESSMENT AND PLAN:  CAD:  The patient has no ongoing symptoms. No change in therapy is indicated. We will continue with risk reduction we talked about increasing his activity level at home.  DYSLIPIDEMIA:  He is going to see Marylene Land, MD at the end of this month and will have a lipid profile. I would be happy to review this.    (Of note I did review the letter from  Marylene Land, MD concerning the patient's elevated CK and leg pain in the past on Lipitor.  We will need to watch this closely.)   Current medicines are reviewed at length with the patient today.  The patient does not have concerns regarding medicines.  The following changes have been made:  no change  Labs/ tests ordered today include: None  No orders of the defined types were placed in this encounter.     Disposition:   FU with six months.      Signed, Minus Breeding, MD  07/13/2015 4:33 PM    [place]

## 2015-07-13 NOTE — Telephone Encounter (Signed)
Signed order was fax back to Cardiac Rehab 

## 2015-07-13 NOTE — Patient Instructions (Signed)
Your physician wants you to follow-up in: 6 Months You will receive a reminder letter in the mail two months in advance. If you don't receive a letter, please call our office to schedule the follow-up appointment.  

## 2015-07-14 ENCOUNTER — Encounter (HOSPITAL_COMMUNITY)
Admission: RE | Admit: 2015-07-14 | Discharge: 2015-07-14 | Disposition: A | Discharge status: Home / Self Care | Payer: Medicare Other | Source: Ambulatory Visit | Attending: Cardiology | Admitting: Cardiology

## 2015-07-14 DIAGNOSIS — Z48812 Encounter for surgical aftercare following surgery on the circulatory system: Secondary | ICD-10-CM | POA: Diagnosis not present

## 2015-07-17 ENCOUNTER — Encounter (HOSPITAL_COMMUNITY): Payer: Medicare Other

## 2015-07-19 ENCOUNTER — Encounter (HOSPITAL_COMMUNITY)
Admission: RE | Admit: 2015-07-19 | Discharge: 2015-07-19 | Disposition: A | Discharge status: Home / Self Care | Payer: Medicare Other | Source: Ambulatory Visit | Attending: Cardiology | Admitting: Cardiology

## 2015-07-19 DIAGNOSIS — I252 Old myocardial infarction: Secondary | ICD-10-CM | POA: Diagnosis not present

## 2015-07-19 DIAGNOSIS — Z955 Presence of coronary angioplasty implant and graft: Secondary | ICD-10-CM | POA: Insufficient documentation

## 2015-07-19 DIAGNOSIS — Z48812 Encounter for surgical aftercare following surgery on the circulatory system: Secondary | ICD-10-CM | POA: Diagnosis not present

## 2015-07-19 NOTE — Progress Notes (Signed)
Randy Peterson 69 y.o. male Nutrition Note Spoke with pt.  Nutrition Survey reviewed with pt. Pt is following Step 2 of the Therapeutic Lifestyle Changes diet. Pt wants to lose wt. Pt has been trying to lose wt by decreasing portion sizes and changing diet. Pt wt is down ~3.3 kg (7.3 lb). Rate of wt loss apprears safe. Wt loss tips reviewed. Pt expressed understanding of the information reviewed. Pt aware of nutrition education classes offered. Lab Results  Component Value Date   HGBA1C 5.6 05/18/2015   Wt Readings from Last 3 Encounters:  07/13/15 191 lb (86.637 kg)  06/08/15 195 lb 15.8 oz (88.9 kg)  05/25/15 196 lb (88.905 kg)   Nutrition Diagnosis ? Food-and nutrition-related knowledge deficit related to lack of exposure to information as related to diagnosis of: ? CVD ? Obesity related to excessive energy intake as evidenced by a BMI of 31.4  Nutrition Intervention ? Benefits of adopting Therapeutic Lifestyle Changes discussed when Medficts reviewed. ? Pt to attend the Portion Distortion class ? Pt to attend the  ? Nutrition I class - met; 06/20/15                       ? Nutrition II class - met; 07/11/15 ? Continue client-centered nutrition education by RD, as part of interdisciplinary care.  Goal(s) ? Pt to identify food quantities necessary to achieve: ? wt loss to a goal wt of 172-190 lb (78-86.2 kg) at graduation from cardiac rehab.  ? Pt to describe the benefit of including fruits, vegetables, whole grains, and low-fat dairy products in a heart healthy meal plan.  Monitor and Evaluate progress toward nutrition goal with team.  Derek Mound, M.Ed, RD, LDN, CDE 07/19/2015 12:10 PM

## 2015-07-21 ENCOUNTER — Encounter (HOSPITAL_COMMUNITY): Payer: Medicare Other

## 2015-07-24 ENCOUNTER — Encounter (HOSPITAL_COMMUNITY)
Admission: RE | Admit: 2015-07-24 | Discharge: 2015-07-24 | Disposition: A | Discharge status: Home / Self Care | Payer: Medicare Other | Source: Ambulatory Visit | Attending: Cardiology | Admitting: Cardiology

## 2015-07-24 DIAGNOSIS — Z48812 Encounter for surgical aftercare following surgery on the circulatory system: Secondary | ICD-10-CM | POA: Diagnosis not present

## 2015-07-26 ENCOUNTER — Encounter (HOSPITAL_COMMUNITY)
Admission: RE | Admit: 2015-07-26 | Discharge: 2015-07-26 | Disposition: A | Discharge status: Home / Self Care | Payer: Medicare Other | Source: Ambulatory Visit | Attending: Cardiology | Admitting: Cardiology

## 2015-07-26 DIAGNOSIS — Z48812 Encounter for surgical aftercare following surgery on the circulatory system: Secondary | ICD-10-CM | POA: Diagnosis not present

## 2015-07-28 ENCOUNTER — Encounter (HOSPITAL_COMMUNITY)
Admission: RE | Admit: 2015-07-28 | Discharge: 2015-07-28 | Disposition: A | Discharge status: Home / Self Care | Payer: Medicare Other | Source: Ambulatory Visit | Attending: Cardiology | Admitting: Cardiology

## 2015-07-28 DIAGNOSIS — Z48812 Encounter for surgical aftercare following surgery on the circulatory system: Secondary | ICD-10-CM | POA: Diagnosis not present

## 2015-07-31 ENCOUNTER — Encounter (HOSPITAL_COMMUNITY): Payer: Medicare Other

## 2015-08-02 ENCOUNTER — Encounter (HOSPITAL_COMMUNITY)
Admission: RE | Admit: 2015-08-02 | Discharge: 2015-08-02 | Disposition: A | Discharge status: Home / Self Care | Payer: Medicare Other | Source: Ambulatory Visit | Attending: Cardiology | Admitting: Cardiology

## 2015-08-02 ENCOUNTER — Other Ambulatory Visit: Payer: Self-pay | Admitting: *Deleted

## 2015-08-02 ENCOUNTER — Encounter: Payer: Self-pay | Admitting: *Deleted

## 2015-08-02 DIAGNOSIS — Z006 Encounter for examination for normal comparison and control in clinical research program: Secondary | ICD-10-CM

## 2015-08-02 DIAGNOSIS — Z48812 Encounter for surgical aftercare following surgery on the circulatory system: Secondary | ICD-10-CM | POA: Diagnosis not present

## 2015-08-02 MED ORDER — AMBULATORY NON FORMULARY MEDICATION: 81.0000 mg | Freq: Every day | Status: DC

## 2015-08-02 NOTE — Progress Notes (Addendum)
TWILIGHT 3 month Randomization visit completed. Patient Randomized to ASA 81 mg daily or Placebo today. Patient denies any adverse or bleeding events. Questions encouraged and answered. Dispensed Bottle # [id number]; # [id number]; [id number]. Patient returned bottle [id number] with 61 pills and bottle # [id number] 25 pills states he did not miss any doses.

## 2015-08-04 ENCOUNTER — Encounter (HOSPITAL_COMMUNITY)
Admission: RE | Admit: 2015-08-04 | Discharge: 2015-08-04 | Disposition: A | Discharge status: Home / Self Care | Payer: Medicare Other | Source: Ambulatory Visit | Attending: Cardiology | Admitting: Cardiology

## 2015-08-04 DIAGNOSIS — Z48812 Encounter for surgical aftercare following surgery on the circulatory system: Secondary | ICD-10-CM | POA: Diagnosis not present

## 2015-08-07 ENCOUNTER — Encounter (HOSPITAL_COMMUNITY)
Admission: RE | Admit: 2015-08-07 | Discharge: 2015-08-07 | Disposition: A | Discharge status: Home / Self Care | Payer: Medicare Other | Source: Ambulatory Visit | Attending: Cardiology | Admitting: Cardiology

## 2015-08-07 ENCOUNTER — Ambulatory Visit (HOSPITAL_COMMUNITY): Payer: Self-pay | Admitting: Psychiatry

## 2015-08-07 DIAGNOSIS — Z48812 Encounter for surgical aftercare following surgery on the circulatory system: Secondary | ICD-10-CM | POA: Diagnosis not present

## 2015-08-09 ENCOUNTER — Encounter (HOSPITAL_COMMUNITY)
Admission: RE | Admit: 2015-08-09 | Discharge: 2015-08-09 | Disposition: A | Discharge status: Home / Self Care | Payer: Medicare Other | Source: Ambulatory Visit | Attending: Cardiology | Admitting: Cardiology

## 2015-08-09 DIAGNOSIS — Z48812 Encounter for surgical aftercare following surgery on the circulatory system: Secondary | ICD-10-CM | POA: Diagnosis not present

## 2015-08-14 ENCOUNTER — Encounter (HOSPITAL_COMMUNITY)
Admission: RE | Admit: 2015-08-14 | Discharge: 2015-08-14 | Disposition: A | Discharge status: Home / Self Care | Payer: Medicare Other | Source: Ambulatory Visit | Attending: Cardiology | Admitting: Cardiology

## 2015-08-14 DIAGNOSIS — Z48812 Encounter for surgical aftercare following surgery on the circulatory system: Secondary | ICD-10-CM | POA: Diagnosis not present

## 2015-08-16 ENCOUNTER — Encounter (HOSPITAL_COMMUNITY)
Admission: RE | Admit: 2015-08-16 | Discharge: 2015-08-16 | Disposition: A | Discharge status: Home / Self Care | Payer: Medicare Other | Source: Ambulatory Visit | Attending: Cardiology | Admitting: Cardiology

## 2015-08-16 DIAGNOSIS — Z48812 Encounter for surgical aftercare following surgery on the circulatory system: Secondary | ICD-10-CM | POA: Diagnosis not present

## 2015-08-17 ENCOUNTER — Telehealth (HOSPITAL_COMMUNITY): Payer: Self-pay | Admitting: *Deleted

## 2015-08-17 NOTE — Telephone Encounter (Signed)
-----   Message from Minus Breeding, MD sent at 08/17/2015 12:55 PM EST ----- Regarding: RE: Change in Exercise Prescription OK to change as requested.  I don't think that this response a permanent part of the record.   ----- Message -----    From: Clotilde Dieter    Sent: 08/16/2015   1:41 PM      To: Minus Breeding, MD Subject: Change in Exercise Prescription                Pt is interested in doing high intensity interval training (HIIT) in Cardiac Rehab.  They have been in program for 10 weeks and have been doing great.  We would like to change their exercise prescription to include HIIT.  Their current THR is 77-122 (50-80%) and we would like to increase it to 145 max (95 %) for HIIT.  Their RPE levels for the HIIT would reach up to 16-17 and active rest would be 11-13.  They would start at 2 min of active rest and 30 sec of high intensity and progress as tolerated. If you are agreeable to this change in exercise prescription please let us know.   Thanks so much for your help!  Alberteen Sam, MA, ACSM RCEP

## 2015-08-18 ENCOUNTER — Encounter (HOSPITAL_COMMUNITY)
Admission: RE | Admit: 2015-08-18 | Discharge: 2015-08-18 | Disposition: A | Discharge status: Home / Self Care | Payer: Medicare Other | Source: Ambulatory Visit | Attending: Cardiology | Admitting: Cardiology

## 2015-08-18 DIAGNOSIS — Z48812 Encounter for surgical aftercare following surgery on the circulatory system: Secondary | ICD-10-CM | POA: Insufficient documentation

## 2015-08-18 DIAGNOSIS — I252 Old myocardial infarction: Secondary | ICD-10-CM | POA: Insufficient documentation

## 2015-08-18 DIAGNOSIS — Z955 Presence of coronary angioplasty implant and graft: Secondary | ICD-10-CM | POA: Insufficient documentation

## 2015-08-21 ENCOUNTER — Encounter (HOSPITAL_COMMUNITY)
Admission: RE | Admit: 2015-08-21 | Discharge: 2015-08-21 | Disposition: A | Discharge status: Home / Self Care | Payer: Medicare Other | Source: Ambulatory Visit | Attending: Cardiology | Admitting: Cardiology

## 2015-08-21 DIAGNOSIS — Z48812 Encounter for surgical aftercare following surgery on the circulatory system: Secondary | ICD-10-CM | POA: Diagnosis not present

## 2015-08-23 ENCOUNTER — Encounter (HOSPITAL_COMMUNITY)
Admission: RE | Admit: 2015-08-23 | Discharge: 2015-08-23 | Disposition: A | Discharge status: Home / Self Care | Payer: Medicare Other | Source: Ambulatory Visit | Attending: Cardiology | Admitting: Cardiology

## 2015-08-23 DIAGNOSIS — Z48812 Encounter for surgical aftercare following surgery on the circulatory system: Secondary | ICD-10-CM | POA: Diagnosis not present

## 2015-08-25 ENCOUNTER — Encounter (HOSPITAL_COMMUNITY)
Admission: RE | Admit: 2015-08-25 | Discharge: 2015-08-25 | Disposition: A | Discharge status: Home / Self Care | Payer: Medicare Other | Source: Ambulatory Visit | Attending: Cardiology | Admitting: Cardiology

## 2015-08-25 DIAGNOSIS — Z48812 Encounter for surgical aftercare following surgery on the circulatory system: Secondary | ICD-10-CM | POA: Diagnosis not present

## 2015-08-28 ENCOUNTER — Encounter (HOSPITAL_COMMUNITY)
Admission: RE | Admit: 2015-08-28 | Discharge: 2015-08-28 | Disposition: A | Discharge status: Home / Self Care | Payer: Medicare Other | Source: Ambulatory Visit | Attending: Cardiology | Admitting: Cardiology

## 2015-08-28 DIAGNOSIS — Z48812 Encounter for surgical aftercare following surgery on the circulatory system: Secondary | ICD-10-CM | POA: Diagnosis not present

## 2015-08-30 ENCOUNTER — Encounter (HOSPITAL_COMMUNITY)
Admission: RE | Admit: 2015-08-30 | Discharge: 2015-08-30 | Disposition: A | Discharge status: Home / Self Care | Payer: Medicare Other | Source: Ambulatory Visit | Attending: Cardiology | Admitting: Cardiology

## 2015-08-30 DIAGNOSIS — Z48812 Encounter for surgical aftercare following surgery on the circulatory system: Secondary | ICD-10-CM | POA: Diagnosis not present

## 2015-08-31 ENCOUNTER — Telehealth (HOSPITAL_COMMUNITY): Payer: Self-pay

## 2015-08-31 DIAGNOSIS — F330 Major depressive disorder, recurrent, mild: Secondary | ICD-10-CM

## 2015-08-31 NOTE — Telephone Encounter (Signed)
Medication refill request - fax recevied requesting a refill of patients prescribed Bupropion XL last ordered as a 90 day supply 05/04/15.  Pt. cancelled 08/07/15 due to provider out and rescheduled for 09/21/15.  Appears to need Cymbalta too.

## 2015-09-01 ENCOUNTER — Encounter (HOSPITAL_COMMUNITY)
Admission: RE | Admit: 2015-09-01 | Discharge: 2015-09-01 | Disposition: A | Discharge status: Home / Self Care | Payer: Medicare Other | Source: Ambulatory Visit | Attending: Cardiology | Admitting: Cardiology

## 2015-09-01 DIAGNOSIS — Z48812 Encounter for surgical aftercare following surgery on the circulatory system: Secondary | ICD-10-CM | POA: Diagnosis not present

## 2015-09-01 MED ORDER — DULOXETINE HCL 30 MG PO CPEP: ORAL_CAPSULE | ORAL | Status: DC

## 2015-09-01 MED ORDER — BUPROPION HCL ER (XL) 300 MG PO TB24: 300.0000 mg | ORAL_TABLET | Freq: Every day | ORAL | Status: DC

## 2015-09-01 NOTE — Telephone Encounter (Signed)
Met with Dr. Arfeen who approved new 90 day orders of patient's prescribed Cymbalta and Wellbutrin XL.  Both new 90 day orders e-scribed to patient's [facility] on the corner of [address] after verifying with patient's wife this was the correct pharmacy to send orders.  

## 2015-09-04 ENCOUNTER — Encounter (HOSPITAL_COMMUNITY): Payer: Medicare Other

## 2015-09-06 ENCOUNTER — Encounter (HOSPITAL_COMMUNITY)
Admission: RE | Admit: 2015-09-06 | Discharge: 2015-09-06 | Disposition: A | Discharge status: Home / Self Care | Payer: Medicare Other | Source: Ambulatory Visit | Attending: Cardiology | Admitting: Cardiology

## 2015-09-06 DIAGNOSIS — Z48812 Encounter for surgical aftercare following surgery on the circulatory system: Secondary | ICD-10-CM | POA: Diagnosis not present

## 2015-09-08 ENCOUNTER — Encounter (HOSPITAL_COMMUNITY)
Admission: RE | Admit: 2015-09-08 | Discharge: 2015-09-08 | Disposition: A | Discharge status: Home / Self Care | Payer: Medicare Other | Source: Ambulatory Visit | Attending: Cardiology | Admitting: Cardiology

## 2015-09-08 DIAGNOSIS — Z48812 Encounter for surgical aftercare following surgery on the circulatory system: Secondary | ICD-10-CM | POA: Diagnosis not present

## 2015-09-11 ENCOUNTER — Encounter (HOSPITAL_COMMUNITY): Payer: Medicare Other

## 2015-09-13 ENCOUNTER — Encounter (HOSPITAL_COMMUNITY)
Admission: RE | Admit: 2015-09-13 | Discharge: 2015-09-13 | Disposition: A | Discharge status: Home / Self Care | Payer: Medicare Other | Source: Ambulatory Visit | Attending: Cardiology | Admitting: Cardiology

## 2015-09-13 DIAGNOSIS — Z48812 Encounter for surgical aftercare following surgery on the circulatory system: Secondary | ICD-10-CM | POA: Diagnosis not present

## 2015-09-15 ENCOUNTER — Encounter (HOSPITAL_COMMUNITY)
Admission: RE | Admit: 2015-09-15 | Discharge: 2015-09-15 | Disposition: A | Discharge status: Home / Self Care | Payer: Medicare Other | Source: Ambulatory Visit | Attending: Cardiology | Admitting: Cardiology

## 2015-09-15 DIAGNOSIS — Z48812 Encounter for surgical aftercare following surgery on the circulatory system: Secondary | ICD-10-CM | POA: Diagnosis not present

## 2015-09-15 NOTE — Progress Notes (Signed)
Pt graduated from cardiac rehab program today with completion of 36 exercise sessions in Phase II. Pt maintained good attendance to both exercise and education classes.  Pt made excellent progress during his participation in rehab as evidenced by increased MET level.  Pt increased from 3.8 to 7.1.  Medication list reconciled. Repeat  PHQ score-0  .  Pt has made significant lifestyle changes and should be commended for his success. Pt continues to exhibit positive and healthy outlook on life. He and his wife  have made significant changes in dietary and exercise together.  He feels this one other reason he has been successful in making lasting lifestyle changes  .Pt feels he has achieved his goals during cardiac rehab.  Pt feels he has achieved his goal to get his strength back and get in exercise and diet program.  Pt is fully committed to continue to participate in consistent exercise and healthy eating habits.  Pt admits that this cardiac event was a wake up call to him that changes needed to be made.  Pt long term goal to continue with exercise and diet plan.  Pt plans to exercise with his wife at local gym 3 x week. Pt also has made it a point to become more active in his activities of daily living.  Encouraged pt to increase his exercise sessions to at least 4-5 with the ultimate goal of 6-7 sessions a week. It was a delight to have this patient participate in cardiac rehab. Cherre Huger, BSN

## 2015-09-21 ENCOUNTER — Ambulatory Visit (INDEPENDENT_AMBULATORY_CARE_PROVIDER_SITE_OTHER): Payer: Medicare Other | Admitting: Psychiatry

## 2015-09-21 ENCOUNTER — Encounter: Payer: Self-pay | Admitting: *Deleted

## 2015-09-21 ENCOUNTER — Encounter (HOSPITAL_COMMUNITY): Payer: Self-pay | Admitting: Psychiatry

## 2015-09-21 VITALS — BP 119/73 | HR 67 | Ht 66.0 in | Wt 188.8 lb

## 2015-09-21 DIAGNOSIS — F330 Major depressive disorder, recurrent, mild: Secondary | ICD-10-CM

## 2015-09-21 DIAGNOSIS — Z006 Encounter for examination for normal comparison and control in clinical research program: Secondary | ICD-10-CM

## 2015-09-21 NOTE — Progress Notes (Signed)
[facility] Progress Note  [place] [id number] 69 y.o.  09/21/2015 11:13 AM  Chief Complaint:  Medication management and followup.      History of Present Illness:  Jabali came for his followup appointment.  Patient has a heart attack in August and he has not seen since then.  He is doing much better.  He had a stent Place and he recently finished Card[facility] rehabilitation.  Overall he described his mood is much better.  His energy level is getting better.  He had a good Christmas with his wife and his daughter.  He went to [place] and had a good time.  He denies any irritability, anger, mood swing or any crying spells.  He denies any feeling of hopelessness or worthlessness.  He described his depression is under control.  He is taking Cymbalta and Wellbutrin.  He takes Xanax only as needed and he still has refill remaining.  Patient denies drinking or using any illegal substances.  He has no tremors, shakes or any EPS.  His appetite is okay.  His vitals are stable.  Patient lives with his wife and his daughter lives close by.  Suicidal Ideation: No Plan Formed: No Patient has means to carry out plan: No  Homicidal Ideation: No Plan Formed: No Patient has means to carry out plan: No  Review of Systems: Psychiatric: Agitation: No Hallucination: No Depressed Mood: No Insomnia: No Hypersomnia: No Altered Concentration: No Feels Worthless: No Grandiose Ideas: No Belief In Special Powers: No New/Increased Substance Abuse: No Compulsions: No  Neurologic: Headache: No Seizure: No Paresthesias: No  Past Medical History:  Patient see Dr. Deon Pilling.  He has history of Acid reflux, chronic back pain and enlarged prostate.   Outpatient Encounter Prescriptions as of 09/21/2015  Medication Sig  . acetaminophen (TYLENOL) 325 MG tablet Take 2 tablets (650 mg total) by mouth every 4 (four) hours as needed for headache or mild pain.  Marland Kitchen ALPRAZolam (XANAX) 1 MG tablet  Take 1 tablet (1 mg total) by mouth daily as needed for anxiety.  . AMBULATORY NON FORMULARY MEDICATION Take 90 mg by mouth daily. Medication Name: BRILINTA 90 mg BID (TWILIGHT Research Study PROVIDED Do NOT Fill)  . AMBULATORY NON FORMULARY MEDICATION Take 81 mg by mouth daily. Medication Name: ASA 81 mg or PLACEBO Daily (TWILIGHT Research Study provided)  . atorvastatin (LIPITOR) 80 MG tablet Take 1 tablet (80 mg total) by mouth daily at 6 PM.  . buPROPion (WELLBUTRIN XL) 300 MG 24 hr tablet Take 1 tablet (300 mg total) by mouth daily.  . cholecalciferol (VITAMIN D) 1000 UNITS tablet Take 1,000 Units by mouth daily.  . cycloSPORINE (RESTASIS) 0.05 % ophthalmic emulsion Place 1 drop into both eyes 2 (two) times daily. For dry eyes per patient  . DULoxetine (CYMBALTA) 30 MG capsule TAKE 1 CAPSULE (30 MG TOTAL) BY MOUTH DAILY.  . finasteride (PROSCAR) 5 MG tablet Take 5 mg by mouth daily.  . metoprolol tartrate (LOPRESSOR) 25 MG tablet Take 1 tablet (25 mg total) by mouth 2 (two) times daily.  . nitroGLYCERIN (NITROSTAT) 0.4 MG SL tablet Place 1 tablet (0.4 mg total) under the tongue every 5 (five) minutes as needed for chest pain (CP or SOB).  . pantoprazole (PROTONIX) 40 MG tablet Take 40 mg by mouth daily.  . ranitidine (ZANTAC) 150 MG tablet Take 150 mg by mouth every evening.   . tamsulosin (FLOMAX) 0.4 MG CAPS capsule Take 0.4 mg by mouth daily.  No facility-administered encounter medications on file as of 09/21/2015.    Past Psychiatric History/Hospitalization(s): Patient has history  Inpatient in 2008 due to suicidal attempt and require inpatient services. He cut his wrist.  At that time he had a stress because of his job and finances.  In the past he has given Paxil and Risperdal.  He was also given Vyvanse to help his attention and concentration however it was discontinued because of increased anxiety.  Patient never had psychological testing. Patient does not recall any mania, psychosis  or any hallucination.   Anxiety: Yes Bipolar Disorder: No Depression: Yes Mania: No Psychosis: No Schizophrenia: No Personality Disorder: No Hospitalization for psychiatric illness: Yes History of Electroconvulsive Shock Therapy: No Prior Suicide Attempts: Yes  Physical Exam: Constitutional:  BP 119/73 mmHg  Pulse 67  Ht 5\' 6"  (1.676 m)  Wt 188 lb 12.8 oz (85.639 kg)  BMI 30.49 kg/m2  No results found for this or any previous visit (from the past 2160 hour(s)).\  General Appearance: alert, oriented, no acute distress, well nourished, obese and well groomed and casually dressed  Musculoskeletal: Strength & Muscle Tone: within normal limits Gait & Station: normal Patient leans: N/A  Mental Status Examination; Patient is casually dressed and fairly groomed.  He is calm and cooperative.  His speech is clear and coherent.  His thought process is logical and goal directed.  He denies any auditory or visual hallucination.  He denies any active or passive suicidal thoughts or homicidal thought.  There were no delusions or any paranoia present.  There were no flight of ideas or any loose association.  His fund of knowledge is good.  He has no tremors or shakes.  His attention and concentration is good.  His immediate and recent memory is intact.  He is alert and oriented x3.  His psychomotor activity is normal.  His insight judgment and impulse control is okay.  Established Problem, Stable/Improving (1), Review of Last Therapy Session (1) and Review of Medication Regimen & Side Effects (2)  Assessment: Axis I: Maj. depressive disorder, recurrent, mild;   Axis II: Deferred  Axis III: Coronary artery disease, Acid reflux, enlarged prostate, chronic back pain   Plan:  Patient is doing better on his current psychotropic medication.  He had a cardiac event in August any handle that incident very well.  He does not want to change his current psychiatric medication.  He has no side  effects.  I will continue Wellbutrin XL 300 mg daily and Cymbalta 30 mg daily and Xanax 1 mg half to one tablet as needed for insomnia and anxiety.  Patient does not ask for early refills of Xanax.  He is still has refill remaining on his Xanax.  Discussed medication side effects and benefits. Recommended to call us back if he has any question or any concern.  Followup in 2 months.    Cicely Ortner T., MD 09/21/2015

## 2015-09-21 NOTE — Progress Notes (Signed)
TWILIGHT Research 4 month telephone follow-up completed.Patient denies any adverse or bleeding events. States compliant with brilinta and has only taken ASA/placebo provided by study. Questions encouraged and answered. Next study visit due end of April.

## 2015-11-24 ENCOUNTER — Ambulatory Visit (HOSPITAL_COMMUNITY): Payer: Self-pay | Admitting: Psychiatry

## 2015-12-14 ENCOUNTER — Ambulatory Visit (INDEPENDENT_AMBULATORY_CARE_PROVIDER_SITE_OTHER): Payer: Medicare Other | Admitting: Psychiatry

## 2015-12-14 ENCOUNTER — Encounter (HOSPITAL_COMMUNITY): Payer: Self-pay | Admitting: Psychiatry

## 2015-12-14 VITALS — BP 92/60 | HR 99 | Ht 66.0 in | Wt 184.4 lb

## 2015-12-14 DIAGNOSIS — F330 Major depressive disorder, recurrent, mild: Secondary | ICD-10-CM | POA: Diagnosis not present

## 2015-12-14 MED ORDER — BUPROPION HCL ER (XL) 300 MG PO TB24: 300.0000 mg | ORAL_TABLET | Freq: Every day | ORAL | Status: DC

## 2015-12-14 NOTE — Progress Notes (Signed)
[place] [phone] Progress Note  Randy Peterson [id number] 70 y.o.  12/14/2015 3:31 PM  Chief Complaint:  I don't take Xanax anymore.  My anxiety is better.  I like to come off from Cymbalta .      History of Present Illness:  Randy Peterson came for his followup appointment.  He is doing much better on Wellbutrin and Cymbalta.  She denies any side effects.  He has not taken Xanax in recent months.  He denies any panic attack or any anxiety attack.  He sleeping good denies any feeling of hopelessness or worthlessness.  His energy level is good.  He is wondering if he can come off from Cymbalta and discontinue on Wellbutrin.  She denies any paranoia or any hallucination.  His appetite is okay.  His vitals are stable.  His energy level is good.  He has no tremors, shakes, EPS.  Patient lives with his wife and his daughter live close by.  Patient denies drinking or using any illegal substances.  Suicidal Ideation: No Plan Formed: No Patient has means to carry out plan: No  Homicidal Ideation: No Plan Formed: No Patient has means to carry out plan: No  Review of Systems: Psychiatric: Agitation: No Hallucination: No Depressed Mood: No Insomnia: No Hypersomnia: No Altered Concentration: No Feels Worthless: No Grandiose Ideas: No Belief In Special Powers: No New/Increased Substance Abuse: No Compulsions: No  Neurologic: Headache: No Seizure: No Paresthesias: No  Past Medical History:  Patient see Dr. Deon Peterson.  He has history of Acid reflux, chronic back pain and enlarged prostate.   Outpatient Encounter Prescriptions as of 12/14/2015  Medication Sig  . ALPRAZolam (XANAX) 1 MG tablet Take 1 tablet (1 mg total) by mouth daily as needed for anxiety. (Patient taking differently: Take 0.5 mg by mouth daily as needed for anxiety. )  . acetaminophen (TYLENOL) 325 MG tablet Take 2 tablets (650 mg total) by mouth every 4 (four) hours as needed for headache or mild pain.  Marland Kitchen  AMBULATORY NON FORMULARY MEDICATION Take 90 mg by mouth daily. Medication Name: BRILINTA 90 mg BID (TWILIGHT Research Study PROVIDED Do NOT Fill)  . AMBULATORY NON FORMULARY MEDICATION Take 81 mg by mouth daily. Medication Name: ASA 81 mg or PLACEBO Daily (TWILIGHT Research Study provided)  . atorvastatin (LIPITOR) 80 MG tablet Take 1 tablet (80 mg total) by mouth daily at 6 PM.  . buPROPion (WELLBUTRIN XL) 300 MG 24 hr tablet Take 1 tablet (300 mg total) by mouth daily.  . cholecalciferol (VITAMIN D) 1000 UNITS tablet Take 1,000 Units by mouth daily.  . cycloSPORINE (RESTASIS) 0.05 % ophthalmic emulsion Place 1 drop into both eyes 2 (two) times daily. For dry eyes per patient  . finasteride (PROSCAR) 5 MG tablet Take 5 mg by mouth daily.  . metoprolol tartrate (LOPRESSOR) 25 MG tablet Take 1 tablet (25 mg total) by mouth 2 (two) times daily.  . nitroGLYCERIN (NITROSTAT) 0.4 MG SL tablet Place 1 tablet (0.4 mg total) under the tongue every 5 (five) minutes as needed for chest pain (CP or SOB).  . pantoprazole (PROTONIX) 40 MG tablet Take 40 mg by mouth daily.  . ranitidine (ZANTAC) 150 MG tablet Take 150 mg by mouth every evening.   . tamsulosin (FLOMAX) 0.4 MG CAPS capsule Take 0.4 mg by mouth daily.   . [DISCONTINUED] buPROPion (WELLBUTRIN XL) 300 MG 24 hr tablet Take 1 tablet (300 mg total) by mouth daily.  . [DISCONTINUED] DULoxetine (CYMBALTA) 30 MG  capsule TAKE 1 CAPSULE (30 MG TOTAL) BY MOUTH DAILY.   No facility-administered encounter medications on file as of 12/14/2015.    Past Psychiatric History/Hospitalization(s): Patient has history  Inpatient in 2008 due to suicidal attempt and require inpatient services. He cut his wrist.  At that time he had a stress because of his job and finances.  In the past he has given Paxil and Risperdal.  He was also given Vyvanse to help his attention and concentration however it was discontinued because of increased anxiety.  Patient never had  psychological testing. Patient does not recall any mania, psychosis or any hallucination.   Anxiety: Yes Bipolar Disorder: No Depression: Yes Mania: No Psychosis: No Schizophrenia: No Personality Disorder: No Hospitalization for psychiatric illness: Yes History of Electroconvulsive Shock Therapy: No Prior Suicide Attempts: Yes  Physical Exam: Constitutional:  BP 92/60 mmHg  Pulse 99  Ht 5\' 6"  (1.676 m)  Wt 184 lb 6.4 oz (83.643 kg)  BMI 29.78 kg/m2  No results found for this or any previous visit (from the past 2160 hour(s)).\  General Appearance: alert, oriented, no acute distress, well nourished, obese and well groomed and casually dressed  Musculoskeletal: Strength & Muscle Tone: within normal limits Gait & Station: normal Patient leans: N/A  Mental Status Examination; Patient is casually dressed and fairly groomed.  He is calm and cooperative.  His speech is clear and coherent.  His thought process is logical and goal directed.  He denies any auditory or visual hallucination.  He denies any active or passive suicidal thoughts or homicidal thought.  There were no delusions or any paranoia present.  There were no flight of ideas or any loose association.  His fund of knowledge is good.  He has no tremors or shakes.  His attention and concentration is good.  His immediate and recent memory is intact.  He is alert and oriented x3.  His psychomotor activity is normal.  His insight judgment and impulse control is okay.  Established Problem, Stable/Improving (1), Review of Last Therapy Session (1), Review of Medication Regimen & Side Effects (2) and Review of New Medication or Change in Dosage (2)  Assessment: Axis I: Maj. depressive disorder, recurrent, mild;   Axis II: Deferred  Axis III: Coronary artery disease, Acid reflux, enlarged prostate, chronic back pain   Plan:  Patient is doing better on his current psychotropic medication.  He had not filled Xanax in past 8 months  .  He like to come off from Cymbalta but like to continue Wellbutrin XL 300 mg.  We discussed withdrawals from Cymbalta in detail.  Recommended to take Cymbalta every other day for at least 2 weeks to avoid withdrawals.  However he feel that his symptoms of depression and anxiety is coming back that he should go back on Cymbalta immediately.  Recommended to call us back if he has any question or any concern.  Follow-up in 3 months.  I will continue Wellbutrin XL 300 mg daily.  ARFEEN,SYED T., MD 12/14/2015

## 2016-01-11 ENCOUNTER — Encounter: Payer: Self-pay | Admitting: Cardiology

## 2016-01-11 ENCOUNTER — Ambulatory Visit (INDEPENDENT_AMBULATORY_CARE_PROVIDER_SITE_OTHER): Payer: Medicare Other | Admitting: Cardiology

## 2016-01-11 VITALS — BP 104/58 | HR 74 | Ht 67.0 in | Wt 186.2 lb

## 2016-01-11 DIAGNOSIS — I2510 Atherosclerotic heart disease of native coronary artery without angina pectoris: Secondary | ICD-10-CM

## 2016-01-11 NOTE — Patient Instructions (Signed)
Your physician wants you to follow-up in: 1 year or sooner if needed. You will receive a reminder letter in the mail two months in advance. If you don't receive a letter, please call our office to schedule the follow-up appointment.  If you need a refill on your cardiac medications before your next appointment, please call your pharmacy.  No changes were made today in your therapy.  

## 2016-01-11 NOTE — Progress Notes (Signed)
Cardiology Office Note   Date:  01/11/2016   ID:  Randy Peterson, DOB 1946/06/21, MRN [id number]  PCP:  Marylene Land, MD  Cardiologist:   Minus Breeding, MD   No chief complaint on file.     History of Present Illness: Randy Peterson is a 70 y.o. male who presents for follow-up of inferior myocardial infarction. The patient was treated in August for occlusion RCA with DES stenting. He was enrolled in the [place] research study.  He has participated in cardiac rehabilitation.  The patient denies any new symptoms such as chest discomfort, neck or arm discomfort. There has been no new shortness of breath, PND or orthopnea. There have been no reported palpitations, presyncope or syncope.  He has no new complaints since I saw him.   Past Medical History  Diagnosis Date  . Depression   . Anxiety   . Prostate enlargement   . Acid reflux   . Angina decubitus ([place]) 05/16/15  . Acute MI, inferior wall, initial episode of care ([place]) 05/17/15  . Hyperlipidemia LDL goal <70   . CAD in native artery     per cath 05/16/14  . S/P coronary artery stent placement, DES promus premier, RCA 05/17/15 05/18/2015    Past Surgical History  Procedure Laterality Date  . None    . Cardiac catheterization N/A 05/17/2015    Procedure: Left Heart Cath and Coronary Angiography;  Surgeon: Sherren Mocha, MD;  Location: [facility];  Service: Cardiovascular;  Laterality: N/A;  . Cardiac catheterization N/A 05/17/2015    Procedure: Coronary Stent Intervention;  Surgeon: Sherren Mocha, MD;  Location: [facility];  Service: Cardiovascular;  Laterality: N/A;     Current Outpatient Prescriptions  Medication Sig Dispense Refill  . acetaminophen (TYLENOL) 325 MG tablet Take 2 tablets (650 mg total) by mouth every 4 (four) hours as needed for headache or mild pain.    Marland Kitchen ALPRAZolam (XANAX) 1 MG tablet Take 1 tablet (1 mg total) by mouth daily as needed for anxiety. (Patient taking differently: Take  0.5 mg by mouth daily as needed for anxiety. ) 90 tablet 0  . AMBULATORY NON FORMULARY MEDICATION Take 90 mg by mouth daily. Medication Name: BRILINTA 90 mg BID (TWILIGHT Research Study PROVIDED Do NOT Fill)    . AMBULATORY NON FORMULARY MEDICATION Take 81 mg by mouth daily. Medication Name: ASA 81 mg or PLACEBO Daily (TWILIGHT Research Study provided)    . atorvastatin (LIPITOR) 80 MG tablet Take 1 tablet (80 mg total) by mouth daily at 6 PM. 30 tablet 6  . buPROPion (WELLBUTRIN XL) 300 MG 24 hr tablet Take 1 tablet (300 mg total) by mouth daily. 90 tablet 0  . cholecalciferol (VITAMIN D) 1000 UNITS tablet Take 1,000 Units by mouth daily.    . cycloSPORINE (RESTASIS) 0.05 % ophthalmic emulsion Place 1 drop into both eyes 2 (two) times daily. For dry eyes per patient    . finasteride (PROSCAR) 5 MG tablet Take 5 mg by mouth daily.    . metoprolol tartrate (LOPRESSOR) 25 MG tablet Take 1 tablet (25 mg total) by mouth 2 (two) times daily. 180 tablet 3  . nitroGLYCERIN (NITROSTAT) 0.4 MG SL tablet Place 1 tablet (0.4 mg total) under the tongue every 5 (five) minutes as needed for chest pain (CP or SOB). 25 tablet 4  . pantoprazole (PROTONIX) 40 MG tablet Take 40 mg by mouth daily.    . ranitidine (ZANTAC) 150 MG tablet Take 150  mg by mouth every evening.     . tamsulosin (FLOMAX) 0.4 MG CAPS capsule Take 0.4 mg by mouth daily.      No current facility-administered medications for this visit.    Allergies:   Review of patient's allergies indicates no known allergies.    ROS:  Please see the history of present illness.   Otherwise, review of systems are positive for none.   All other systems are reviewed and negative.    PHYSICAL EXAM: VS:  BP 104/58 mmHg  Pulse 74  Ht 5\' 7"  (1.702 m)  Wt 186 lb 4 oz (84.482 kg)  BMI 29.16 kg/m2 , BMI Body mass index is 29.16 kg/(m^2). GENERAL:  Well appearing HEENT:  Pupils equal round and reactive, fundi not visualized, oral mucosa unremarkable NECK:  No  jugular venous distention, waveform within normal limits, carotid upstroke brisk and symmetric, no bruits, no thyromegaly LUNGS:  Clear to auscultation bilaterally BACK:  No CVA tenderness CHEST:  Unremarkable HEART:  PMI not displaced or sustained,S1 and S2 within normal limits, no S3, no S4, no clicks, no rubs, no murmurs ABD:  Flat, positive bowel sounds normal in frequency in pitch, no bruits, no rebound, no guarding, no midline pulsatile mass, no hepatomegaly, no splenomegaly EXT:  2 plus pulses throughout, no edema, no cyanosis no clubbing   EKG:  EKG is not ordered today.   Recent Labs: 05/17/2015: B Natriuretic Peptide 129.6*; TSH 1.750 05/18/2015: BUN 14; Creatinine, Ser 1.10; Hemoglobin 13.1; Platelets 230; Potassium 3.8; Sodium 138    Lipid Panel    Component Value Date/Time   CHOL 188 05/17/2015 0600   TRIG 59 05/17/2015 0600   HDL 49 05/17/2015 0600   CHOLHDL 3.8 05/17/2015 0600   VLDL 12 05/17/2015 0600   LDLCALC 127* 05/17/2015 0600      Wt Readings from Last 3 Encounters:  01/11/16 186 lb 4 oz (84.482 kg)  12/14/15 184 lb 6.4 oz (83.643 kg)  09/21/15 188 lb 12.8 oz (85.639 kg)      Other studies Reviewed: Additional studies/ records that were reviewed today include: Hospital records.. Review of the above records demonstrates:  Please see elsewhere in the note.     ASSESSMENT AND PLAN:  CAD:  The patient has no ongoing symptoms. No change in therapy is indicated. We will continue with risk reduction we talked about increasing his activity level at home.  Of note he's either on aspirin or aspirin plus ticagrelor for his research protocol.  DYSLIPIDEMIA:  This is followed by  Marylene Land, MD . The patient understands the ideal LDL would be less than 70.   Current medicines are reviewed at length with the patient today.  The patient does not have concerns regarding medicines.  The following changes have been made:  no change  Labs/ tests ordered today  include: None  No orders of the defined types were placed in this encounter.     Disposition:   FU with 12 months.      Signed, Minus Breeding, MD  01/11/2016 3:08 PM    [facility]

## 2016-02-20 ENCOUNTER — Encounter: Payer: Self-pay | Admitting: *Deleted

## 2016-02-20 DIAGNOSIS — Z006 Encounter for examination for normal comparison and control in clinical research program: Secondary | ICD-10-CM

## 2016-02-20 NOTE — Progress Notes (Signed)
TWILIGHT Research Study 9 month follow up visit completed. Patient denies any bleeding or other adverse events. Patient returned study drug and Brilinta for pill count and he has been 99.5 compliant with ASA/Placebo and 100% compliant with Brilinta. Next research visit window is 07/10/16-09/08/16 and this will be the end of the study. Further antiplatelet therapy will be at the discretion of the cardiologist at that point. Questions encouraged and answered.

## 2016-02-26 ENCOUNTER — Other Ambulatory Visit: Payer: Self-pay | Admitting: Family Medicine

## 2016-02-26 ENCOUNTER — Ambulatory Visit
Admission: RE | Admit: 2016-02-26 | Discharge: 2016-02-26 | Disposition: A | Discharge status: Home / Self Care | Payer: Medicare Other | Source: Ambulatory Visit | Attending: Family Medicine | Admitting: Family Medicine

## 2016-02-26 DIAGNOSIS — M25511 Pain in right shoulder: Secondary | ICD-10-CM

## 2016-03-14 ENCOUNTER — Other Ambulatory Visit: Payer: Self-pay | Admitting: Cardiology

## 2016-03-14 NOTE — Telephone Encounter (Signed)
Rx(s) sent to pharmacy electronically.  

## 2016-04-02 ENCOUNTER — Ambulatory Visit (HOSPITAL_COMMUNITY): Payer: Self-pay | Admitting: Psychiatry

## 2016-04-05 ENCOUNTER — Other Ambulatory Visit (HOSPITAL_COMMUNITY): Payer: Self-pay | Admitting: Psychiatry

## 2016-04-08 ENCOUNTER — Other Ambulatory Visit (HOSPITAL_COMMUNITY): Payer: Self-pay

## 2016-04-08 DIAGNOSIS — F330 Major depressive disorder, recurrent, mild: Secondary | ICD-10-CM

## 2016-04-08 MED ORDER — BUPROPION HCL ER (XL) 300 MG PO TB24: 300.0000 mg | ORAL_TABLET | Freq: Every day | ORAL | 0 refills | Status: DC

## 2016-04-29 ENCOUNTER — Ambulatory Visit (HOSPITAL_COMMUNITY): Payer: Self-pay | Admitting: Psychiatry

## 2016-06-20 ENCOUNTER — Ambulatory Visit (HOSPITAL_COMMUNITY): Payer: Self-pay | Admitting: Psychiatry

## 2016-07-01 ENCOUNTER — Other Ambulatory Visit (HOSPITAL_COMMUNITY): Payer: Self-pay | Admitting: Psychiatry

## 2016-07-01 DIAGNOSIS — F330 Major depressive disorder, recurrent, mild: Secondary | ICD-10-CM

## 2016-07-02 NOTE — Telephone Encounter (Signed)
Met with Dr. Lovena Le, covering for Dr. Adele Schilder out this date who approved a one time new 90 day order for patient's Wellbutrin XL 300 mg tablet, one a day, #90 with notation evaluation required for further refills as patient last evaluated 12/14/15 but has had appointments cancelled and rescheduled 3 times since due to provider being out.  New one time 90 day order e-scribed to patient's [facility] in [place] on [address] as authorized by Dr. Lovena Le.

## 2016-07-04 ENCOUNTER — Other Ambulatory Visit: Payer: Self-pay | Admitting: *Deleted

## 2016-07-04 MED ORDER — METOPROLOL TARTRATE 25 MG PO TABS: 25.0000 mg | ORAL_TABLET | Freq: Two times a day (BID) | ORAL | 1 refills | Status: DC

## 2016-07-22 ENCOUNTER — Ambulatory Visit (INDEPENDENT_AMBULATORY_CARE_PROVIDER_SITE_OTHER): Payer: Managed Care, Other (non HMO) | Admitting: Orthopedic Surgery

## 2016-07-29 ENCOUNTER — Encounter (INDEPENDENT_AMBULATORY_CARE_PROVIDER_SITE_OTHER): Payer: Self-pay | Admitting: Orthopedic Surgery

## 2016-07-29 ENCOUNTER — Ambulatory Visit (INDEPENDENT_AMBULATORY_CARE_PROVIDER_SITE_OTHER): Payer: Medicare Other | Admitting: Orthopedic Surgery

## 2016-07-29 VITALS — Ht 67.0 in | Wt 186.0 lb

## 2016-07-29 DIAGNOSIS — M7501 Adhesive capsulitis of right shoulder: Secondary | ICD-10-CM | POA: Diagnosis not present

## 2016-07-29 DIAGNOSIS — M7541 Impingement syndrome of right shoulder: Secondary | ICD-10-CM

## 2016-07-29 MED ORDER — LIDOCAINE HCL 1 % IJ SOLN
5.0000 mL | INTRAMUSCULAR | Status: AC | PRN
  Administered 2016-07-29: 5 mL

## 2016-07-29 MED ORDER — METHYLPREDNISOLONE ACETATE 40 MG/ML IJ SUSP
40.0000 mg | INTRAMUSCULAR | Status: AC | PRN
  Administered 2016-07-29: 40 mg via INTRA_ARTICULAR

## 2016-07-29 NOTE — Progress Notes (Signed)
Office Visit Note   Patient: Randy Peterson           Date of Birth: 1945-11-10           MRN: [id number] Visit Date: 07/29/2016              Requested by: Derinda Late, MD [address] [place], [place] [address] PCP: Marylene Land, MD   Assessment & Plan: Visit Diagnoses:  1. Impingement syndrome of right shoulder   2. Adhesive capsulitis of right shoulder     Plan: Right shoulder injected from the posterior portal into the subacromial space patient tolerated this well and will follow up in 4 weeks. Discussed that if most of his symptoms are coming from impingement symptoms the shot should relieve most of his symptoms. Discussed that he has more intra-articular symptoms in this shot will not provide much relief this is both diagnostic and therapeutic. Patient does have adhesive capsulitis as well.  Follow-Up Instructions: Return in about 4 weeks (around 08/26/2016).   Orders:  Orders Placed This Encounter  Procedures  . Large Joint Injection/Arthrocentesis   No orders of the defined types were placed in this encounter.     Procedures: Large Joint Inj Date/Time: 07/29/2016 1:24 PM Performed by: Ranya Fiddler V Authorized by: Newt Minion   Consent Given by:  Patient Site marked: the procedure site was marked   Timeout: prior to procedure the correct patient, procedure, and site was verified   Indications:  Pain and diagnostic evaluation Location:  Shoulder Site:  R subacromial bursa Prep: patient was prepped and draped in usual sterile fashion   Needle Size:  22 G Needle Length:  1.5 inches Ultrasound Guidance: No   Fluoroscopic Guidance: No   Arthrogram: No   Medications:  5 mL lidocaine 1 %; 40 mg methylPREDNISolone acetate 40 MG/ML Aspiration Attempted: No   Patient tolerance:  Patient tolerated the procedure well with no immediate complications     Clinical Data: No additional findings.   Subjective: Chief Complaint  Patient presents with    . Right Shoulder - Pain    Since July no injury    Right shoulder pain since July had cortisone injection with PCP and this ws of no help. He c/o limited movement above head and decreased sleep secondary to the pain. States that he has had x rays with his PCP and they were sent for review.     Review of Systems   Objective: Vital Signs: Ht 5\' 7"  (1.702 m)   Wt 186 lb (84.4 kg)   BMI 29.13 kg/m   Physical Exam patient is alert oriented no adenopathy well-dressed normal affect normal respiratory effort he has a normal gait. Examination he has decreased abduction flexion to only about 120 on the right. He has a negative empty can test no labral symptoms. He has external rotation of only 0 internal rotation of 45 he does have adhesive capsulitis glenohumeral motion up to 90. He has pain with Neer and Hawkins impingement test the biceps tendon is nontender to palpation the before meals joint is nontender to palpation. His outside radiographs were reviewed which showed some mild glenohumeral arthritic changes.  Ortho Exam  Specialty Comments:  No specialty comments available.  Imaging: No results found.   PMFS History: Patient Active Problem List   Diagnosis Date Noted  . Angina decubitus ([place]) 05/18/2015  . CAD in native artery 05/18/2015  . Acute MI, inferior wall, initial episode of care ([place]) 05/18/2015  .  S/P coronary artery stent placement, DES promus premier, RCA 05/17/15 05/18/2015  . NSTEMI (non-ST elevated myocardial infarction) ([place]) 05/16/2015  . Generalized anxiety disorder 02/04/2012  . Major depressive disorder, recurrent episode, mild ([place]) 02/04/2012  . Major depressive disorder, recurrent episode, severe, without mention of psychotic behavior 12/12/2011  . DYSLIPIDEMIA 08/07/2009  . OBESITY, UNSPECIFIED 08/07/2009  . Chest pain 08/07/2009   Past Medical History:  Diagnosis Date  . Acid reflux   . Acute MI, inferior wall, initial episode of care ([place])  05/17/15  . Angina decubitus ([place]) 05/16/15  . Anxiety   . CAD in native artery    per cath 05/16/14  . Depression   . Hyperlipidemia LDL goal <70   . Prostate enlargement   . S/P coronary artery stent placement, DES promus premier, RCA 05/17/15 05/18/2015    Family History  Problem Relation Age of Onset  . Dementia Mother   . Suicidality Brother   . Depression Brother   . Heart attack Brother 17  . Heart attack Father 69  . Stroke Mother   . Hypertension Neg Hx     Past Surgical History:  Procedure Laterality Date  . CARDIAC CATHETERIZATION N/A 05/17/2015   Procedure: Left Heart Cath and Coronary Angiography;  Surgeon: Sherren Mocha, MD;  Location: [facility];  Service: Cardiovascular;  Laterality: N/A;  . CARDIAC CATHETERIZATION N/A 05/17/2015   Procedure: Coronary Stent Intervention;  Surgeon: Sherren Mocha, MD;  Location: [facility];  Service: Cardiovascular;  Laterality: N/A;  . None     Social History   Occupational History  . RETIRED     [employer]   Social History Main Topics  . Smoking status: Former Smoker    Packs/day: 0.25    Years: 40.00    Types: Cigarettes  . Smokeless tobacco: Not on file     Comment: Quit 20 years ago  . Alcohol use 1.2 oz/week    2 Cans of beer per week  . Drug use: No  . Sexual activity: Yes    Birth control/ protection: None

## 2016-08-01 ENCOUNTER — Other Ambulatory Visit (HOSPITAL_COMMUNITY): Payer: Self-pay

## 2016-08-01 DIAGNOSIS — F411 Generalized anxiety disorder: Secondary | ICD-10-CM

## 2016-08-01 MED ORDER — ALPRAZOLAM 1 MG PO TABS: 1.0000 mg | ORAL_TABLET | Freq: Every day | ORAL | 0 refills | Status: DC | PRN

## 2016-08-26 ENCOUNTER — Ambulatory Visit (INDEPENDENT_AMBULATORY_CARE_PROVIDER_SITE_OTHER): Payer: Medicare Other | Admitting: Family

## 2016-08-26 ENCOUNTER — Encounter (INDEPENDENT_AMBULATORY_CARE_PROVIDER_SITE_OTHER): Payer: Self-pay | Admitting: Orthopedic Surgery

## 2016-08-26 DIAGNOSIS — M7501 Adhesive capsulitis of right shoulder: Secondary | ICD-10-CM

## 2016-08-26 NOTE — Progress Notes (Signed)
Office Visit Note   Patient: Randy Peterson           Date of Birth: 21-May-1946           MRN: [id number] Visit Date: 08/26/2016              Requested by: Derinda Late, MD [address] [place], [place] [address] PCP: Marylene Land, MD   Assessment & Plan: Visit Diagnoses: No diagnosis found.  Plan: We'll get him set up for an MRI. Pending results may need to plan for arthroscopic debridement right shoulder. We'll follow-up pending MRI results.  Follow-Up Instructions: No Follow-up on file.   Orders:  No orders of the defined types were placed in this encounter.  No orders of the defined types were placed in this encounter.     Procedures: No procedures performed   Clinical Data: No additional findings.   Subjective: No chief complaint on file.   Patient is a 70 year old gentleman who presents for follow up right shoulder pain and loss of range of motion. Is s/p injection on 07/29/16. He has had no relief from steroid injection. He feels like he will most likely need surgical intervention.    Review of Systems  Constitutional: Negative for chills and fever.     Objective: Vital Signs: There were no vitals taken for this visit.  Physical Exam  Constitutional: He is oriented to person, place, and time. He appears well-developed and well-nourished.  Pulmonary/Chest: Effort normal.  Neurological: He is alert and oriented to person, place, and time.  Psychiatric: He has a normal mood and affect.  Nursing note reviewed.   Right Shoulder Exam   Tenderness  The patient is experiencing no tenderness.    Muscle Strength  The patient has normal right shoulder strength.  Tests  Hawkin's test: positive Impingement: positive  Other  Pulse: present  Comments:  Has decreased abduction, flexion to only about 120 on the right. He has a negative empty can test. no labral symptoms. He has external rotation of only 0 internal rotation of 45. He does  have adhesive capsulitis, glenohumeral motion up to 90.       Specialty Comments:  No specialty comments available.  Imaging: No results found.   PMFS History: Patient Active Problem List   Diagnosis Date Noted  . Angina decubitus ([place]) 05/18/2015  . CAD in native artery 05/18/2015  . Acute MI, inferior wall, initial episode of care ([place]) 05/18/2015  . S/P coronary artery stent placement, DES promus premier, RCA 05/17/15 05/18/2015  . NSTEMI (non-ST elevated myocardial infarction) ([facility]) 05/16/2015  . Generalized anxiety disorder 02/04/2012  . Major depressive disorder, recurrent episode, mild ([place]) 02/04/2012  . Major depressive disorder, recurrent episode, severe, without mention of psychotic behavior 12/12/2011  . DYSLIPIDEMIA 08/07/2009  . OBESITY, UNSPECIFIED 08/07/2009  . Chest pain 08/07/2009   Past Medical History:  Diagnosis Date  . Acid reflux   . Acute MI, inferior wall, initial episode of care ([place]) 05/17/15  . Angina decubitus ([place]) 05/16/15  . Anxiety   . CAD in native artery    per cath 05/16/14  . Depression   . Hyperlipidemia LDL goal <70   . Prostate enlargement   . S/P coronary artery stent placement, DES promus premier, RCA 05/17/15 05/18/2015    Family History  Problem Relation Age of Onset  . Dementia Mother   . Suicidality Brother   . Depression Brother   . Heart attack Brother 64  .  Heart attack Father 51  . Stroke Mother   . Hypertension Neg Hx     Past Surgical History:  Procedure Laterality Date  . CARDIAC CATHETERIZATION N/A 05/17/2015   Procedure: Left Heart Cath and Coronary Angiography;  Surgeon: Sherren Mocha, MD;  Location: [facility];  Service: Cardiovascular;  Laterality: N/A;  . CARDIAC CATHETERIZATION N/A 05/17/2015   Procedure: Coronary Stent Intervention;  Surgeon: Sherren Mocha, MD;  Location: [facility];  Service: Cardiovascular;  Laterality: N/A;  . None     Social History   Occupational History  .  RETIRED     [employer]   Social History Main Topics  . Smoking status: Former Smoker    Packs/day: 0.25    Years: 40.00    Types: Cigarettes  . Smokeless tobacco: Not on file     Comment: Quit 20 years ago  . Alcohol use 1.2 oz/week    2 Cans of beer per week  . Drug use: No  . Sexual activity: Yes    Birth control/ protection: None

## 2016-09-03 ENCOUNTER — Encounter: Payer: Self-pay | Admitting: *Deleted

## 2016-09-03 ENCOUNTER — Other Ambulatory Visit: Payer: Self-pay | Admitting: *Deleted

## 2016-09-03 DIAGNOSIS — Z006 Encounter for examination for normal comparison and control in clinical research program: Secondary | ICD-10-CM

## 2016-09-03 MED ORDER — ASPIRIN EC 81 MG PO TBEC: 81.0000 mg | DELAYED_RELEASE_TABLET | Freq: Every day | ORAL | 3 refills | Status: DC

## 2016-09-03 NOTE — Progress Notes (Signed)
TWILIGHT Research study month 15 visit completed. Patient c/o large old bruise covering the top of his entire right hand (6cm X 7cm). He states the bruise probably developed around 06/25/16 and still remains present. He did not seek medical attention, but did mention it at his last PCP appointment. He states he has been compliant with research provided medication. After return pill count patient has been 100% compliant with both ASA/PLACEBO and Brilinta. Patient was instructed to start ASA 81 mg daily as instructed by Dr. Percival Spanish. Questions encouraged and answered. Next research required telephone visit (last) will be due no later than 08/MAR/2018.

## 2016-09-06 ENCOUNTER — Ambulatory Visit (HOSPITAL_COMMUNITY)
Admission: RE | Admit: 2016-09-06 | Discharge: 2016-09-06 | Disposition: A | Discharge status: Home / Self Care | Payer: Medicare Other | Source: Ambulatory Visit | Attending: Family | Admitting: Family

## 2016-09-06 DIAGNOSIS — M7501 Adhesive capsulitis of right shoulder: Secondary | ICD-10-CM

## 2016-09-06 DIAGNOSIS — M19011 Primary osteoarthritis, right shoulder: Secondary | ICD-10-CM | POA: Insufficient documentation

## 2016-09-13 ENCOUNTER — Ambulatory Visit (INDEPENDENT_AMBULATORY_CARE_PROVIDER_SITE_OTHER): Payer: Medicare Other | Admitting: Orthopedic Surgery

## 2016-09-13 ENCOUNTER — Encounter (INDEPENDENT_AMBULATORY_CARE_PROVIDER_SITE_OTHER): Payer: Self-pay | Admitting: Orthopedic Surgery

## 2016-09-13 DIAGNOSIS — M19011 Primary osteoarthritis, right shoulder: Secondary | ICD-10-CM | POA: Diagnosis not present

## 2016-09-13 NOTE — Progress Notes (Signed)
Office Visit Note   Patient: Randy Peterson           Date of Birth: [birth year]           MRN: [id number] Visit Date: 09/13/2016              Requested by: Derinda Late, MD [address] [place], [place] [address] PCP: Marylene Land, MD   Assessment & Plan: Visit Diagnoses:  1. Primary osteoarthritis, right shoulder     Plan: Physical therapy for internal and external rotation of the right shoulder and scapular stabilization strengthening. Follow-up as needed. I feel patient has better range of motion that he would with a total shoulder and do not feel that arthroscopic intervention would provide much relief.  Follow-Up Instructions: Return if symptoms worsen or fail to improve.   Orders:  No orders of the defined types were placed in this encounter.  No orders of the defined types were placed in this encounter.     Procedures: No procedures performed   Clinical Data: No additional findings.   Subjective: Chief Complaint  Patient presents with  . Right Shoulder - Follow-up    MRI review right shoulder    Patient presents for follow up right shoulder. He is status post MRI right shoulder. He is here in office today to review his results.     Review of Systems   Objective: Vital Signs: There were no vitals taken for this visit.  Physical Exam on examination patient is alert oriented no adenopathy well-dressed normal affect normal rest wear for he has a normal gait. Patient has almost full range of motion the right shoulder compared to the left but only about 10 lacking internal rotation abduction and flexion. Patient has no pain with drop arm test no pain with Neer and Hawkins impingement test. The MRI scan shows osteoarthritis of the glenohumeral joint with no rotator cuff pathology.  Ortho Exam  Specialty Comments:  No specialty comments available.  Imaging: No results found.   PMFS History: Patient Active Problem List   Diagnosis Date  Noted  . Primary osteoarthritis, right shoulder 09/13/2016  . Angina decubitus ([place]) 05/18/2015  . CAD in native artery 05/18/2015  . Acute MI, inferior wall, initial episode of care ([place]) 05/18/2015  . S/P coronary artery stent placement, DES promus premier, RCA 05/17/15 05/18/2015  . NSTEMI (non-ST elevated myocardial infarction) ([place]) 05/16/2015  . Generalized anxiety disorder 02/04/2012  . Major depressive disorder, recurrent episode, mild ([place]) 02/04/2012  . Major depressive disorder, recurrent episode, severe, without mention of psychotic behavior 12/12/2011  . DYSLIPIDEMIA 08/07/2009  . OBESITY, UNSPECIFIED 08/07/2009  . Chest pain 08/07/2009   Past Medical History:  Diagnosis Date  . Acid reflux   . Acute MI, inferior wall, initial episode of care ([place]) 05/17/15  . Angina decubitus ([place]) 05/16/15  . Anxiety   . CAD in native artery    per cath 05/16/14  . Depression   . Hyperlipidemia LDL goal <70   . Prostate enlargement   . S/P coronary artery stent placement, DES promus premier, RCA 05/17/15 05/18/2015    Family History  Problem Relation Age of Onset  . Dementia Mother   . Stroke Mother   . Heart attack Father 41  . Suicidality Brother   . Depression Brother   . Heart attack Brother 1  . Hypertension Neg Hx     Past Surgical History:  Procedure Laterality Date  . CARDIAC CATHETERIZATION N/A 05/17/2015   Procedure:  Left Heart Cath and Coronary Angiography;  Surgeon: Sherren Mocha, MD;  Location: [facility];  Service: Cardiovascular;  Laterality: N/A;  . CARDIAC CATHETERIZATION N/A 05/17/2015   Procedure: Coronary Stent Intervention;  Surgeon: Sherren Mocha, MD;  Location: [facility];  Service: Cardiovascular;  Laterality: N/A;  . None     Social History   Occupational History  . RETIRED     [employer]   Social History Main Topics  . Smoking status: Former Smoker    Packs/day: 0.25    Years: 40.00    Types: Cigarettes  . Smokeless  tobacco: Not on file     Comment: Quit 20 years ago  . Alcohol use 1.2 oz/week    2 Cans of beer per week  . Drug use: No  . Sexual activity: Yes    Birth control/ protection: None

## 2016-09-24 ENCOUNTER — Encounter (HOSPITAL_COMMUNITY): Payer: Self-pay | Admitting: Psychiatry

## 2016-09-24 ENCOUNTER — Ambulatory Visit (INDEPENDENT_AMBULATORY_CARE_PROVIDER_SITE_OTHER): Payer: Medicare Other | Admitting: Psychiatry

## 2016-09-24 VITALS — BP 126/78 | HR 75 | Ht 66.0 in | Wt 186.8 lb

## 2016-09-24 DIAGNOSIS — Z8249 Family history of ischemic heart disease and other diseases of the circulatory system: Secondary | ICD-10-CM

## 2016-09-24 DIAGNOSIS — Z823 Family history of stroke: Secondary | ICD-10-CM

## 2016-09-24 DIAGNOSIS — Z87891 Personal history of nicotine dependence: Secondary | ICD-10-CM

## 2016-09-24 DIAGNOSIS — Z818 Family history of other mental and behavioral disorders: Secondary | ICD-10-CM

## 2016-09-24 DIAGNOSIS — Z79899 Other long term (current) drug therapy: Secondary | ICD-10-CM

## 2016-09-24 DIAGNOSIS — F330 Major depressive disorder, recurrent, mild: Secondary | ICD-10-CM

## 2016-09-24 DIAGNOSIS — Z7982 Long term (current) use of aspirin: Secondary | ICD-10-CM

## 2016-09-24 MED ORDER — DULOXETINE HCL 20 MG PO CPEP: 20.0000 mg | ORAL_CAPSULE | Freq: Every day | ORAL | 0 refills | Status: DC

## 2016-09-24 MED ORDER — BUPROPION HCL ER (XL) 300 MG PO TB24: ORAL_TABLET | ORAL | 0 refills | Status: DC

## 2016-09-24 NOTE — Progress Notes (Signed)
BH MD/PA/NP OP Progress Note  09/24/2016 2:34 PM Randy Peterson  MRN:  [id number]  Chief Complaint:  Subjective:  I'm taking more Xanax.  I see myself more irritable and depressed.  I spend Christmas with my daughter.  HPI: Randy Peterson came for her follow-up appointment.  He endorse that lately he is taking more Xanax because he feel easily irritable, frustrated and more isolated and withdrawn.  He admitted sometime he is slipping into depression.  He had a quiet Christmas with his daughter.  He used to take Cymbalta along with Wellbutrin however on his last visit in March he decided to come off from Cymbalta.  He remember when he was taking Cymbalta and Wellbutrin he was not taking Xanax but lately he has noticed increase in his Xanax to help his mood swing.  Though he denies any suicidal thoughts, feeling hopeless or worthless but admitted more isolated and withdrawn.  His appetite is okay.  He denies any paranoia or any hallucination.  His energy level is okay.  His vital signs are stable.  Patient lives with his wife and his daughter live close by.  Visit Diagnosis:    ICD-9-CM ICD-10-CM   1. Major depressive disorder, recurrent episode, mild (HCC) 296.31 F33.0 buPROPion (WELLBUTRIN XL) 300 MG 24 hr tablet     DULoxetine (CYMBALTA) 20 MG capsule    Past Psychiatric History: Patient has psychiatric inpatient in 2008 due to suicidal attempt.  At that time he cut his wrists.  He was depressed due to his job and finances.  In the past he had tried Paxil, Risperdal and he was also given Vyvanse to help his attention and focus.  He used to take Cymbalta along with Wellbutrin however it was discontinued because he wanted to come off from Cymbalta.  Past Medical History:  Patient has acid reflux, chronic back pain and enlarged prostate. Past Medical History:  Diagnosis Date  . Acid reflux   . Acute MI, inferior wall, initial episode of care ([place]) 05/17/15  . Angina decubitus ([place]) 05/16/15  . Anxiety    . CAD in native artery    per cath 05/16/14  . Depression   . Hyperlipidemia LDL goal <70   . Prostate enlargement   . S/P coronary artery stent placement, DES promus premier, RCA 05/17/15 05/18/2015    Past Surgical History:  Procedure Laterality Date  . CARDIAC CATHETERIZATION N/A 05/17/2015   Procedure: Left Heart Cath and Coronary Angiography;  Surgeon: Sherren Mocha, MD;  Location: [facility];  Service: Cardiovascular;  Laterality: N/A;  . CARDIAC CATHETERIZATION N/A 05/17/2015   Procedure: Coronary Stent Intervention;  Surgeon: Sherren Mocha, MD;  Location: [facility];  Service: Cardiovascular;  Laterality: N/A;  . None      Family Psychiatric History: Reviewed.  Family History:  Family History  Problem Relation Age of Onset  . Dementia Mother   . Stroke Mother   . Heart attack Father 69  . Suicidality Brother   . Depression Brother   . Heart attack Brother 10  . Hypertension Neg Hx     Social History:  Social History   Social History  . Marital status: Married    Spouse name: N/A  . Number of children: 1  . Years of education: N/A   Occupational History  . RETIRED     [employer]   Social History Main Topics  . Smoking status: Former Smoker    Packs/day: 0.25    Years: 40.00  Types: Cigarettes  . Smokeless tobacco: None     Comment: Quit 20 years ago  . Alcohol use 1.2 oz/week    2 Cans of beer per week  . Drug use: No  . Sexual activity: Yes    Birth control/ protection: None   Other Topics Concern  . None   Social History Narrative   Retired [employer]    Allergies: No Known Allergies  Metabolic Disorder Labs: Lab Results  Component Value Date   HGBA1C 5.6 05/18/2015   MPG 114 05/18/2015   No results found for: PROLACTIN Lab Results  Component Value Date   CHOL 188 05/17/2015   TRIG 59 05/17/2015   HDL 49 05/17/2015   CHOLHDL 3.8 05/17/2015   VLDL 12 05/17/2015   LDLCALC 127 (H) 05/17/2015   [place] 122 08/30/2009      Current Medications: Current Outpatient Prescriptions  Medication Sig Dispense Refill  . acetaminophen (TYLENOL) 325 MG tablet Take 2 tablets (650 mg total) by mouth every 4 (four) hours as needed for headache or mild pain.    Marland Kitchen ALPRAZolam (XANAX) 1 MG tablet Take 1 tablet (1 mg total) by mouth daily as needed for anxiety. 90 tablet 0  . aspirin EC 81 MG tablet Take 1 tablet (81 mg total) by mouth daily. 90 tablet 3  . atorvastatin (LIPITOR) 80 MG tablet Take 1 tablet (80 mg total) by mouth daily at 6 PM. 30 tablet 6  . buPROPion (WELLBUTRIN XL) 300 MG 24 hr tablet TAKE 1 TABLET(300 MG) BY MOUTH DAILY 90 tablet 0  . cholecalciferol (VITAMIN D) 1000 UNITS tablet Take 1,000 Units by mouth daily.    . cycloSPORINE (RESTASIS) 0.05 % ophthalmic emulsion Place 1 drop into both eyes 2 (two) times daily. For dry eyes per patient    . DULoxetine (CYMBALTA) 20 MG capsule Take 1 capsule (20 mg total) by mouth daily. 90 capsule 0  . finasteride (PROSCAR) 5 MG tablet Take 5 mg by mouth daily.    . metoprolol tartrate (LOPRESSOR) 25 MG tablet Take 1 tablet (25 mg total) by mouth 2 (two) times daily. 180 tablet 1  . nitroGLYCERIN (NITROSTAT) 0.4 MG SL tablet PLACE 1 TABLET UNDER THE TONGUE EVERY 5 MINUTES AS NEEDED FOR CHEST PAIN(OR SHORTNESS OF BREATH) 25 tablet 2  . pantoprazole (PROTONIX) 40 MG tablet Take 40 mg by mouth daily.    . ranitidine (ZANTAC) 150 MG tablet Take 150 mg by mouth every evening.     . tamsulosin (FLOMAX) 0.4 MG CAPS capsule Take 0.4 mg by mouth daily.      No current facility-administered medications for this visit.     Neurologic: Headache: No Seizure: No Paresthesias: No  Musculoskeletal: Strength & Muscle Tone: within normal limits Gait & Station: normal Patient leans: N/A  Psychiatric Specialty Exam: ROS  Blood pressure 126/78, pulse 75, height 5\' 6"  (1.676 m), weight 186 lb 12.8 oz (84.7 kg).Body mass index is 30.15 kg/m.  General Appearance: Casual  Eye  Contact:  Good  Speech:  Clear and Coherent  Volume:  Normal  Mood:  Dysphoric  Affect:  Congruent  Thought Process:  Goal Directed  Orientation:  Full (Time, Place, and Person)  Thought Content: WDL and Logical   Suicidal Thoughts:  No  Homicidal Thoughts:  No  Memory:  Immediate;   Good Recent;   Good Remote;   Good  Judgement:  Good  Insight:  Good  Psychomotor Activity:  Normal  Concentration:  Concentration: Good and  Attention Span: Good  Recall:  Good  Fund of Knowledge: Good  Language: Good  Akathisia:  No  Handed:  Right  AIMS (if indicated):  None reported   Assets:  Communication Skills Desire for Improvement Housing Physical Health  ADL's:  Intact  Cognition: WNL  Sleep:  Good     Assessment: Randy Peterson is a 70 year old Caucasian man with history of major depressive disorder, recurrent came for his follow-up appointment.  Plan: Patient has noticed increase in his depression, irritability and mood swing.  We discussed to restart low-dose Cymbalta which had helped him in the past.  Patient agreed with the plan.  We will start Cymbalta 20 mg daily and he will continue Wellbutrin XL 300 mg daily.  Recommended to use Xanax only as needed.  He has refills remaining on Xanax and does not need a new prescription.  Discussed medication side effects and benefits.  Recommended to call us back if he has any question, concern if he feel worsening of the symptom.  Follow-up in 3 months.  Chania Kochanski T., MD 09/24/2016, 2:34 PM

## 2016-10-29 DIAGNOSIS — K5790 Diverticulosis of intestine, part unspecified, without perforation or abscess without bleeding: Secondary | ICD-10-CM | POA: Insufficient documentation

## 2016-10-29 DIAGNOSIS — Z86010 Personal history of colonic polyps: Secondary | ICD-10-CM | POA: Insufficient documentation

## 2016-10-29 DIAGNOSIS — K648 Other hemorrhoids: Secondary | ICD-10-CM | POA: Insufficient documentation

## 2016-10-31 ENCOUNTER — Encounter: Payer: Self-pay | Admitting: *Deleted

## 2016-10-31 DIAGNOSIS — Z006 Encounter for examination for normal comparison and control in clinical research program: Secondary | ICD-10-CM

## 2016-10-31 NOTE — Progress Notes (Addendum)
TWILIGHT research study month 18 follow up visit completed. Patient continues on ASA 81 mg daily without any problems. I questioned him about bruise that he reported @ last visit. He stated it resolved around Christmas.I thanked him for participating in the research study and he thanked [place] for the continued follow up.

## 2016-11-30 ENCOUNTER — Other Ambulatory Visit (HOSPITAL_COMMUNITY): Payer: Self-pay | Admitting: Psychiatry

## 2016-12-02 NOTE — Telephone Encounter (Signed)
Need to see primary care physician

## 2016-12-10 ENCOUNTER — Ambulatory Visit (HOSPITAL_COMMUNITY): Payer: Self-pay | Admitting: Psychiatry

## 2016-12-12 ENCOUNTER — Ambulatory Visit (INDEPENDENT_AMBULATORY_CARE_PROVIDER_SITE_OTHER): Payer: Medicare Other | Admitting: Psychiatry

## 2016-12-12 ENCOUNTER — Encounter (HOSPITAL_COMMUNITY): Payer: Self-pay | Admitting: Psychiatry

## 2016-12-12 DIAGNOSIS — Z810 Family history of intellectual disabilities: Secondary | ICD-10-CM

## 2016-12-12 DIAGNOSIS — F330 Major depressive disorder, recurrent, mild: Secondary | ICD-10-CM

## 2016-12-12 DIAGNOSIS — Z87891 Personal history of nicotine dependence: Secondary | ICD-10-CM

## 2016-12-12 DIAGNOSIS — Z79899 Other long term (current) drug therapy: Secondary | ICD-10-CM | POA: Diagnosis not present

## 2016-12-12 MED ORDER — DULOXETINE HCL 20 MG PO CPEP: 20.0000 mg | ORAL_CAPSULE | Freq: Every day | ORAL | 0 refills | Status: DC

## 2016-12-12 MED ORDER — BUPROPION HCL ER (XL) 300 MG PO TB24: ORAL_TABLET | ORAL | 0 refills | Status: DC

## 2016-12-12 NOTE — Progress Notes (Signed)
BH MD/PA/NP OP Progress Note  12/12/2016 10:31 AM Randy Peterson  MRN:  [id number]  Chief Complaint:  Subjective:  I am doing better with Cymbalta.  I have more energy.  HPI: Randy Peterson came for his follow-up appointment.  He is taking Cymbalta 20 mg along with Wellbutrin.  Cymbalta was added last visit and he has seen much improvement in his depression and energy level.  He is less anxious, less irritable and more active and social.  He lost 3 pounds since the last visit because he's been watching his calorie intake and also more active.  He has no tremors or shakes.  He has cut down his Xanax and has not taken in a while.  His appetite is okay.  He denies any crying spells or any suicidal thoughts.  Patient lives with his wife and his daughter live close by.  Patient denies drinking alcohol or using any illegal substances.  He denies any feeling of hopelessness, worthlessness or helplessness.  Like to continue Wellbutrin and Cymbalta.  Visit Diagnosis:    ICD-9-CM ICD-10-CM   1. Major depressive disorder, recurrent episode, mild (HCC) 296.31 F33.0 DULoxetine (CYMBALTA) 20 MG capsule     buPROPion (WELLBUTRIN XL) 300 MG 24 hr tablet    Past Psychiatric History: Reviewed.   Patient has psychiatric inpatient in 2008 due to suicidal attempt.  At that time he cut his wrists.  He was depressed due to his job and finances.  In the past he had tried Paxil, Risperdal and he was also given Vyvanse to help his attention and focus.  He used to take Cymbalta along with Wellbutrin however it was discontinued because he wanted to come off from Cymbalta.  Past Medical History:  Past Medical History:  Diagnosis Date  . Acid reflux   . Acute MI, inferior wall, initial episode of care ([place]) 05/17/15  . Angina decubitus ([place]) 05/16/15  . Anxiety   . CAD in native artery    per cath 05/16/14  . Depression   . Hyperlipidemia LDL goal <70   . Prostate enlargement   . S/P coronary artery stent placement, DES  promus premier, RCA 05/17/15 05/18/2015    Past Surgical History:  Procedure Laterality Date  . CARDIAC CATHETERIZATION N/A 05/17/2015   Procedure: Left Heart Cath and Coronary Angiography;  Surgeon: Sherren Mocha, MD;  Location: [facility];  Service: Cardiovascular;  Laterality: N/A;  . CARDIAC CATHETERIZATION N/A 05/17/2015   Procedure: Coronary Stent Intervention;  Surgeon: Sherren Mocha, MD;  Location: [facility];  Service: Cardiovascular;  Laterality: N/A;  . None      Family Psychiatric History: Reviewed.    Family History:  Family History  Problem Relation Age of Onset  . Dementia Mother   . Stroke Mother   . Heart attack Father 91  . Suicidality Brother   . Depression Brother   . Heart attack Brother 1  . Hypertension Neg Hx     Social History:  Social History   Social History  . Marital status: Married    Spouse name: N/A  . Number of children: 1  . Years of education: N/A   Occupational History  . RETIRED     [employer]   Social History Main Topics  . Smoking status: Former Smoker    Packs/day: 0.25    Years: 40.00    Types: Cigarettes  . Smokeless tobacco: Never Used     Comment: Quit 20 years ago  . Alcohol  use 1.2 oz/week    2 Cans of beer per week  . Drug use: No  . Sexual activity: Yes    Birth control/ protection: None   Other Topics Concern  . None   Social History Narrative   Retired [employer]    Allergies: No Known Allergies  Metabolic Disorder Labs: Lab Results  Component Value Date   HGBA1C 5.6 05/18/2015   MPG 114 05/18/2015   No results found for: PROLACTIN Lab Results  Component Value Date   CHOL 188 05/17/2015   TRIG 59 05/17/2015   HDL 49 05/17/2015   CHOLHDL 3.8 05/17/2015   VLDL 12 05/17/2015   LDLCALC 127 (H) 05/17/2015   [place] 122 08/30/2009     Current Medications: Current Outpatient Prescriptions  Medication Sig Dispense Refill  . acetaminophen (TYLENOL) 325 MG tablet Take 2 tablets (650  mg total) by mouth every 4 (four) hours as needed for headache or mild pain.    Marland Kitchen ALPRAZolam (XANAX) 1 MG tablet Take 1 tablet (1 mg total) by mouth daily as needed for anxiety. 90 tablet 0  . aspirin EC 81 MG tablet Take 1 tablet (81 mg total) by mouth daily. 90 tablet 3  . atorvastatin (LIPITOR) 80 MG tablet Take 1 tablet (80 mg total) by mouth daily at 6 PM. 30 tablet 6  . buPROPion (WELLBUTRIN XL) 300 MG 24 hr tablet TAKE 1 TABLET(300 MG) BY MOUTH DAILY 90 tablet 0  . cholecalciferol (VITAMIN D) 1000 UNITS tablet Take 1,000 Units by mouth daily.    . cycloSPORINE (RESTASIS) 0.05 % ophthalmic emulsion Place 1 drop into both eyes 2 (two) times daily. For dry eyes per patient    . DULoxetine (CYMBALTA) 20 MG capsule Take 1 capsule (20 mg total) by mouth daily. 90 capsule 0  . finasteride (PROSCAR) 5 MG tablet Take 5 mg by mouth daily.    . metoprolol tartrate (LOPRESSOR) 25 MG tablet Take 1 tablet (25 mg total) by mouth 2 (two) times daily. 180 tablet 1  . nitroGLYCERIN (NITROSTAT) 0.4 MG SL tablet PLACE 1 TABLET UNDER THE TONGUE EVERY 5 MINUTES AS NEEDED FOR CHEST PAIN(OR SHORTNESS OF BREATH) 25 tablet 2  . pantoprazole (PROTONIX) 40 MG tablet Take 40 mg by mouth daily.    . ranitidine (ZANTAC) 150 MG tablet Take 150 mg by mouth every evening.     . tamsulosin (FLOMAX) 0.4 MG CAPS capsule Take 0.4 mg by mouth daily.      No current facility-administered medications for this visit.     Neurologic: Headache: No Seizure: No Paresthesias: No  Musculoskeletal: Strength & Muscle Tone: within normal limits Gait & Station: normal Patient leans: N/A  Psychiatric Specialty Exam: ROS  Blood pressure 126/70, pulse (!) 59, height 5\' 7"  (1.702 m), weight 183 lb 3.2 oz (83.1 kg).Body mass index is 28.69 kg/m.  General Appearance: Casual  Eye Contact:  Good  Speech:  Clear and Coherent  Volume:  Normal  Mood:  Euthymic  Affect:  Congruent  Thought Process:  Goal Directed  Orientation:  Full  (Time, Place, and Person)  Thought Content: WDL and Logical   Suicidal Thoughts:  No  Homicidal Thoughts:  No  Memory:  Immediate;   Good Recent;   Good Remote;   Good  Judgement:  Good  Insight:  Good  Psychomotor Activity:  Normal  Concentration:  Concentration: Good and Attention Span: Good  Recall:  Good  Fund of Knowledge: Good  Language: Good  Akathisia:  No  Handed:  Right  AIMS (if indicated):  0  Assets:  Communication Skills Desire for Improvement Housing Physical Health Resilience Social Support  ADL's:  Intact  Cognition: WNL  Sleep:  Good    Assessment: Major depressive disorder, recurrent.  Plan: Patient is doing better since Cymbalta added.  Continue Wellbutrin XL 300 mg daily and Cymbalta 20 mg daily.  He has no tremors shakes or any EPS.  He has cut down his Xanax and does not need a new prescription.  Discussed medication side effects and benefits.  Recommended to call us back if he has any question, concern or if he feel worsening of the symptom.  Follow-up in 3 months.  Neha Waight T., MD 12/12/2016, 10:31 AM

## 2016-12-23 ENCOUNTER — Ambulatory Visit (HOSPITAL_COMMUNITY): Payer: Self-pay | Admitting: Psychiatry

## 2017-01-27 NOTE — Progress Notes (Signed)
Cardiology Office Note   Date:  01/28/2017   ID:  Randy Peterson, DOB [birth year], MRN [id number]  PCP:  Derinda Late, MD  Cardiologist:   Minus Breeding, MD   Chief Complaint  Patient presents with  . Coronary Artery Disease      History of Present Illness: Randy Peterson is a 71 y.o. male who presents for follow-up of inferior myocardial infarction. 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 He was enrolled in the [place] research study.  He has participated in cardiac rehabilitation.  He is not exercising as much as I would like but he does do some exercise and is active in yard work and recently took a trip with much walking. The patient denies any new symptoms such as chest discomfort, neck or arm discomfort. There has been no new shortness of breath, PND or orthopnea. There have been no reported palpitations, presyncope or syncope. Past Medical History:  Diagnosis Date  . Acid reflux   . Acute MI, inferior wall, initial episode of care ([place]) 05/17/15  . Angina decubitus ([place]) 05/16/15  . Anxiety   . CAD in native artery    per cath 05/16/14  . Depression   . Hyperlipidemia LDL goal <70   . Prostate enlargement   . S/P coronary artery stent placement, DES promus premier, RCA 05/17/15 05/18/2015    Past Surgical History:  Procedure Laterality Date  . CARDIAC CATHETERIZATION N/A 05/17/2015   Procedure: Left Heart Cath and Coronary Angiography;  Surgeon: Sherren Mocha, MD;  Location: [facility];  Service: Cardiovascular;  Laterality: N/A;  . CARDIAC CATHETERIZATION N/A 05/17/2015   Procedure: Coronary Stent Intervention;  Surgeon: Sherren Mocha, MD;  Location: [facility];  Service: Cardiovascular;  Laterality: N/A;  . None       Current Outpatient Prescriptions  Medication Sig Dispense Refill  . acetaminophen (TYLENOL) 325 MG tablet Take 2 tablets (650 mg total) by mouth every 4 (four) hours as needed for headache or mild  pain.    Marland Kitchen ALPRAZolam (XANAX) 1 MG tablet Take 1 tablet (1 mg total) by mouth daily as needed for anxiety. 90 tablet 0  . aspirin EC 81 MG tablet Take 1 tablet (81 mg total) by mouth daily. 90 tablet 3  . atorvastatin (LIPITOR) 80 MG tablet Take 1 tablet (80 mg total) by mouth daily at 6 PM. 30 tablet 6  . buPROPion (WELLBUTRIN XL) 300 MG 24 hr tablet TAKE 1 TABLET(300 MG) BY MOUTH DAILY 90 tablet 0  . cholecalciferol (VITAMIN D) 1000 UNITS tablet Take 1,000 Units by mouth daily.    . cycloSPORINE (RESTASIS) 0.05 % ophthalmic emulsion Place 1 drop into both eyes 2 (two) times daily. For dry eyes per patient    . DULoxetine (CYMBALTA) 20 MG capsule Take 1 capsule (20 mg total) by mouth daily. 90 capsule 0  . finasteride (PROSCAR) 5 MG tablet Take 5 mg by mouth daily.    . metoprolol tartrate (LOPRESSOR) 25 MG tablet Take 1 tablet (25 mg total) by mouth 2 (two) times daily. 180 tablet 1  . nitroGLYCERIN (NITROSTAT) 0.4 MG SL tablet PLACE 1 TABLET UNDER THE TONGUE EVERY 5 MINUTES AS NEEDED FOR CHEST PAIN(OR SHORTNESS OF BREATH) 25 tablet 2  . ranitidine (ZANTAC) 150 MG tablet Take 150 mg by mouth every evening.     . tamsulosin (FLOMAX) 0.4 MG CAPS capsule Take 0.4 mg by mouth daily.  No current facility-administered medications for this visit.     Allergies:   Patient has no known allergies.    ROS:  Please see the history of present illness.   Otherwise, review of systems are positive for none.   All other systems are reviewed and negative.    PHYSICAL EXAM: VS:  BP 106/64   Pulse 69   Ht 5\' 7"  (1.702 m)   Wt 186 lb (84.4 kg)   BMI 29.13 kg/m  , BMI Body mass index is 29.13 kg/m.  GENERAL:  Well appearing NECK:  No jugular venous distention, waveform within normal limits, carotid upstroke brisk and symmetric, no bruits, no thyromegaly LUNGS:  Clear to auscultation bilaterally BACK:  No CVA tenderness CHEST:  Unremarkable HEART:  PMI not displaced or sustained,S1 and S2 within  normal limits, no S3, no S4, no clicks, no rubs, no murmurs ABD:  Flat, positive bowel sounds normal in frequency in pitch, no bruits, no rebound, no guarding, no midline pulsatile mass, no hepatomegaly, no splenomegaly EXT:  2 plus pulses throughout, no edema, no cyanosis no clubbing   EKG:  EKG is  ordered today. Sinus rhythm, rate 69, axis within normal limits, intervals within normal limits, no acute ST-T wave changes.  Recent Labs: No results found for requested labs within last 8760 hours.    Lipid Panel    Component Value Date/Time   CHOL 188 05/17/2015 0600   TRIG 59 05/17/2015 0600   HDL 49 05/17/2015 0600   CHOLHDL 3.8 05/17/2015 0600   VLDL 12 05/17/2015 0600   LDLCALC 127 (H) 05/17/2015 0600      Wt Readings from Last 3 Encounters:  01/28/17 186 lb (84.4 kg)  07/29/16 186 lb (84.4 kg)  01/11/16 186 lb 4 oz (84.5 kg)      Other studies Reviewed: Additional studies/ records that were reviewed today include:  None Review of the above records demonstrates:      ASSESSMENT AND PLAN:  CAD:  The patient has no new sypmtoms.  No further cardiovascular testing is indicated.  We will continue with aggressive risk reduction and meds as listed.  I have prescribed more exercise  DYSLIPIDEMIA:   This is followed by  Derinda Late, MD . The patient understands the ideal LDL would be less than 70.  I will defer to his management  Current medicines are reviewed at length with the patient today.  The patient does not have concerns regarding medicines.  The following changes have been made:   None  Labs/ tests ordered today include: None  Orders Placed This Encounter  Procedures  . EKG 12-Lead     Disposition:   FU with 12 months.      Signed, Minus Breeding, MD  01/28/2017 9:04 PM    [place]

## 2017-01-28 ENCOUNTER — Ambulatory Visit (INDEPENDENT_AMBULATORY_CARE_PROVIDER_SITE_OTHER): Payer: Medicare Other | Admitting: Cardiology

## 2017-01-28 ENCOUNTER — Ambulatory Visit: Payer: Self-pay | Admitting: Cardiology

## 2017-01-28 ENCOUNTER — Encounter: Payer: Self-pay | Admitting: Cardiology

## 2017-01-28 VITALS — BP 106/64 | HR 69 | Ht 67.0 in | Wt 186.0 lb

## 2017-01-28 DIAGNOSIS — E785 Hyperlipidemia, unspecified: Secondary | ICD-10-CM | POA: Diagnosis not present

## 2017-01-28 DIAGNOSIS — I2510 Atherosclerotic heart disease of native coronary artery without angina pectoris: Secondary | ICD-10-CM | POA: Diagnosis not present

## 2017-01-28 NOTE — Patient Instructions (Signed)
Medication Instructions: No changes  Follow-Up: Your physician wants you to follow-up in: 12 month with Dr. Percival Spanish. You will receive a reminder letter in the mail two months in advance. If you don't receive a letter, please call our office to schedule the follow-up appointment.   If you need a refill on your cardiac medications before your next appointment, please call your pharmacy.

## 2017-02-28 ENCOUNTER — Ambulatory Visit: Payer: Self-pay | Admitting: Cardiology

## 2017-03-13 ENCOUNTER — Ambulatory Visit (HOSPITAL_COMMUNITY): Payer: Self-pay | Admitting: Psychiatry

## 2017-03-24 ENCOUNTER — Ambulatory Visit (HOSPITAL_COMMUNITY): Payer: Self-pay | Admitting: Psychiatry

## 2017-03-25 ENCOUNTER — Other Ambulatory Visit (HOSPITAL_COMMUNITY): Payer: Self-pay

## 2017-03-25 ENCOUNTER — Encounter (HOSPITAL_COMMUNITY): Payer: Self-pay | Admitting: Psychiatry

## 2017-03-25 ENCOUNTER — Ambulatory Visit (INDEPENDENT_AMBULATORY_CARE_PROVIDER_SITE_OTHER): Payer: Medicare Other | Admitting: Psychiatry

## 2017-03-25 DIAGNOSIS — Z87891 Personal history of nicotine dependence: Secondary | ICD-10-CM | POA: Diagnosis not present

## 2017-03-25 DIAGNOSIS — Z810 Family history of intellectual disabilities: Secondary | ICD-10-CM

## 2017-03-25 DIAGNOSIS — F330 Major depressive disorder, recurrent, mild: Secondary | ICD-10-CM

## 2017-03-25 DIAGNOSIS — Z818 Family history of other mental and behavioral disorders: Secondary | ICD-10-CM | POA: Diagnosis not present

## 2017-03-25 MED ORDER — DULOXETINE HCL 20 MG PO CPEP: 20.0000 mg | ORAL_CAPSULE | Freq: Every day | ORAL | 0 refills | Status: DC

## 2017-03-25 MED ORDER — BUPROPION HCL ER (XL) 300 MG PO TB24: ORAL_TABLET | ORAL | 0 refills | Status: DC

## 2017-03-25 NOTE — Progress Notes (Signed)
[facility] MD/PA/NP OP Progress Note  03/25/2017 2:24 PM Randy Peterson  MRN:  [id number]  Chief Complaint:  Chief Complaint    Follow-up     Subjective:  I'm doing better.  HPI: Randy Peterson came for his follow-up appointment.  He is taking his medication without any side effects.  He has no rash or itching.  Recently he had a cruise trip with his wife.  Patient is less anxious and less depressed.  He is more active social and denies any suicidal thoughts.  He has no tremors shakes or any EPS.  His energy level is good.  He cut down the Xanax only takes once in a while.  He still has refill remaining on Xanax.  He sleeping good.  He denies any paranoia or any hallucination.  His vital signs are stable.  Visit Diagnosis:    ICD-10-CM   1. Major depressive disorder, recurrent episode, mild (HCC) F33.0 DULoxetine (CYMBALTA) 20 MG capsule    buPROPion (WELLBUTRIN XL) 300 MG 24 hr tablet    Past Psychiatric History: Reviewed. Patient has psychiatric inpatient in 2008 due to suicidal attempt. At that time he cut his wrists. He was depressed due to his job and finances. In the past he had tried Paxil, Risperdal and he was also given Vyvanse to help his attention and focus. He used to take Cymbalta along with Wellbutrin however it was discontinued because he wanted to come off from Cymbalta.  Past Medical History:  Past Medical History:  Diagnosis Date  . Acid reflux   . Acute MI, inferior wall, initial episode of care ([place]) 05/17/15  . Angina decubitus ([place]) 05/16/15  . Anxiety   . CAD in native artery    per cath 05/16/14  . Depression   . Hyperlipidemia LDL goal <70   . Prostate enlargement   . S/P coronary artery stent placement, DES promus premier, RCA 05/17/15 05/18/2015    Past Surgical History:  Procedure Laterality Date  . CARDIAC CATHETERIZATION N/A 05/17/2015   Procedure: Left Heart Cath and Coronary Angiography;  Surgeon: Sherren Mocha, MD;  Location: [facility];  Service:  Cardiovascular;  Laterality: N/A;  . CARDIAC CATHETERIZATION N/A 05/17/2015   Procedure: Coronary Stent Intervention;  Surgeon: Sherren Mocha, MD;  Location: [facility];  Service: Cardiovascular;  Laterality: N/A;  . None      Family Psychiatric History: Reviewed.  Family History:  Family History  Problem Relation Age of Onset  . Dementia Mother   . Stroke Mother   . Heart attack Father 44  . Suicidality Brother   . Depression Brother   . Heart attack Brother 11  . Hypertension Neg Hx     Social History:  Social History   Social History  . Marital status: Married    Spouse name: N/A  . Number of children: 1  . Years of education: N/A   Occupational History  . RETIRED     [employer]   Social History Main Topics  . Smoking status: Former Smoker    Packs/day: 0.25    Years: 40.00    Types: Cigarettes  . Smokeless tobacco: Never Used     Comment: Quit 20 years ago  . Alcohol use 1.2 oz/week    2 Cans of beer per week  . Drug use: No  . Sexual activity: Yes    Partners: Female    Birth control/ protection: None   Other Topics Concern  . None   Social  History Narrative   Retired [employer]    Allergies: No Known Allergies  Metabolic Disorder Labs: Lab Results  Component Value Date   HGBA1C 5.6 05/18/2015   MPG 114 05/18/2015   No results found for: PROLACTIN Lab Results  Component Value Date   CHOL 188 05/17/2015   TRIG 59 05/17/2015   HDL 49 05/17/2015   CHOLHDL 3.8 05/17/2015   VLDL 12 05/17/2015   LDLCALC 127 (H) 05/17/2015   [place] 122 08/30/2009     Current Medications: Current Outpatient Prescriptions  Medication Sig Dispense Refill  . acetaminophen (TYLENOL) 325 MG tablet Take 2 tablets (650 mg total) by mouth every 4 (four) hours as needed for headache or mild pain.    Marland Kitchen ALPRAZolam (XANAX) 1 MG tablet Take 1 tablet (1 mg total) by mouth daily as needed for anxiety. 90 tablet 0  . aspirin EC 81 MG tablet Take 1 tablet (81 mg  total) by mouth daily. 90 tablet 3  . atorvastatin (LIPITOR) 80 MG tablet Take 1 tablet (80 mg total) by mouth daily at 6 PM. 30 tablet 6  . buPROPion (WELLBUTRIN XL) 300 MG 24 hr tablet TAKE 1 TABLET(300 MG) BY MOUTH DAILY 90 tablet 0  . cholecalciferol (VITAMIN D) 1000 UNITS tablet Take 1,000 Units by mouth daily.    . cycloSPORINE (RESTASIS) 0.05 % ophthalmic emulsion Place 1 drop into both eyes 2 (two) times daily. For dry eyes per patient    . DULoxetine (CYMBALTA) 20 MG capsule Take 1 capsule (20 mg total) by mouth daily. 90 capsule 0  . finasteride (PROSCAR) 5 MG tablet Take 5 mg by mouth daily.    . metoprolol tartrate (LOPRESSOR) 25 MG tablet Take 1 tablet (25 mg total) by mouth 2 (two) times daily. 180 tablet 1  . nitroGLYCERIN (NITROSTAT) 0.4 MG SL tablet PLACE 1 TABLET UNDER THE TONGUE EVERY 5 MINUTES AS NEEDED FOR CHEST PAIN(OR SHORTNESS OF BREATH) 25 tablet 2  . ranitidine (ZANTAC) 150 MG tablet Take 150 mg by mouth every evening.     . tamsulosin (FLOMAX) 0.4 MG CAPS capsule Take 0.4 mg by mouth daily.      No current facility-administered medications for this visit.     Neurologic: Headache: No Seizure: No Paresthesias: No  Musculoskeletal: Strength & Muscle Tone: within normal limits Gait & Station: normal Patient leans: N/A  Psychiatric Specialty Exam: ROS  Blood pressure 108/72, pulse 74, height 5' 6.25" (1.683 m), weight 186 lb (84.4 kg), SpO2 97 %.Body mass index is 29.8 kg/m.  General Appearance: Casual  Eye Contact:  Good  Speech:  Clear and Coherent  Volume:  Normal  Mood:  Euthymic  Affect:  Appropriate  Thought Process:  Goal Directed  Orientation:  Full (Time, Place, and Person)  Thought Content: Logical   Suicidal Thoughts:  No  Homicidal Thoughts:  No  Memory:  Immediate;   Good Recent;   Good Remote;   Good  Judgement:  Good  Insight:  Good  Psychomotor Activity:  Normal  Concentration:  Concentration: Good and Attention Span: Good  Recall:   Good  Fund of Knowledge: Good  Language: Good  Akathisia:  No  Handed:  Right  AIMS (if indicated):  0  Assets:  Communication Skills Desire for Improvement Housing Resilience Social Support  ADL's:  Intact  Cognition: WNL  Sleep:  ok   Assessment: Major depressive disorder, recurrent  Plan: Patient is a stable on his current medication.  Continue Cymbalta 20 mg daily  and Wellbutrin XL 300 mg daily.  He has no tremors shakes or any EPS.  He does not need a new prescription of Xanax at this time.  Recommended to call us back if he is any question or any concern.  Follow-up in 3 months.  Ilya Ess T., MD 03/25/2017, 2:24 PM

## 2017-06-25 ENCOUNTER — Encounter (HOSPITAL_COMMUNITY): Payer: Self-pay | Admitting: Psychiatry

## 2017-06-25 ENCOUNTER — Ambulatory Visit (INDEPENDENT_AMBULATORY_CARE_PROVIDER_SITE_OTHER): Payer: Medicare Other | Admitting: Psychiatry

## 2017-06-25 DIAGNOSIS — F330 Major depressive disorder, recurrent, mild: Secondary | ICD-10-CM

## 2017-06-25 DIAGNOSIS — Z818 Family history of other mental and behavioral disorders: Secondary | ICD-10-CM

## 2017-06-25 DIAGNOSIS — Z810 Family history of intellectual disabilities: Secondary | ICD-10-CM | POA: Diagnosis not present

## 2017-06-25 DIAGNOSIS — Z87891 Personal history of nicotine dependence: Secondary | ICD-10-CM | POA: Diagnosis not present

## 2017-06-25 MED ORDER — BUPROPION HCL ER (XL) 300 MG PO TB24: ORAL_TABLET | ORAL | 0 refills | Status: DC

## 2017-06-25 MED ORDER — DULOXETINE HCL 20 MG PO CPEP: 20.0000 mg | ORAL_CAPSULE | Freq: Every day | ORAL | 0 refills | Status: DC

## 2017-06-25 NOTE — Progress Notes (Signed)
[facility] MD/PA/NP OP Progress Note  06/25/2017 2:37 PM Randy Peterson  MRN:  [id number]  Chief Complaint:  I am doing good.  HPI: Randy Peterson came for his follow-up appointment.  He is compliant with his medication without any side effects.  He sleeping good.  He denies any irritability, anger, mania or any psychosis.  He had a good summer and he went to [place] with his family and had a good time.  Patient denies any crying spells or any feeling of hopelessness or worthlessness.  He does not want to change his medication since it is working very well.  He has no tremors shakes or any EPS.  Denies any anhedonia or suicidal thoughts.  He rarely takes Xanax and he do not remember when was the last time he took it.  His appetite is okay.  His vital signs are stable.  He is scheduled to see his primary care physician Dr. Sandi Mariscal in November.  Visit Diagnosis:    ICD-10-CM   1. Major depressive disorder, recurrent episode, mild (HCC) F33.0 DULoxetine (CYMBALTA) 20 MG capsule    buPROPion (WELLBUTRIN XL) 300 MG 24 hr tablet    Past Psychiatric History: Reviewed. Patient has psychiatric inpatient in 2008 due to suicidal attempt when he cut his wrist.He was depressed due to his job and finances. In the past he had tried Paxil, Risperdal and he was also given Vyvanse to help his attention and focus. He had a good response with Cymbalta and Wellbutrin and he stopped these medication after feeling better but relapsed into severe depression.    Past Medical History:  Past Medical History:  Diagnosis Date  . Acid reflux   . Acute MI, inferior wall, initial episode of care ([place]) 05/17/15  . Angina decubitus ([place]) 05/16/15  . Anxiety   . CAD in native artery    per cath 05/16/14  . Depression   . Hyperlipidemia LDL goal <70   . Prostate enlargement   . S/P coronary artery stent placement, DES promus premier, RCA 05/17/15 05/18/2015    Past Surgical History:  Procedure Laterality Date  . CARDIAC CATHETERIZATION  N/A 05/17/2015   Procedure: Left Heart Cath and Coronary Angiography;  Surgeon: Sherren Mocha, MD;  Location: [facility];  Service: Cardiovascular;  Laterality: N/A;  . CARDIAC CATHETERIZATION N/A 05/17/2015   Procedure: Coronary Stent Intervention;  Surgeon: Sherren Mocha, MD;  Location: [facility];  Service: Cardiovascular;  Laterality: N/A;  . None      Family Psychiatric History: Reviewed.  Family History:  Family History  Problem Relation Age of Onset  . Dementia Mother   . Stroke Mother   . Heart attack Father 28  . Suicidality Brother   . Depression Brother   . Heart attack Brother 1  . Hypertension Neg Hx     Social History:  Social History   Social History  . Marital status: Married    Spouse name: N/A  . Number of children: 1  . Years of education: N/A   Occupational History  . RETIRED     [employer]   Social History Main Topics  . Smoking status: Former Smoker    Packs/day: 0.25    Years: 40.00    Types: Cigarettes  . Smokeless tobacco: Never Used     Comment: Quit 20 years ago  . Alcohol use 1.2 oz/week    2 Cans of beer per week  . Drug use: No  . Sexual activity: Yes  Partners: Female    Birth control/ protection: None   Other Topics Concern  . None   Social History Narrative   Retired [employer]    Allergies: No Known Allergies  Metabolic Disorder Labs: Lab Results  Component Value Date   HGBA1C 5.6 05/18/2015   MPG 114 05/18/2015   No results found for: PROLACTIN Lab Results  Component Value Date   CHOL 188 05/17/2015   TRIG 59 05/17/2015   HDL 49 05/17/2015   CHOLHDL 3.8 05/17/2015   VLDL 12 05/17/2015   LDLCALC 127 (H) 05/17/2015   [place] 122 08/30/2009   Lab Results  Component Value Date   TSH 1.750 05/17/2015   TSH 1.658 Test methodology is 3rd generation TSH 07/16/2007    Therapeutic Level Labs: No results found for: LITHIUM No results found for: VALPROATE No components found for:   CBMZ  Current Medications: Current Outpatient Prescriptions  Medication Sig Dispense Refill  . acetaminophen (TYLENOL) 325 MG tablet Take 2 tablets (650 mg total) by mouth every 4 (four) hours as needed for headache or mild pain.    Marland Kitchen ALPRAZolam (XANAX) 1 MG tablet Take 1 tablet (1 mg total) by mouth daily as needed for anxiety. 90 tablet 0  . aspirin EC 81 MG tablet Take 1 tablet (81 mg total) by mouth daily. 90 tablet 3  . atorvastatin (LIPITOR) 80 MG tablet Take 1 tablet (80 mg total) by mouth daily at 6 PM. 30 tablet 6  . buPROPion (WELLBUTRIN XL) 300 MG 24 hr tablet TAKE 1 TABLET(300 MG) BY MOUTH DAILY 90 tablet 0  . cholecalciferol (VITAMIN D) 1000 UNITS tablet Take 1,000 Units by mouth daily.    . cycloSPORINE (RESTASIS) 0.05 % ophthalmic emulsion Place 1 drop into both eyes 2 (two) times daily. For dry eyes per patient    . DULoxetine (CYMBALTA) 20 MG capsule Take 1 capsule (20 mg total) by mouth daily. 90 capsule 0  . finasteride (PROSCAR) 5 MG tablet Take 5 mg by mouth daily.    . metoprolol tartrate (LOPRESSOR) 25 MG tablet Take 1 tablet (25 mg total) by mouth 2 (two) times daily. 180 tablet 1  . nitroGLYCERIN (NITROSTAT) 0.4 MG SL tablet PLACE 1 TABLET UNDER THE TONGUE EVERY 5 MINUTES AS NEEDED FOR CHEST PAIN(OR SHORTNESS OF BREATH) 25 tablet 2  . ranitidine (ZANTAC) 150 MG tablet Take 150 mg by mouth every evening.     . tamsulosin (FLOMAX) 0.4 MG CAPS capsule Take 0.4 mg by mouth daily.      No current facility-administered medications for this visit.      Musculoskeletal: Strength & Muscle Tone: within normal limits Gait & Station: normal Patient leans: N/A  Psychiatric Specialty Exam: ROS  Blood pressure 126/74, pulse (!) 58, height 5\' 6"  (1.676 m), weight 189 lb 12.8 oz (86.1 kg).Body mass index is 30.63 kg/m.  General Appearance: Casual  Eye Contact:  Good  Speech:  Clear and Coherent  Volume:  Normal  Mood:  Euthymic  Affect:  Appropriate  Thought Process:   Goal Directed  Orientation:  Full (Time, Place, and Person)  Thought Content: Logical   Suicidal Thoughts:  No  Homicidal Thoughts:  No  Memory:  Immediate;   Good Recent;   Good Remote;   Good  Judgement:  Good  Insight:  Good  Psychomotor Activity:  Normal  Concentration:  Concentration: Good and Attention Span: Good  Recall:  Good  Fund of Knowledge: Good  Language: Good  Akathisia:  No  Handed:  Right  AIMS (if indicated): not done  Assets:  Communication Skills Desire for Improvement Housing Resilience Social Support  ADL's:  Intact  Cognition: WNL  Sleep:  Good   Screenings: PHQ2-9     CARDIAC REHAB PHASE II EXERCISE from 09/15/2015 in [facility] from 06/12/2015 in [facility]  PHQ-2 Total Score  0  0       Assessment and Plan: Major depressive disorder, recurrent.  Patient doing better on his current psychiatric medication.  I will continue Cymbalta 20 mg daily and Wellbutrin XL 300 mg daily.  He has no tremors shakes or any EPS.  Recommended to call us back if he has any question or any concern.  He does not need a new prescription for Xanax at this time.  Follow-up in 3 months.   Azelia Reiger T., MD 06/25/2017, 2:37 PM

## 2017-09-11 ENCOUNTER — Other Ambulatory Visit: Payer: Self-pay | Admitting: Family Medicine

## 2017-09-11 ENCOUNTER — Ambulatory Visit
Admission: RE | Admit: 2017-09-11 | Discharge: 2017-09-11 | Disposition: A | Discharge status: Home / Self Care | Payer: Medicare Other | Source: Ambulatory Visit | Attending: Family Medicine | Admitting: Family Medicine

## 2017-09-11 DIAGNOSIS — M79604 Pain in right leg: Secondary | ICD-10-CM

## 2017-09-11 DIAGNOSIS — M79605 Pain in left leg: Principal | ICD-10-CM

## 2017-09-25 ENCOUNTER — Encounter (HOSPITAL_COMMUNITY): Payer: Self-pay | Admitting: Psychiatry

## 2017-09-25 ENCOUNTER — Ambulatory Visit (INDEPENDENT_AMBULATORY_CARE_PROVIDER_SITE_OTHER): Payer: Medicare Other | Admitting: Psychiatry

## 2017-09-25 DIAGNOSIS — F330 Major depressive disorder, recurrent, mild: Secondary | ICD-10-CM

## 2017-09-25 DIAGNOSIS — Z810 Family history of intellectual disabilities: Secondary | ICD-10-CM | POA: Diagnosis not present

## 2017-09-25 DIAGNOSIS — Z87891 Personal history of nicotine dependence: Secondary | ICD-10-CM | POA: Diagnosis not present

## 2017-09-25 DIAGNOSIS — F419 Anxiety disorder, unspecified: Secondary | ICD-10-CM

## 2017-09-25 DIAGNOSIS — F1099 Alcohol use, unspecified with unspecified alcohol-induced disorder: Secondary | ICD-10-CM | POA: Diagnosis not present

## 2017-09-25 DIAGNOSIS — Z818 Family history of other mental and behavioral disorders: Secondary | ICD-10-CM

## 2017-09-25 MED ORDER — BUPROPION HCL ER (XL) 300 MG PO TB24: ORAL_TABLET | ORAL | 0 refills | Status: DC

## 2017-09-25 MED ORDER — DULOXETINE HCL 20 MG PO CPEP
20.0000 mg | ORAL_CAPSULE | Freq: Every day | ORAL | 0 refills | Status: DC
Start: 2017-09-25 — End: 2017-12-23

## 2017-09-25 NOTE — Progress Notes (Signed)
[place] MD/PA/NP OP Progress Note  09/25/2017 2:47 PM Randy Peterson  MRN:  [id number]  Chief Complaint: I had a good Christmas.  HPI: Randy Peterson came for his follow-up appointment.  He is compliant with his medication.  He had a good Christmas.  He is taking his medication as prescribed.  He did go to [place] to visit his family member on the Christmas.  He admitted some weight gain and he started [facility] and getting personal trainer for weight loss.  Recently seen his primary care physician and there has no changes.  He has not taken Xanax in a while but he has it just in case if needed.  He is pleased that no major panic attack.  He feel his current medicine is working very well.  He denies any irritability, crying spells, mania or any psychosis.  His sleep is good.  His energy level is good.  Patient denies drinking alcohol or using any illegal substances.  Visit Diagnosis:    ICD-10-CM   1. Major depressive disorder, recurrent episode, mild (HCC) F33.0 buPROPion (WELLBUTRIN XL) 300 MG 24 hr tablet    DULoxetine (CYMBALTA) 20 MG capsule    Past Psychiatric History: Reviewed. Patient has psychiatric inpatient in 2008 due to suicidal attempt when he cut his wrist.He was depressed due to his job and finances. In the past he had tried Paxil, Risperdal and he was also given Vyvanse to help his attention and focus. He had a good response with Cymbalta and Wellbutrin and he stopped these medication after feeling better but relapsed into severe depression.    Past Medical History:  Past Medical History:  Diagnosis Date  . Acid reflux   . Acute MI, inferior wall, initial episode of care ([place]) 05/17/15  . Angina decubitus ([place]) 05/16/15  . Anxiety   . CAD in native artery    per cath 05/16/14  . Depression   . Hyperlipidemia LDL goal <70   . Prostate enlargement   . S/P coronary artery stent placement, DES promus premier, RCA 05/17/15 05/18/2015    Past Surgical History:  Procedure Laterality Date  .  CARDIAC CATHETERIZATION N/A 05/17/2015   Procedure: Left Heart Cath and Coronary Angiography;  Surgeon: Sherren Mocha, MD;  Location: [facility];  Service: Cardiovascular;  Laterality: N/A;  . CARDIAC CATHETERIZATION N/A 05/17/2015   Procedure: Coronary Stent Intervention;  Surgeon: Sherren Mocha, MD;  Location: [facility];  Service: Cardiovascular;  Laterality: N/A;  . None      Family Psychiatric History: Viewed.  Family History:  Family History  Problem Relation Age of Onset  . Dementia Mother   . Stroke Mother   . Heart attack Father 6  . Suicidality Brother   . Depression Brother   . Heart attack Brother 75  . Hypertension Neg Hx     Social History:  Social History   Socioeconomic History  . Marital status: Married    Spouse name: Not on file  . Number of children: 1  . Years of education: Not on file  . Highest education level: Not on file  Social Needs  . Financial resource strain: Not on file  . Food insecurity - worry: Not on file  . Food insecurity - inability: Not on file  . Transportation needs - medical: Not on file  . Transportation needs - non-medical: Not on file  Occupational History  . Occupation: RETIRED    Comment: [employer]  Tobacco Use  . Smoking status:  Former Smoker    Packs/day: 0.25    Years: 40.00    Pack years: 10.00    Types: Cigarettes  . Smokeless tobacco: Never Used  . Tobacco comment: Quit 20 years ago  Substance and Sexual Activity  . Alcohol use: Yes    Alcohol/week: 1.2 oz    Types: 2 Cans of beer per week  . Drug use: No  . Sexual activity: Yes    Partners: Female    Birth control/protection: None  Other Topics Concern  . Not on file  Social History Narrative   Retired [employer]    Allergies: No Known Allergies  Metabolic Disorder Labs: Lab Results  Component Value Date   HGBA1C 5.6 05/18/2015   MPG 114 05/18/2015   No results found for: PROLACTIN Lab Results  Component Value Date   CHOL 188  05/17/2015   TRIG 59 05/17/2015   HDL 49 05/17/2015   CHOLHDL 3.8 05/17/2015   VLDL 12 05/17/2015   LDLCALC 127 (H) 05/17/2015   [place] 122 08/30/2009   Lab Results  Component Value Date   TSH 1.750 05/17/2015   TSH 1.658 Test methodology is 3rd generation TSH 07/16/2007    Therapeutic Level Labs: No results found for: LITHIUM No results found for: VALPROATE No components found for:  CBMZ  Current Medications: Current Outpatient Medications  Medication Sig Dispense Refill  . acetaminophen (TYLENOL) 325 MG tablet Take 2 tablets (650 mg total) by mouth every 4 (four) hours as needed for headache or mild pain.    Marland Kitchen ALPRAZolam (XANAX) 1 MG tablet Take 1 tablet (1 mg total) by mouth daily as needed for anxiety. 90 tablet 0  . aspirin EC 81 MG tablet Take 1 tablet (81 mg total) by mouth daily. 90 tablet 3  . atorvastatin (LIPITOR) 80 MG tablet Take 1 tablet (80 mg total) by mouth daily at 6 PM. 30 tablet 6  . buPROPion (WELLBUTRIN XL) 300 MG 24 hr tablet TAKE 1 TABLET(300 MG) BY MOUTH DAILY 90 tablet 0  . cholecalciferol (VITAMIN D) 1000 UNITS tablet Take 1,000 Units by mouth daily.    . cycloSPORINE (RESTASIS) 0.05 % ophthalmic emulsion Place 1 drop into both eyes 2 (two) times daily. For dry eyes per patient    . DULoxetine (CYMBALTA) 20 MG capsule Take 1 capsule (20 mg total) by mouth daily. 90 capsule 0  . finasteride (PROSCAR) 5 MG tablet Take 5 mg by mouth daily.    . metoprolol tartrate (LOPRESSOR) 25 MG tablet Take 1 tablet (25 mg total) by mouth 2 (two) times daily. 180 tablet 1  . nitroGLYCERIN (NITROSTAT) 0.4 MG SL tablet PLACE 1 TABLET UNDER THE TONGUE EVERY 5 MINUTES AS NEEDED FOR CHEST PAIN(OR SHORTNESS OF BREATH) 25 tablet 2  . ranitidine (ZANTAC) 150 MG tablet Take 150 mg by mouth every evening.     . tamsulosin (FLOMAX) 0.4 MG CAPS capsule Take 0.4 mg by mouth daily.      No current facility-administered medications for this visit.      Musculoskeletal: Strength &  Muscle Tone: within normal limits Gait & Station: normal Patient leans: N/A  Psychiatric Specialty Exam: ROS  Blood pressure 128/74, pulse 70, height 5\' 6"  (1.676 m), weight 198 lb (89.8 kg).Body mass index is 31.96 kg/m.  General Appearance: Casual  Eye Contact:  Good  Speech:  Clear and Coherent  Volume:  Normal  Mood:  Euthymic  Affect:  Appropriate  Thought Process:  Goal Directed  Orientation:  Full (  Time, Place, and Person)  Thought Content: Logical   Suicidal Thoughts:  No  Homicidal Thoughts:  No  Memory:  Immediate;   Good Recent;   Good Remote;   Good  Judgement:  Good  Insight:  Good  Psychomotor Activity:  Normal  Concentration:  Concentration: Good and Attention Span: Good  Recall:  Good  Fund of Knowledge: Good  Language: Good  Akathisia:  No  Handed:  Right  AIMS (if indicated): not done  Assets:  Communication Skills Desire for Improvement Housing Resilience Social Support  ADL's:  Intact  Cognition: WNL  Sleep:  Good   Screenings: PHQ2-9     CARDIAC REHAB PHASE II EXERCISE from 09/15/2015 in [facility] from 06/12/2015 in [place]  PHQ-2 Total Score  0  0       Assessment and Plan: Anxiety disorder NOS.  Major depressive disorder.  Patient doing very well on his current psychiatric medication.  He does not need a new prescription of Xanax today.  He has some leftover.  Continue Cymbalta 20 mg daily and Wellbutrin XL 300 mg daily.  He has no tremors, shakes, EPS or any side effects.  Encouraged to continue healthy lifestyle.  Patient is going to [facility] every day and getting trained with personal trainer.  Recommended to call us back if is any question or any concern.  Follow-up in 3 months.   Kathlee Nations, MD 09/25/2017, 2:47 PM

## 2017-12-14 NOTE — Progress Notes (Signed)
Cardiology Office Note   Date:  12/15/2017   ID:  Randy Peterson, DOB 18-Feb-1946, MRN [id number]  PCP:  Derinda Late, MD  Cardiologist:   Minus Breeding, MD   Chief Complaint  Patient presents with  . Coronary Artery Disease      History of Present Illness: Randy Peterson is a 72 y.o. male who presents for follow-up of inferior myocardial infarction. 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  Since I last saw him he has done well.  The patient denies any new symptoms such as chest discomfort, neck or arm discomfort. There has been no new shortness of breath, PND or orthopnea. There have been no reported palpitations, presyncope or syncope.  He is exercising routinely.  He denies any symptoms with this.    Past Medical History:  Diagnosis Date  . Acid reflux   . Acute MI, inferior wall, initial episode of care ([place]) 05/17/15  . Angina decubitus ([place]) 05/16/15  . Anxiety   . CAD in native artery    per cath 05/16/14  . Depression   . Hyperlipidemia LDL goal <70   . Prostate enlargement   . S/P coronary artery stent placement, DES promus premier, RCA 05/17/15 05/18/2015    Past Surgical History:  Procedure Laterality Date  . CARDIAC CATHETERIZATION N/A 05/17/2015   Procedure: Left Heart Cath and Coronary Angiography;  Surgeon: Sherren Mocha, MD;  Location: [facility];  Service: Cardiovascular;  Laterality: N/A;  . CARDIAC CATHETERIZATION N/A 05/17/2015   Procedure: Coronary Stent Intervention;  Surgeon: Sherren Mocha, MD;  Location: [facility];  Service: Cardiovascular;  Laterality: N/A;  . None       Current Outpatient Medications  Medication Sig Dispense Refill  . acetaminophen (TYLENOL) 325 MG tablet Take 2 tablets (650 mg total) by mouth every 4 (four) hours as needed for headache or mild pain.    Marland Kitchen ALPRAZolam (XANAX) 1 MG tablet Take 1 tablet (1 mg total) by mouth daily as needed for anxiety. 90 tablet 0  . aspirin EC  81 MG tablet Take 1 tablet (81 mg total) by mouth daily. 90 tablet 3  . atorvastatin (LIPITOR) 80 MG tablet Take 1 tablet (80 mg total) by mouth daily at 6 PM. 30 tablet 6  . buPROPion (WELLBUTRIN XL) 300 MG 24 hr tablet TAKE 1 TABLET(300 MG) BY MOUTH DAILY 90 tablet 0  . cholecalciferol (VITAMIN D) 1000 UNITS tablet Take 1,000 Units by mouth daily.    . cycloSPORINE (RESTASIS) 0.05 % ophthalmic emulsion Place 1 drop into both eyes 2 (two) times daily. For dry eyes per patient    . DULoxetine (CYMBALTA) 20 MG capsule Take 1 capsule (20 mg total) by mouth daily. 90 capsule 0  . finasteride (PROSCAR) 5 MG tablet Take 5 mg by mouth daily.    . metoprolol tartrate (LOPRESSOR) 25 MG tablet Take 1 tablet (25 mg total) by mouth 2 (two) times daily. 180 tablet 1  . nitroGLYCERIN (NITROSTAT) 0.4 MG SL tablet Place 1 tablet (0.4 mg total) under the tongue every 5 (five) minutes as needed for chest pain. 25 tablet 2  . ranitidine (ZANTAC) 150 MG tablet Take 150 mg by mouth every evening.     . tamsulosin (FLOMAX) 0.4 MG CAPS capsule Take 0.4 mg by mouth daily.      No current facility-administered medications for this visit.     Allergies:   Patient has no known allergies.  ROS:  Please see the history of present illness.   Otherwise, review of systems are positive for none.   All other systems are reviewed and negative.    PHYSICAL EXAM: VS:  BP 111/70   Pulse (!) 55   Ht 5\' 6"  (1.676 m)   Wt 198 lb 3.2 oz (89.9 kg)   BMI 31.99 kg/m  , BMI Body mass index is 31.99 kg/m.  GENERAL:  Well appearing NECK:  No jugular venous distention, waveform within normal limits, carotid upstroke brisk and symmetric, no bruits, no thyromegaly LUNGS:  Clear to auscultation bilaterally CHEST:  Unremarkable HEART:  PMI not displaced or sustained,S1 and S2 within normal limits, no S3, no S4, no clicks, no rubs, no murmurs ABD:  Flat, positive bowel sounds normal in frequency in pitch, no bruits, no rebound, no  guarding, no midline pulsatile mass, no hepatomegaly, no splenomegaly EXT:  2 plus pulses throughout, no edema, no cyanosis no clubbing   EKG:  EKG is  ordered today. Sinus rhythm, rate 55, axis within normal limits, intervals within normal limits, no acute ST-T wave changes.  Recent Labs: No results found for requested labs within last 8760 hours.     Wt Readings from Last 3 Encounters:  12/15/17 198 lb 3.2 oz (89.9 kg)  01/28/17 186 lb (84.4 kg)  07/29/16 186 lb (84.4 kg)      Other studies Reviewed: Additional studies/ records that were reviewed today include:  None Review of the above records demonstrates:   None   ASSESSMENT AND PLAN:  CAD:  The patient has no new sypmtoms.  No further cardiovascular testing is indicated.  We will continue with aggressive risk reduction and meds as listed.  DYSLIPIDEMIA:     This is followed by Derinda Late, MD.  I do not see recent labs but he understands that the goal is an LDL less than 70.  Current medicines are reviewed at length with the patient today.  The patient does not have concerns regarding medicines.  The following changes have been made:   None  Labs/ tests ordered today include: None  Orders Placed This Encounter  Procedures  . EKG 12-Lead     Disposition:   FU with 12 months.      Signed, Minus Breeding, MD  12/15/2017 10:36 AM    [facility]

## 2017-12-15 ENCOUNTER — Ambulatory Visit: Payer: Medicare Other | Admitting: Cardiology

## 2017-12-15 ENCOUNTER — Encounter: Payer: Self-pay | Admitting: Cardiology

## 2017-12-15 VITALS — BP 111/70 | HR 55 | Ht 66.0 in | Wt 198.2 lb

## 2017-12-15 DIAGNOSIS — I2510 Atherosclerotic heart disease of native coronary artery without angina pectoris: Secondary | ICD-10-CM

## 2017-12-15 DIAGNOSIS — E785 Hyperlipidemia, unspecified: Secondary | ICD-10-CM | POA: Diagnosis not present

## 2017-12-15 MED ORDER — NITROGLYCERIN 0.4 MG SL SUBL: 0.4000 mg | SUBLINGUAL_TABLET | SUBLINGUAL | 2 refills | Status: DC | PRN

## 2017-12-15 NOTE — Patient Instructions (Signed)
Medication Instructions:  Continue current medications  If you need a refill on your cardiac medications before your next appointment, please call your pharmacy.  Labwork: None Ordered   Testing/Procedures: None Ordered  Follow-Up: Your physician wants you to follow-up in: 1 Year. You should receive a reminder letter in the mail two months in advance. If you do not receive a letter, please call our office [phone].    Thank you for choosing [facility]!!      

## 2017-12-23 ENCOUNTER — Encounter (HOSPITAL_COMMUNITY): Payer: Self-pay | Admitting: Psychiatry

## 2017-12-23 ENCOUNTER — Ambulatory Visit (INDEPENDENT_AMBULATORY_CARE_PROVIDER_SITE_OTHER): Payer: Medicare Other | Admitting: Psychiatry

## 2017-12-23 DIAGNOSIS — F411 Generalized anxiety disorder: Secondary | ICD-10-CM

## 2017-12-23 DIAGNOSIS — Z810 Family history of intellectual disabilities: Secondary | ICD-10-CM

## 2017-12-23 DIAGNOSIS — Z87891 Personal history of nicotine dependence: Secondary | ICD-10-CM

## 2017-12-23 DIAGNOSIS — F330 Major depressive disorder, recurrent, mild: Secondary | ICD-10-CM | POA: Diagnosis not present

## 2017-12-23 MED ORDER — BUPROPION HCL ER (XL) 300 MG PO TB24: ORAL_TABLET | ORAL | 0 refills | Status: DC

## 2017-12-23 MED ORDER — ALPRAZOLAM 0.5 MG PO TABS: 0.5000 mg | ORAL_TABLET | Freq: Every day | ORAL | 0 refills | Status: DC | PRN

## 2017-12-23 MED ORDER — DULOXETINE HCL 20 MG PO CPEP: 20.0000 mg | ORAL_CAPSULE | Freq: Every day | ORAL | 0 refills | Status: DC

## 2017-12-23 NOTE — Progress Notes (Signed)
[facility] MD/PA/NP OP Progress Note  12/23/2017 3:51 PM Randy Peterson  MRN:  [id number]  Chief Complaint: I am doing good.  I am sleeping good.  I am going to [place] in May with my wife.  HPI: Patient came for his follow-up appointment.  He is compliant with medication and denies any side effects.  He denies any major panic attack or anxiety attack.  He is excited because he is going to [place] cruise trip with his wife in May.  His sleep is good.  He denies any crying spells or any feeling of hopelessness or worthlessness.  He has not taken Xanax in a while but he need a new prescription because his old prescription is expired.  He is going to [facility] and continued to do regular exercise.  Patient has no tremors, shakes or any EPS.  His energy level is good.  He denies drinking alcohol or using any illegal substances.  Visit Diagnosis:    ICD-10-CM   1. Major depressive disorder, recurrent episode, mild (HCC) F33.0 DULoxetine (CYMBALTA) 20 MG capsule    buPROPion (WELLBUTRIN XL) 300 MG 24 hr tablet  2. Generalized anxiety disorder F41.1 ALPRAZolam (XANAX) 0.5 MG tablet    Past Psychiatric History: Reviewed. Patient has psychiatric inpatient in 2008 due to suicidal attemptwhen he cut his wrist.He was depressed due to his job and finances. In the past he had tried Paxil, Risperdal and he was also given Vyvanse to help his attention and focus. He had a good response with Cymbalta and Wellbutrin and he stopped these medication after feeling better but relapsed into severe depression.  Past Medical History:  Past Medical History:  Diagnosis Date  . Acid reflux   . Acute MI, inferior wall, initial episode of care ([place]) 05/17/15  . Angina decubitus ([place]) 05/16/15  . Anxiety   . CAD in native artery    per cath 05/16/14  . Depression   . Hyperlipidemia LDL goal <70   . Prostate enlargement   . S/P coronary artery stent placement, DES promus premier, RCA 05/17/15 05/18/2015    Past Surgical History:   Procedure Laterality Date  . CARDIAC CATHETERIZATION N/A 05/17/2015   Procedure: Left Heart Cath and Coronary Angiography;  Surgeon: Sherren Mocha, MD;  Location: [facility];  Service: Cardiovascular;  Laterality: N/A;  . CARDIAC CATHETERIZATION N/A 05/17/2015   Procedure: Coronary Stent Intervention;  Surgeon: Sherren Mocha, MD;  Location: [facility];  Service: Cardiovascular;  Laterality: N/A;  . None      Family Psychiatric History: Reviewed.  Family History:  Family History  Problem Relation Age of Onset  . Dementia Mother   . Stroke Mother   . Heart attack Father 55  . Suicidality Brother   . Depression Brother   . Heart attack Brother 47  . Hypertension Neg Hx     Social History:  Social History   Socioeconomic History  . Marital status: Married    Spouse name: Not on file  . Number of children: 1  . Years of education: Not on file  . Highest education level: Not on file  Occupational History  . Occupation: RETIRED    Comment: [employer]  Social Needs  . Financial resource strain: Not on file  . Food insecurity:    Worry: Not on file    Inability: Not on file  . Transportation needs:    Medical: Not on file    Non-medical: Not on file  Tobacco Use  .  Smoking status: Former Smoker    Packs/day: 0.25    Years: 40.00    Pack years: 10.00    Types: Cigarettes  . Smokeless tobacco: Never Used  . Tobacco comment: Quit 20 years ago  Substance and Sexual Activity  . Alcohol use: Yes    Alcohol/week: 1.2 oz    Types: 2 Cans of beer per week  . Drug use: No  . Sexual activity: Yes    Partners: Female    Birth control/protection: None  Lifestyle  . Physical activity:    Days per week: Not on file    Minutes per session: Not on file  . Stress: Not on file  Relationships  . Social connections:    Talks on phone: Not on file    Gets together: Not on file    Attends religious service: Not on file    Active member of club or  organization: Not on file    Attends meetings of clubs or organizations: Not on file    Relationship status: Not on file  Other Topics Concern  . Not on file  Social History Narrative   Retired [employer]    Allergies: No Known Allergies  Metabolic Disorder Labs: Lab Results  Component Value Date   HGBA1C 5.6 05/18/2015   MPG 114 05/18/2015   No results found for: PROLACTIN Lab Results  Component Value Date   CHOL 188 05/17/2015   TRIG 59 05/17/2015   HDL 49 05/17/2015   CHOLHDL 3.8 05/17/2015   VLDL 12 05/17/2015   LDLCALC 127 (H) 05/17/2015   [place] 122 08/30/2009   Lab Results  Component Value Date   TSH 1.750 05/17/2015   TSH 1.658 Test methodology is 3rd generation TSH 07/16/2007    Therapeutic Level Labs: No results found for: LITHIUM No results found for: VALPROATE No components found for:  CBMZ  Current Medications: Current Outpatient Medications  Medication Sig Dispense Refill  . acetaminophen (TYLENOL) 325 MG tablet Take 2 tablets (650 mg total) by mouth every 4 (four) hours as needed for headache or mild pain.    Marland Kitchen ALPRAZolam (XANAX) 1 MG tablet Take 1 tablet (1 mg total) by mouth daily as needed for anxiety. 90 tablet 0  . aspirin EC 81 MG tablet Take 1 tablet (81 mg total) by mouth daily. 90 tablet 3  . atorvastatin (LIPITOR) 80 MG tablet Take 1 tablet (80 mg total) by mouth daily at 6 PM. 30 tablet 6  . buPROPion (WELLBUTRIN XL) 300 MG 24 hr tablet TAKE 1 TABLET(300 MG) BY MOUTH DAILY 90 tablet 0  . cholecalciferol (VITAMIN D) 1000 UNITS tablet Take 1,000 Units by mouth daily.    . cycloSPORINE (RESTASIS) 0.05 % ophthalmic emulsion Place 1 drop into both eyes 2 (two) times daily. For dry eyes per patient    . DULoxetine (CYMBALTA) 20 MG capsule Take 1 capsule (20 mg total) by mouth daily. 90 capsule 0  . finasteride (PROSCAR) 5 MG tablet Take 5 mg by mouth daily.    . metoprolol tartrate (LOPRESSOR) 25 MG tablet Take 1 tablet (25 mg total) by mouth 2 (two)  times daily. 180 tablet 1  . nitroGLYCERIN (NITROSTAT) 0.4 MG SL tablet Place 1 tablet (0.4 mg total) under the tongue every 5 (five) minutes as needed for chest pain. 25 tablet 2  . ranitidine (ZANTAC) 150 MG tablet Take 150 mg by mouth every evening.     . tamsulosin (FLOMAX) 0.4 MG CAPS capsule Take 0.4 mg by  mouth daily.      No current facility-administered medications for this visit.      Musculoskeletal: Strength & Muscle Tone: within normal limits Gait & Station: normal Patient leans: N/A  Psychiatric Specialty Exam: ROS  Blood pressure 122/74, pulse 64, height 5\' 6"  (1.676 m), weight 199 lb (90.3 kg), SpO2 99 %.There is no height or weight on file to calculate BMI.  General Appearance: Casual  Eye Contact:  Good  Speech:  Clear and Coherent  Volume:  Normal  Mood:  Euthymic  Affect:  Appropriate  Thought Process:  Goal Directed  Orientation:  Full (Time, Place, and Person)  Thought Content: Logical   Suicidal Thoughts:  No  Homicidal Thoughts:  No  Memory:  Immediate;   Good Recent;   Good Remote;   Good  Judgement:  Good  Insight:  Good  Psychomotor Activity:  Normal  Concentration:  Concentration: Good and Attention Span: Good  Recall:  Good  Fund of Knowledge: Good  Language: Good  Akathisia:  No  Handed:  Right  AIMS (if indicated): not done  Assets:  Communication Skills Desire for Improvement Housing Resilience Social Support  ADL's:  Intact  Cognition: WNL  Sleep:  Good   Screenings: PHQ2-9     CARDIAC REHAB PHASE II EXERCISE from 09/15/2015 in [place] from 06/12/2015 in [facility]  PHQ-2 Total Score  0  0       Assessment and Plan: Major depressive disorder, recurrent.  Anxiety disorder NOS.  Patient is a stable on his current medication.  Continue Cymbalta 20 mg daily and Wellbutrin XL 300 mg daily.  We will provide a new prescription since  old prescription of Xanax expired.  Xanax 0.5 mg as needed for severe anxiety and panic attack.  Encouraged to continue [employer] every day with personal trainer and exercise.  Recommended to call us back if is any question or any concern.  Follow-up in 3 months.   Kathlee Nations, MD 12/23/2017, 3:51 PM

## 2018-03-24 ENCOUNTER — Ambulatory Visit (HOSPITAL_COMMUNITY): Payer: Self-pay | Admitting: Psychiatry

## 2018-03-30 ENCOUNTER — Other Ambulatory Visit (HOSPITAL_COMMUNITY): Payer: Self-pay | Admitting: Psychiatry

## 2018-03-30 DIAGNOSIS — F330 Major depressive disorder, recurrent, mild: Secondary | ICD-10-CM

## 2018-04-02 ENCOUNTER — Other Ambulatory Visit (HOSPITAL_COMMUNITY): Payer: Self-pay

## 2018-04-02 DIAGNOSIS — F330 Major depressive disorder, recurrent, mild: Secondary | ICD-10-CM

## 2018-04-02 MED ORDER — BUPROPION HCL ER (XL) 300 MG PO TB24: ORAL_TABLET | ORAL | 0 refills | Status: DC

## 2018-04-06 ENCOUNTER — Other Ambulatory Visit (HOSPITAL_COMMUNITY): Payer: Self-pay

## 2018-04-06 DIAGNOSIS — F330 Major depressive disorder, recurrent, mild: Secondary | ICD-10-CM

## 2018-04-06 MED ORDER — DULOXETINE HCL 20 MG PO CPEP: 20.0000 mg | ORAL_CAPSULE | Freq: Every day | ORAL | 0 refills | Status: DC

## 2018-05-05 ENCOUNTER — Encounter (HOSPITAL_COMMUNITY): Payer: Self-pay | Admitting: Psychiatry

## 2018-05-05 ENCOUNTER — Ambulatory Visit (HOSPITAL_COMMUNITY): Payer: No Typology Code available for payment source | Admitting: Psychiatry

## 2018-05-05 VITALS — BP 97/65 | HR 74 | Ht 66.0 in | Wt 198.0 lb

## 2018-05-05 DIAGNOSIS — F330 Major depressive disorder, recurrent, mild: Secondary | ICD-10-CM

## 2018-05-05 DIAGNOSIS — Z79899 Other long term (current) drug therapy: Secondary | ICD-10-CM | POA: Diagnosis not present

## 2018-05-05 DIAGNOSIS — F411 Generalized anxiety disorder: Secondary | ICD-10-CM | POA: Diagnosis not present

## 2018-05-05 MED ORDER — DULOXETINE HCL 30 MG PO CPEP: 30.0000 mg | ORAL_CAPSULE | Freq: Every day | ORAL | 0 refills | Status: DC

## 2018-05-05 NOTE — Patient Instructions (Addendum)
Taper off of Wellbutrin   Increase Cymbalta to 30 mg daily with plan to transition to single agent.

## 2018-05-05 NOTE — Progress Notes (Signed)
BH MD/PA/NP OP Progress Note  05/05/2018 4:32 PM Randy Peterson  MRN:  [id number]  Chief Complaint: I am doing good.  I am enjoying my summer.   HPI: Patient came for his follow-up appointment.  He is compliant with medication and denies any side effects.  He denies any major panic attack or anxiety attack.  He had a great trip on his [place] cruise with his wife in May.  His sleep is good.  He denies any crying spells or any feeling of hopelessness or worthlessness.  He has not taken Xanax in a while but he need a new prescription because his old prescription is expired.  He is going to [facility] and continued to do regular exercise, and plans to restart with a personal trainer after significant weight gain from vacations.  Patient has no tremors, shakes or any EPS.  His energy level is good.  He denies drinking alcohol or using any illegal substances.  Patient denies any cardio-pulmonary symptoms.  He does experience some musculoskeletal aches and pains.  He would like to consider minimizing medications if possible.   Visit Diagnosis:    ICD-10-CM   1. Major depressive disorder, recurrent episode, mild (HCC) F33.0 DULoxetine (CYMBALTA) 30 MG capsule  2. Generalized anxiety disorder F41.1     Past Psychiatric History: Reviewed. Patient has psychiatric inpatient in 2008 due to suicidal attemptwhen he cut his wrist.He was depressed due to his job and finances. In the past he had tried Paxil, Risperdal and he was also given Vyvanse to help his attention and focus. He had a good response with Cymbalta and Wellbutrin and he stopped these medication after feeling better but relapsed into severe depression.  Past Medical History:  Past Medical History:  Diagnosis Date  . Acid reflux   . Acute MI, inferior wall, initial episode of care ([place]) 05/17/15  . Angina decubitus ([place]) 05/16/15  . Anxiety   . CAD in native artery    per cath 05/16/14  . Depression   . Hyperlipidemia LDL goal <70   .  Prostate enlargement   . S/P coronary artery stent placement, DES promus premier, RCA 05/17/15 05/18/2015    Past Surgical History:  Procedure Laterality Date  . CARDIAC CATHETERIZATION N/A 05/17/2015   Procedure: Left Heart Cath and Coronary Angiography;  Surgeon: Sherren Mocha, MD;  Location: [facility];  Service: Cardiovascular;  Laterality: N/A;  . CARDIAC CATHETERIZATION N/A 05/17/2015   Procedure: Coronary Stent Intervention;  Surgeon: Sherren Mocha, MD;  Location: [facility];  Service: Cardiovascular;  Laterality: N/A;  . None      Family Psychiatric History: Reviewed.  Family History:  Family History  Problem Relation Age of Onset  . Dementia Mother   . Stroke Mother   . Heart attack Father 41  . Suicidality Brother   . Depression Brother   . Heart attack Brother 59  . Hypertension Neg Hx     Social History:  Social History   Socioeconomic History  . Marital status: Married    Spouse name: Not on file  . Number of children: 1  . Years of education: Not on file  . Highest education level: Not on file  Occupational History  . Occupation: RETIRED    Comment: [employer]  Social Needs  . Financial resource strain: Not on file  . Food insecurity:    Worry: Not on file    Inability: Not on file  . Transportation needs:  Medical: Not on file    Non-medical: Not on file  Tobacco Use  . Smoking status: Former Smoker    Packs/day: 0.25    Years: 40.00    Pack years: 10.00    Types: Cigarettes  . Smokeless tobacco: Never Used  . Tobacco comment: Quit 20 years ago  Substance and Sexual Activity  . Alcohol use: Yes    Alcohol/week: 2.0 standard drinks    Types: 2 Cans of beer per week  . Drug use: No  . Sexual activity: Yes    Partners: Female    Birth control/protection: None  Lifestyle  . Physical activity:    Days per week: Not on file    Minutes per session: Not on file  . Stress: Not on file  Relationships  . Social connections:     Talks on phone: Not on file    Gets together: Not on file    Attends religious service: Not on file    Active member of club or organization: Not on file    Attends meetings of clubs or organizations: Not on file    Relationship status: Not on file  Other Topics Concern  . Not on file  Social History Narrative   Retired Investment banker, corporate   Lives with wife and daughter, 32 years visit often.   Allergies: No Known Allergies  Metabolic Disorder Labs: Lab Results  Component Value Date   HGBA1C 5.6 05/18/2015   MPG 114 05/18/2015   No results found for: PROLACTIN Lab Results  Component Value Date   CHOL 188 05/17/2015   TRIG 59 05/17/2015   HDL 49 05/17/2015   CHOLHDL 3.8 05/17/2015   VLDL 12 05/17/2015   LDLCALC 127 (H) 05/17/2015   LDLCALC 122 08/30/2009   Lab Results  Component Value Date   TSH 1.750 05/17/2015   TSH 1.658 Test methodology is 3rd generation TSH 07/16/2007    Therapeutic Level Labs: No results found for: LITHIUM No results found for: VALPROATE No components found for:  CBMZ  Current Medications: Current Outpatient Medications  Medication Sig Dispense Refill  . acetaminophen (TYLENOL) 325 MG tablet Take 2 tablets (650 mg total) by mouth every 4 (four) hours as needed for headache or mild pain.    Marland Kitchen ALPRAZolam (XANAX) 0.5 MG tablet Take 1 tablet (0.5 mg total) by mouth daily as needed for anxiety. 30 tablet 0  . aspirin EC 81 MG tablet Take 1 tablet (81 mg total) by mouth daily. 90 tablet 3  . atorvastatin (LIPITOR) 80 MG tablet Take 1 tablet (80 mg total) by mouth daily at 6 PM. 30 tablet 6  . buPROPion (WELLBUTRIN XL) 300 MG 24 hr tablet TAKE 1 TABLET(300 MG) BY MOUTH DAILY 90 tablet 0  . cholecalciferol (VITAMIN D) 1000 UNITS tablet Take 1,000 Units by mouth daily.    . cycloSPORINE (RESTASIS) 0.05 % ophthalmic emulsion Place 1 drop into both eyes 2 (two) times daily. For dry eyes per patient    . DULoxetine (CYMBALTA) 20 MG capsule Take 1 capsule (20 mg  total) by mouth daily. 30 capsule 0  . finasteride (PROSCAR) 5 MG tablet Take 5 mg by mouth daily.    . metoprolol tartrate (LOPRESSOR) 25 MG tablet Take 1 tablet (25 mg total) by mouth 2 (two) times daily. 180 tablet 1  . nitroGLYCERIN (NITROSTAT) 0.4 MG SL tablet Place 1 tablet (0.4 mg total) under the tongue every 5 (five) minutes as needed for chest pain. 25 tablet 2  .  ranitidine (ZANTAC) 150 MG tablet Take 150 mg by mouth every evening.     . tamsulosin (FLOMAX) 0.4 MG CAPS capsule Take 0.4 mg by mouth daily.      No current facility-administered medications for this visit.      Musculoskeletal: Strength & Muscle Tone: within normal limits Gait & Station: normal Patient leans: N/A  Psychiatric Specialty Exam: Review of Systems  Constitutional: Negative.   Respiratory: Negative.   Cardiovascular: Negative.   Gastrointestinal: Negative.   Genitourinary: Negative.   Musculoskeletal: Positive for myalgias.  Neurological: Negative.     Blood pressure 97/65, pulse 74, height 5\' 6"  (1.676 m), weight 198 lb (89.8 kg), SpO2 98 %.Body mass index is 31.96 kg/m.  General Appearance: Casual  Eye Contact:  Good  Speech:  Clear and Coherent  Volume:  Normal  Mood:  Euthymic  Affect:  Appropriate  Thought Process:  Goal Directed  Orientation:  Full (Time, Place, and Person)  Thought Content: Logical   Suicidal Thoughts:  No  Homicidal Thoughts:  No  Memory:  Immediate;   Good Recent;   Good Remote;   Good  Judgement:  Good  Insight:  Good  Psychomotor Activity:  Normal  Concentration:  Concentration: Good and Attention Span: Good  Recall:  Good  Fund of Knowledge: Good  Language: Good  Akathisia:  No  Handed:  Right  AIMS (if indicated): not done  Assets:  Communication Skills Desire for Improvement Housing Resilience Social Support  ADL's:  Intact  Cognition: WNL  Sleep:  Good   Screenings: PHQ2-9     CARDIAC REHAB PHASE II EXERCISE from 09/15/2015 in [facility] from 06/12/2015 in [place]  PHQ-2 Total Score  0  0       Assessment and Plan: Major depressive disorder, recurrent.  Anxiety disorder NOS.  Patient is a stable on his current medication, but would like to consider decreasing amount of medications he takes. He has found Cymbalta to be of benefit, and would like to consider discontinuation of Wellbutrin, which is advised given his cardiac history.  He specifically denies suicidal ideation, intent or plan and contracts for safety and will discuss medication changes with his wife  He does not need refill of Xanax.  Patient Instructions  Taper off of Wellbutrin   Increase Cymbalta to 30 mg daily with plan to transition to single agent.      Encouraged to continue [employer] every day with personal trainer and exercise.  Recommended to call us back if is any question or any concern.   Time spent 25 minutes.  More than 50% of the time spent in medication education, psychoeducation, counseling and coordination of care.  Discuss safety plan that anytime having active suicidal thoughts or homicidal thoughts then patient need to call 911 or go to the local emergency room.   Follow-up in 3 months.   Lavella Hammock, MD 05/05/2018, 4:32 PM

## 2018-07-08 ENCOUNTER — Ambulatory Visit (INDEPENDENT_AMBULATORY_CARE_PROVIDER_SITE_OTHER): Payer: Medicare Other | Admitting: Psychiatry

## 2018-07-08 ENCOUNTER — Telehealth: Payer: Self-pay | Admitting: Cardiology

## 2018-07-08 ENCOUNTER — Encounter (HOSPITAL_COMMUNITY): Payer: Self-pay | Admitting: Psychiatry

## 2018-07-08 VITALS — BP 154/82 | HR 74 | Ht 66.0 in | Wt 201.0 lb

## 2018-07-08 DIAGNOSIS — F330 Major depressive disorder, recurrent, mild: Secondary | ICD-10-CM

## 2018-07-08 DIAGNOSIS — F411 Generalized anxiety disorder: Secondary | ICD-10-CM

## 2018-07-08 MED ORDER — BUPROPION HCL ER (XL) 150 MG PO TB24: 150.0000 mg | ORAL_TABLET | Freq: Every day | ORAL | 0 refills | Status: DC

## 2018-07-08 MED ORDER — DULOXETINE HCL 20 MG PO CPEP: 20.0000 mg | ORAL_CAPSULE | Freq: Every day | ORAL | 0 refills | Status: DC

## 2018-07-08 NOTE — Telephone Encounter (Signed)
New Message         STAT if patient feels like he/she is going to faint   1) Are you dizzy now? Yes  2) Do you feel faint or have you passed out? No   3) Do you have any other symptoms? Slight pain in the heart, per patient  It goes away  4) Have you checked your HR and BP (record if available)? No

## 2018-07-08 NOTE — Telephone Encounter (Signed)
Patient complaining of lightheadedness and SOB at times. Patient stated he had a panic attack yesterday with slight chest pain that went away. Patient stated he had no reason for anxiety. Patient's BP 155/81 and HR 76 right now. Patient had recent medication changes with Wellbutrin stopped and Cymbalta increased to 30 mg. Patient stated he is seeing Dr. Leverne Humbles today, that maybe his symptoms are related to his medications. Made patient an appointment with PA tomorrow to be evaluated, since patient has a history of CAD. Encouraged patient to use his nitro next time he has chest pain, and if his symptoms get worse to go the ED. Patient agreed to plan and verbalized understanding. Will forward to Dr. Percival Spanish for further advisement.

## 2018-07-08 NOTE — Progress Notes (Signed)
[facility] MD/PA/NP OP Progress Note  07/08/2018 3:02 PM Randy Peterson  MRN:  [id number]  Chief Complaint: I am having sweating and some time mild chest pain since Cymbalta does increase.  I feel more anxious.  I took Xanax after many many months and that calmed down.  HPI: Randy Peterson came for his appointment.  He was last seen by covering physician Dr. Berenda Morale who is continue his Wellbutrin and recommended a trial Cymbalta 30 mg in August 2019.  Since then he has been complaining of more anxiety, sweating when he goes outside and mild chest pain on and off.  He does not believe he has a heart attack symptoms because he does not feel heaviness or severe chest pain.  Patient has history of MI and he recall cardiac symptoms very well.  He also gained 3 pounds since the medicine change.  He also took Xanax recently because of anxiety which he has not done more than 8 months.  He denies any depression, crying spells, feeling of hopelessness or worthlessness.  He actually had a very good past few months because he visited [place] and then went to [place] and recently to [employer].  Otherwise his living situation is better and he feel no new stressors in his life.  He called his cardiologist Dr. Archie Patten yesterday and he is going to see him today.  His energy level is good.  He denies drinking or using any illegal substances.  Visit Diagnosis:    ICD-10-CM   1. Major depressive disorder, recurrent episode, mild (HCC) F33.0 DULoxetine (CYMBALTA) 20 MG capsule    buPROPion (WELLBUTRIN XL) 150 MG 24 hr tablet  2. Generalized anxiety disorder F41.1 buPROPion (WELLBUTRIN XL) 150 MG 24 hr tablet    Past Psychiatric History: Reviewed. Patient has psychiatric inpatient in 2008 due to suicidal attemptwhen he cut his wrist.He was depressed due to his job and finances. In the past he had tried Paxil, Risperdal and he was also given Vyvanse to help his attention and focus. He had a good response with Cymbalta and Wellbutrin  and he stopped these medication after feeling better but relapsed into severe depression.Recently his Wellbutrin discontinued and Cymbalta increased but he drops into anxiety.    Past Medical History:  Past Medical History:  Diagnosis Date  . Acid reflux   . Acute MI, inferior wall, initial episode of care ([place]) 05/17/15  . Angina decubitus ([place]) 05/16/15  . Anxiety   . CAD in native artery    per cath 05/16/14  . Depression   . Hyperlipidemia LDL goal <70   . Prostate enlargement   . S/P coronary artery stent placement, DES promus premier, RCA 05/17/15 05/18/2015    Past Surgical History:  Procedure Laterality Date  . CARDIAC CATHETERIZATION N/A 05/17/2015   Procedure: Left Heart Cath and Coronary Angiography;  Surgeon: Sherren Mocha, MD;  Location: [facility];  Service: Cardiovascular;  Laterality: N/A;  . CARDIAC CATHETERIZATION N/A 05/17/2015   Procedure: Coronary Stent Intervention;  Surgeon: Sherren Mocha, MD;  Location: [facility];  Service: Cardiovascular;  Laterality: N/A;  . None      Family Psychiatric History: Reviewed  Family History:  Family History  Problem Relation Age of Onset  . Dementia Mother   . Stroke Mother   . Heart attack Father 30  . Suicidality Brother   . Depression Brother   . Heart attack Brother 52  . Hypertension Neg Hx     Social History:  Social  History   Socioeconomic History  . Marital status: Married    Spouse name: Not on file  . Number of children: 1  . Years of education: Not on file  . Highest education level: Not on file  Occupational History  . Occupation: RETIRED    Comment: [employer]  Social Needs  . Financial resource strain: Not on file  . Food insecurity:    Worry: Not on file    Inability: Not on file  . Transportation needs:    Medical: Not on file    Non-medical: Not on file  Tobacco Use  . Smoking status: Former Smoker    Packs/day: 0.25    Years: 40.00    Pack years: 10.00    Types:  Cigarettes  . Smokeless tobacco: Never Used  . Tobacco comment: Quit 20 years ago  Substance and Sexual Activity  . Alcohol use: Yes    Alcohol/week: 2.0 standard drinks    Types: 2 Cans of beer per week  . Drug use: No  . Sexual activity: Yes    Partners: Female    Birth control/protection: None  Lifestyle  . Physical activity:    Days per week: Not on file    Minutes per session: Not on file  . Stress: Not on file  Relationships  . Social connections:    Talks on phone: Not on file    Gets together: Not on file    Attends religious service: Not on file    Active member of club or organization: Not on file    Attends meetings of clubs or organizations: Not on file    Relationship status: Not on file  Other Topics Concern  . Not on file  Social History Narrative   Retired [employer]    Allergies: No Known Allergies  Metabolic Disorder Labs: Lab Results  Component Value Date   HGBA1C 5.6 05/18/2015   MPG 114 05/18/2015   No results found for: PROLACTIN Lab Results  Component Value Date   CHOL 188 05/17/2015   TRIG 59 05/17/2015   HDL 49 05/17/2015   CHOLHDL 3.8 05/17/2015   VLDL 12 05/17/2015   LDLCALC 127 (H) 05/17/2015   [place] 122 08/30/2009   Lab Results  Component Value Date   TSH 1.750 05/17/2015   TSH 1.658 Test methodology is 3rd generation TSH 07/16/2007    Therapeutic Level Labs: No results found for: LITHIUM No results found for: VALPROATE No components found for:  CBMZ  Current Medications: Current Outpatient Medications  Medication Sig Dispense Refill  . acetaminophen (TYLENOL) 325 MG tablet Take 2 tablets (650 mg total) by mouth every 4 (four) hours as needed for headache or mild pain.    Marland Kitchen ALPRAZolam (XANAX) 0.5 MG tablet Take 1 tablet (0.5 mg total) by mouth daily as needed for anxiety. 30 tablet 0  . aspirin EC 81 MG tablet Take 1 tablet (81 mg total) by mouth daily. 90 tablet 3  . atorvastatin (LIPITOR) 80 MG tablet Take 1 tablet (80 mg  total) by mouth daily at 6 PM. 30 tablet 6  . cholecalciferol (VITAMIN D) 1000 UNITS tablet Take 1,000 Units by mouth daily.    . cycloSPORINE (RESTASIS) 0.05 % ophthalmic emulsion Place 1 drop into both eyes 2 (two) times daily. For dry eyes per patient    . DULoxetine (CYMBALTA) 30 MG capsule Take 1 capsule (30 mg total) by mouth daily. 90 capsule 0  . famotidine (PEPCID) 20 MG tablet Take 20  mg by mouth daily.    . finasteride (PROSCAR) 5 MG tablet Take 5 mg by mouth daily.    . metoprolol tartrate (LOPRESSOR) 25 MG tablet Take 1 tablet (25 mg total) by mouth 2 (two) times daily. 180 tablet 1  . nitroGLYCERIN (NITROSTAT) 0.4 MG SL tablet Place 1 tablet (0.4 mg total) under the tongue every 5 (five) minutes as needed for chest pain. 25 tablet 2  . ranitidine (ZANTAC) 150 MG tablet Take 150 mg by mouth every evening.     . tamsulosin (FLOMAX) 0.4 MG CAPS capsule Take 0.4 mg by mouth daily.      No current facility-administered medications for this visit.      Musculoskeletal: Strength & Muscle Tone: within normal limits Gait & Station: normal Patient leans: N/A  Psychiatric Specialty Exam: Review of Systems  Constitutional: Negative for weight loss.  Musculoskeletal:       Comes and goes mild chest pain for 6 weeks  Psychiatric/Behavioral: The patient is nervous/anxious.     Blood pressure (!) 154/82, pulse 74, height 5\' 6"  (1.676 m), weight 201 lb (91.2 kg), SpO2 97 %.Body mass index is 32.44 kg/m.  General Appearance: Casual  Eye Contact:  Good  Speech:  Clear and Coherent  Volume:  Normal  Mood:  Anxious  Affect:  Appropriate  Thought Process:  Goal Directed  Orientation:  Full (Time, Place, and Person)  Thought Content: Logical   Suicidal Thoughts:  No  Homicidal Thoughts:  No  Memory:  Immediate;   Good Recent;   Good Remote;   Good  Judgement:  Good  Insight:  Good  Psychomotor Activity:  Normal  Concentration:  Concentration: Good and Attention Span: Good   Recall:  Good  Fund of Knowledge: Good  Language: Good  Akathisia:  No  Handed:  Right  AIMS (if indicated): not done  Assets:  Communication Skills Desire for Improvement Housing  ADL's:  Intact  Cognition: WNL  Sleep:  Good   Screenings: PHQ2-9     CARDIAC REHAB PHASE II EXERCISE from 09/15/2015 in [facility] from 06/12/2015 in [facility]  PHQ-2 Total Score  0  0       Assessment and Plan: Major depressive disorder, recurrent.  Generalized anxiety disorder.  I reviewed his notes, current medication and symptoms.  I do believe increasing Cymbalta may have caused increase anxiety and nervousness along with sweating.  He also gained 3 pounds.  He feels comfortable going back to low-dose of Cymbalta and taking Wellbutrin XL 150 mg daily which she has done for many years with good response.  He also scheduled to see his cardiologist to discuss about his symptoms.  I will decrease Cymbalta to 20 mg daily and start Wellbutrin XL 150 mg daily however I reminded that he should discuss with his cardiologist if Wellbutrin is okay with his heart condition.  Recommended to call us back if is any question or any concern.  Follow-up in 3 months.   Kathlee Nations, MD 07/08/2018, 3:02 PM

## 2018-07-09 ENCOUNTER — Encounter: Payer: Self-pay | Admitting: Cardiology

## 2018-07-09 ENCOUNTER — Ambulatory Visit: Payer: Medicare Other | Admitting: Cardiology

## 2018-07-09 VITALS — BP 126/80 | HR 63 | Ht 66.0 in | Wt 202.4 lb

## 2018-07-09 DIAGNOSIS — I2510 Atherosclerotic heart disease of native coronary artery without angina pectoris: Secondary | ICD-10-CM

## 2018-07-09 DIAGNOSIS — F411 Generalized anxiety disorder: Secondary | ICD-10-CM | POA: Diagnosis not present

## 2018-07-09 DIAGNOSIS — R079 Chest pain, unspecified: Secondary | ICD-10-CM | POA: Diagnosis not present

## 2018-07-09 DIAGNOSIS — E785 Hyperlipidemia, unspecified: Secondary | ICD-10-CM

## 2018-07-09 DIAGNOSIS — Z9861 Coronary angioplasty status: Secondary | ICD-10-CM

## 2018-07-09 DIAGNOSIS — I252 Old myocardial infarction: Secondary | ICD-10-CM

## 2018-07-09 NOTE — Assessment & Plan Note (Signed)
NSTEMI March 2016

## 2018-07-09 NOTE — Assessment & Plan Note (Signed)
Followed by psychiatry- medications recently adjusted

## 2018-07-09 NOTE — Assessment & Plan Note (Signed)
Randy Peterson has had recent chest pain, similar to pre PCI symptoms though not as severe.

## 2018-07-09 NOTE — Assessment & Plan Note (Signed)
S/P coronary artery stent placement, DES promus premier, RCA 05/17/15

## 2018-07-09 NOTE — Telephone Encounter (Signed)
Seen today in clinic.

## 2018-07-09 NOTE — Progress Notes (Signed)
07/09/2018 Randy Peterson   1945/09/27  [id number]  Primary Physician Derinda Late, MD Primary Cardiologist: Dr Percival Spanish  HPI:  72 y/o male followed by Dr Percival Spanish with a history of CAD, s/p NSTEMI and RCA PCI March 2016. He had residual mild CFX and LAD disease and a 60% Dx1. He has done well on medical Rx. Echo Aug 2016 showed an EF of 50-55% with grade 2 DD. He is in the office today after he had an episode of SSCP 48 hours ago and another 24 hours ago.  The pt has a history of anxiety and depression. In August his psychiatrist changed him from Wellbutrin and Cymbalta to just Cymbalta at a slightly higher dose. On Monday this week he went to the gym for the first time on several months, no problem with walking on the treadmill.  On Tuesday he was driving when he had an episode of vague SSCP "pressure" and "tingling" in his Lt arm. He thought it was similar though not as severe as his pre PCI symptoms. He attributed this to the recent change in his medications. This resolved spontaneously. Yesterday he had an episode of chest discomfort, tingling, and dizziness. He took his B/P and it was higher than usual for him. He saw his psychiatrist yesterday and was put back on his original prescription of Wellbutrin and Cymbalta.    Current Outpatient Medications  Medication Sig Dispense Refill  . acetaminophen (TYLENOL) 325 MG tablet Take 2 tablets (650 mg total) by mouth every 4 (four) hours as needed for headache or mild pain.    Marland Kitchen ALPRAZolam (XANAX) 0.5 MG tablet Take 1 tablet (0.5 mg total) by mouth daily as needed for anxiety. 30 tablet 0  . aspirin EC 81 MG tablet Take 1 tablet (81 mg total) by mouth daily. 90 tablet 3  . atorvastatin (LIPITOR) 80 MG tablet Take 1 tablet (80 mg total) by mouth daily at 6 PM. 30 tablet 6  . cholecalciferol (VITAMIN D) 1000 UNITS tablet Take 1,000 Units by mouth daily.    . cycloSPORINE (RESTASIS) 0.05 % ophthalmic emulsion Place 1 drop into both eyes 2  (two) times daily. For dry eyes per patient    . DULoxetine (CYMBALTA) 30 MG capsule Take 30 mg by mouth daily.    . famotidine (PEPCID) 20 MG tablet Take 20 mg by mouth daily.    . finasteride (PROSCAR) 5 MG tablet Take 5 mg by mouth daily.    . metoprolol tartrate (LOPRESSOR) 25 MG tablet Take 1 tablet (25 mg total) by mouth 2 (two) times daily. 180 tablet 1  . nitroGLYCERIN (NITROSTAT) 0.4 MG SL tablet Place 1 tablet (0.4 mg total) under the tongue every 5 (five) minutes as needed for chest pain. 25 tablet 2  . ranitidine (ZANTAC) 150 MG tablet Take 150 mg by mouth every evening.     . tamsulosin (FLOMAX) 0.4 MG CAPS capsule Take 0.4 mg by mouth daily.     Marland Kitchen buPROPion (WELLBUTRIN XL) 150 MG 24 hr tablet Take 1 tablet (150 mg total) by mouth daily. (Patient not taking: Reported on 07/09/2018) 90 tablet 0   No current facility-administered medications for this visit.     No Known Allergies  Past Medical History:  Diagnosis Date  . Acid reflux   . Acute MI, inferior wall, initial episode of care ([place]) 05/17/15  . Angina decubitus ([place]) 05/16/15  . Anxiety   . CAD in native artery    per cath 05/16/14  .  Depression   . Hyperlipidemia LDL goal <70   . Prostate enlargement   . S/P coronary artery stent placement, DES promus premier, RCA 05/17/15 05/18/2015    Social History   Socioeconomic History  . Marital status: Married    Spouse name: Not on file  . Number of children: 1  . Years of education: Not on file  . Highest education level: Not on file  Occupational History  . Occupation: RETIRED    Comment: [employer]  Social Needs  . Financial resource strain: Not on file  . Food insecurity:    Worry: Not on file    Inability: Not on file  . Transportation needs:    Medical: Not on file    Non-medical: Not on file  Tobacco Use  . Smoking status: Former Smoker    Packs/day: 0.25    Years: 40.00    Pack years: 10.00    Types: Cigarettes  . Smokeless tobacco: Never Used    . Tobacco comment: Quit 20 years ago  Substance and Sexual Activity  . Alcohol use: Yes    Alcohol/week: 2.0 standard drinks    Types: 2 Cans of beer per week  . Drug use: No  . Sexual activity: Yes    Partners: Female    Birth control/protection: None  Lifestyle  . Physical activity:    Days per week: Not on file    Minutes per session: Not on file  . Stress: Not on file  Relationships  . Social connections:    Talks on phone: Not on file    Gets together: Not on file    Attends religious service: Not on file    Active member of club or organization: Not on file    Attends meetings of clubs or organizations: Not on file    Relationship status: Not on file  . Intimate partner violence:    Fear of current or ex partner: Not on file    Emotionally abused: Not on file    Physically abused: Not on file    Forced sexual activity: Not on file  Other Topics Concern  . Not on file  Social History Narrative   Retired [employer]     Family History  Problem Relation Age of Onset  . Dementia Mother   . Stroke Mother   . Heart attack Father 50  . Suicidality Brother   . Depression Brother   . Heart attack Brother 53  . Hypertension Neg Hx      Review of Systems: General: negative for chills, fever, night sweats or weight changes.  Cardiovascular: negative for dyspnea on exertion, edema, orthopnea, palpitations, paroxysmal nocturnal dyspnea or shortness of breath Dermatological: negative for rash Respiratory: negative for cough or wheezing Urologic: negative for hematuria Abdominal: negative for nausea, vomiting, diarrhea, bright red blood per rectum, melena, or hematemesis Neurologic: negative for visual changes, syncope, or dizziness All other systems reviewed and are otherwise negative except as noted above.    Blood pressure 126/80, pulse 63, height 5\' 6"  (1.676 m), weight 202 lb 6.4 oz (91.8 kg).  General appearance: alert, cooperative and no distress Neck: no carotid  bruit and no JVD Lungs: clear to auscultation bilaterally Heart: regular rate and rhythm Extremities: extremities normal, atraumatic, no cyanosis or edema Skin: Skin color, texture, turgor normal. No rashes or lesions Neurologic: Grossly normal  EKG NSR, PVC, poor anterior RW  ASSESSMENT AND PLAN:   CAD S/P PCI S/P coronary artery stent placement, DES  promus premier, RCA 05/17/15  Chest pain with moderate risk for cardiac etiology Py has had recent chest pain, similar to pre PCI symptoms though not as severe.   Generalized anxiety disorder Followed by psychiatry- medications recently adjusted  Dyslipidemia On high dose statin Rx  History of NSTEMI NSTEMI March 2016   PLAN  Will proceed with GXT Myoview. He has a current Rx for NTG. OK to hold Lopressor for treadmill.   Kerin Ransom PA-C 07/09/2018 10:15 AM

## 2018-07-09 NOTE — Patient Instructions (Addendum)
Medication Instructions:  Your physician recommends that you continue on your current medications as directed. Please refer to the Current Medication list given to you today.  If you need a refill on your cardiac medications before your next appointment, please call your pharmacy.   Lab work: None  If you have labs (blood work) drawn today and your tests are completely normal, you will receive your results only by: Marland Kitchen MyChart Message (if you have MyChart) OR . A paper copy in the mail If you have any lab test that is abnormal or we need to change your treatment, we will call you to review the results.  Testing/Procedures: Your physician has requested that you have an exercise stress myoview. For further information please visit [email or web address]. Please follow instruction sheet, as given. HOLD YOUR LOPRESSOR THE MORNING OF YOUR TEST  Follow-Up: At [facility], you and your health needs are our priority.  As part of our continuing mission to provide you with exceptional heart care, we have created designated Provider Care Teams.  These Care Teams include your primary Cardiologist (physician) and Advanced Practice Providers (APPs -  Physician Assistants and Nurse Practitioners) who all work together to provide you with the care you need, when you need it.  Your physician recommends that you schedule a follow-up appointment in: 3 months with Dr Percival Spanish.   Any Other Special Instructions Will Be Listed Below (If Applicable).

## 2018-07-09 NOTE — Assessment & Plan Note (Signed)
On high dose statin Rx 

## 2018-07-14 ENCOUNTER — Telehealth (HOSPITAL_COMMUNITY): Payer: Self-pay

## 2018-07-14 NOTE — Telephone Encounter (Signed)
Encounter complete. 

## 2018-07-15 ENCOUNTER — Ambulatory Visit (HOSPITAL_COMMUNITY)
Admission: RE | Admit: 2018-07-15 | Discharge: 2018-07-15 | Disposition: A | Discharge status: Home / Self Care | Payer: Medicare Other | Source: Ambulatory Visit | Attending: Cardiology | Admitting: Cardiology

## 2018-07-15 DIAGNOSIS — F411 Generalized anxiety disorder: Secondary | ICD-10-CM

## 2018-07-15 DIAGNOSIS — R079 Chest pain, unspecified: Secondary | ICD-10-CM

## 2018-07-15 DIAGNOSIS — E785 Hyperlipidemia, unspecified: Secondary | ICD-10-CM | POA: Diagnosis present

## 2018-07-15 DIAGNOSIS — I2510 Atherosclerotic heart disease of native coronary artery without angina pectoris: Secondary | ICD-10-CM

## 2018-07-15 DIAGNOSIS — Z9861 Coronary angioplasty status: Secondary | ICD-10-CM | POA: Diagnosis present

## 2018-07-15 DIAGNOSIS — I252 Old myocardial infarction: Secondary | ICD-10-CM | POA: Diagnosis present

## 2018-07-15 LAB — MYOCARDIAL PERFUSION IMAGING
Estimated workload: 10.1 METS
Exercise duration (min): 8 min
Exercise duration (sec): 30 s
LV dias vol: 87 mL (ref 62–150)
LV sys vol: 33 mL
MPHR: 150 {beats}/min
Peak HR: 148 {beats}/min
Percent HR: 98 %
RPE: 19
Rest HR: 68 {beats}/min
SDS: 4
SRS: 3
SSS: 7
TID: 0.97

## 2018-07-15 MED ORDER — TECHNETIUM TC 99M TETROFOSMIN IV KIT
31.1000 | PACK | Freq: Once | INTRAVENOUS | Status: AC | PRN
  Administered 2018-07-15: 31.1 via INTRAVENOUS
  Filled 2018-07-15: qty 32

## 2018-07-15 MED ORDER — TECHNETIUM TC 99M TETROFOSMIN IV KIT
9.9000 | PACK | Freq: Once | INTRAVENOUS | Status: AC | PRN
  Administered 2018-07-15: 9.9 via INTRAVENOUS
  Filled 2018-07-15: qty 10

## 2018-07-22 ENCOUNTER — Inpatient Hospital Stay (HOSPITAL_COMMUNITY): Admission: RE | Admit: 2018-07-22 | Payer: Self-pay | Source: Ambulatory Visit

## 2018-10-01 ENCOUNTER — Other Ambulatory Visit (HOSPITAL_COMMUNITY): Payer: Self-pay

## 2018-10-01 DIAGNOSIS — F330 Major depressive disorder, recurrent, mild: Secondary | ICD-10-CM

## 2018-10-01 DIAGNOSIS — F411 Generalized anxiety disorder: Secondary | ICD-10-CM

## 2018-10-01 MED ORDER — DULOXETINE HCL 20 MG PO CPEP: 20.0000 mg | ORAL_CAPSULE | Freq: Every day | ORAL | 1 refills | Status: DC

## 2018-10-01 MED ORDER — BUPROPION HCL ER (XL) 150 MG PO TB24: 150.0000 mg | ORAL_TABLET | Freq: Every day | ORAL | 1 refills | Status: DC

## 2018-10-08 ENCOUNTER — Ambulatory Visit (HOSPITAL_COMMUNITY): Payer: Medicare Other | Admitting: Psychiatry

## 2018-10-14 NOTE — Progress Notes (Signed)
Cardiology Office Note   Date:  10/15/2018   ID:  Randy Peterson, DOB November 19, 1945, MRN [id number]  PCP:  Derinda Late, MD  Cardiologist:   Minus Breeding, MD    Chief Complaint  Patient presents with  . Coronary Artery Disease     History of Present Illness: Randy Peterson is a 73 y.o. male who presents for follow-up of inferior myocardial infarction. 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  Since I last saw him he was seen by Beatris Si PAc and he reported chest pain.  Perfusion study in Oct was negative for ischemia.    He thinks this was related to a med change with his Wellbutrin and that has been changed back and he now feels back to baseline.  He has been active and exercising 3 times a week at the gym. The patient denies any new symptoms such as chest discomfort, neck or arm discomfort. There has been no new shortness of breath, PND or orthopnea. There have been no reported palpitations, presyncope or syncope.    Past Medical History:  Diagnosis Date  . Acid reflux   . Acute MI, inferior wall, initial episode of care ([place]) 05/17/15  . Angina decubitus ([place]) 05/16/15  . Anxiety   . CAD in native artery    per cath 05/16/14  . Depression   . Hyperlipidemia LDL goal <70   . Prostate enlargement   . S/P coronary artery stent placement, DES promus premier, RCA 05/17/15 05/18/2015    Past Surgical History:  Procedure Laterality Date  . CARDIAC CATHETERIZATION N/A 05/17/2015   Procedure: Left Heart Cath and Coronary Angiography;  Surgeon: Sherren Mocha, MD;  Location: [place] [facility];  Service: Cardiovascular;  Laterality: N/A;  . CARDIAC CATHETERIZATION N/A 05/17/2015   Procedure: Coronary Stent Intervention;  Surgeon: Sherren Mocha, MD;  Location: [place] [facility];  Service: Cardiovascular;  Laterality: N/A;  . None       Current Outpatient Medications  Medication Sig Dispense Refill  . acetaminophen (TYLENOL) 325 MG tablet  Take 2 tablets (650 mg total) by mouth every 4 (four) hours as needed for headache or mild pain.    Marland Kitchen ALPRAZolam (XANAX) 0.5 MG tablet Take 1 tablet (0.5 mg total) by mouth daily as needed for anxiety. 30 tablet 0  . aspirin EC 81 MG tablet Take 1 tablet (81 mg total) by mouth daily. 90 tablet 3  . atorvastatin (LIPITOR) 80 MG tablet Take 1 tablet (80 mg total) by mouth daily at 6 PM. 30 tablet 6  . buPROPion (WELLBUTRIN XL) 150 MG 24 hr tablet Take 1 tablet (150 mg total) by mouth daily. 30 tablet 1  . cholecalciferol (VITAMIN D) 1000 UNITS tablet Take 1,000 Units by mouth daily.    . cycloSPORINE (RESTASIS) 0.05 % ophthalmic emulsion Place 1 drop into both eyes 2 (two) times daily. For dry eyes per patient    . DULoxetine (CYMBALTA) 20 MG capsule Take 1 capsule (20 mg total) by mouth daily. 30 capsule 1  . famotidine (PEPCID) 20 MG tablet Take 20 mg by mouth daily.    . finasteride (PROSCAR) 5 MG tablet Take 5 mg by mouth daily.    . metoprolol tartrate (LOPRESSOR) 25 MG tablet Take 1 tablet (25 mg total) by mouth 2 (two) times daily. 180 tablet 1  . nitroGLYCERIN (NITROSTAT) 0.4 MG SL tablet Place 1 tablet (0.4 mg total) under the tongue every 5 (five) minutes  as needed for chest pain. 25 tablet 2  . tamsulosin (FLOMAX) 0.4 MG CAPS capsule Take 0.4 mg by mouth daily.      No current facility-administered medications for this visit.     Allergies:   Patient has no known allergies.    ROS:  As stated in the HPI and negative for all other systems.   PHYSICAL EXAM: VS:  BP 117/75   Pulse 63   Ht 5\' 6"  (1.676 m)   Wt 203 lb 12.8 oz (92.4 kg)   BMI 32.89 kg/m  , BMI Body mass index is 32.89 kg/m.  GENERAL:  Well appearing NECK:  No jugular venous distention, waveform within normal limits, carotid upstroke brisk and symmetric, no bruits, no thyromegaly LUNGS:  Clear to auscultation bilaterally CHEST:  Unremarkable HEART:  PMI not displaced or sustained,S1 and S2 within normal limits,  no S3, no S4, no clicks, no rubs, no murmurs ABD:  Flat, positive bowel sounds normal in frequency in pitch, no bruits, no rebound, no guarding, no midline pulsatile mass, no hepatomegaly, no splenomegaly EXT:  2 plus pulses throughout, no edema, no cyanosis no clubbing   EKG:  EKG is not ordered today.   Recent Labs: No results found for requested labs within last 8760 hours.     Wt Readings from Last 3 Encounters:  10/15/18 203 lb 12.8 oz (92.4 kg)  07/15/18 202 lb (91.6 kg)  07/09/18 202 lb 6.4 oz (91.8 kg)      Other studies Reviewed: Additional studies/ records that were reviewed today include:  Lexiscan Myoview. Review of the above records demonstrates:      ASSESSMENT AND PLAN:  CAD:  The patient has no new sypmtoms.  No change in therapy.  No further testing.  Continue with risk reduction.   DYSLIPIDEMIA:     This is followed by Derinda Late, MD.   I do not have the most recent labs but he says his lipids are well controlled and I will defer.   Current medicines are reviewed at length with the patient today.  The patient does not have concerns regarding medicines.  The following changes have been made:   None  Labs/ tests ordered today include:   None  No orders of the defined types were placed in this encounter.    Disposition:   FU with 12 - 18  months.      Signed, Minus Breeding, MD  10/15/2018 11:34 AM    [facility]

## 2018-10-15 ENCOUNTER — Ambulatory Visit: Payer: Medicare Other | Admitting: Cardiology

## 2018-10-15 ENCOUNTER — Encounter: Payer: Self-pay | Admitting: Cardiology

## 2018-10-15 VITALS — BP 117/75 | HR 63 | Ht 66.0 in | Wt 203.8 lb

## 2018-10-15 DIAGNOSIS — I2510 Atherosclerotic heart disease of native coronary artery without angina pectoris: Secondary | ICD-10-CM | POA: Diagnosis not present

## 2018-10-15 DIAGNOSIS — E785 Hyperlipidemia, unspecified: Secondary | ICD-10-CM | POA: Diagnosis not present

## 2018-10-15 NOTE — Patient Instructions (Signed)
Medication Instructions:  Continue current medications  If you need a refill on your cardiac medications before your next appointment, please call your pharmacy.  Labwork: None Ordered   Take the provided lab slips with you to the lab for your blood draw.   When you have your labs (blood work) drawn today and your tests are completely normal, you will receive your results only by MyChart Message (if you have MyChart) -OR-  A paper copy in the mail.  If you have any lab test that is abnormal or we need to change your treatment, we will call you to review these results.  Testing/Procedures: None Ordered  Follow-Up: You will need a follow up appointment in 12 months.  Please call our office 2 months in advance to schedule this appointment.  You may see Dr Hochrein or one of the following Advanced Practice Providers on your designated Care Team:   Rhonda Barrett, PA-C . Kathryn Lawrence, DNP, ANP    At [facility], you and your health needs are our priority.  As part of our continuing mission to provide you with exceptional heart care, we have created designated Provider Care Teams.  These Care Teams include your primary Cardiologist (physician) and Advanced Practice Providers (APPs -  Physician Assistants and Nurse Practitioners) who all work together to provide you with the care you need, when you need it.  Thank you for choosing [facility] at [facility]!!     

## 2018-11-10 ENCOUNTER — Ambulatory Visit (HOSPITAL_COMMUNITY): Payer: Medicare Other | Admitting: Psychiatry

## 2018-11-18 ENCOUNTER — Ambulatory Visit (HOSPITAL_COMMUNITY): Payer: Medicare Other | Admitting: Psychiatry

## 2018-11-30 ENCOUNTER — Other Ambulatory Visit (HOSPITAL_COMMUNITY): Payer: Self-pay

## 2018-11-30 DIAGNOSIS — F330 Major depressive disorder, recurrent, mild: Secondary | ICD-10-CM

## 2018-11-30 DIAGNOSIS — F411 Generalized anxiety disorder: Secondary | ICD-10-CM

## 2018-11-30 MED ORDER — BUPROPION HCL ER (XL) 150 MG PO TB24: 150.0000 mg | ORAL_TABLET | Freq: Every day | ORAL | 1 refills | Status: DC

## 2018-11-30 MED ORDER — DULOXETINE HCL 20 MG PO CPEP: 20.0000 mg | ORAL_CAPSULE | Freq: Every day | ORAL | 1 refills | Status: DC

## 2018-12-29 ENCOUNTER — Ambulatory Visit (INDEPENDENT_AMBULATORY_CARE_PROVIDER_SITE_OTHER): Payer: Medicare Other | Admitting: Psychiatry

## 2018-12-29 ENCOUNTER — Other Ambulatory Visit: Payer: Self-pay

## 2018-12-29 DIAGNOSIS — F411 Generalized anxiety disorder: Secondary | ICD-10-CM | POA: Diagnosis not present

## 2018-12-29 DIAGNOSIS — F330 Major depressive disorder, recurrent, mild: Secondary | ICD-10-CM | POA: Diagnosis not present

## 2018-12-29 MED ORDER — BUPROPION HCL ER (XL) 150 MG PO TB24: 150.0000 mg | ORAL_TABLET | Freq: Every day | ORAL | 0 refills | Status: DC

## 2018-12-29 MED ORDER — DULOXETINE HCL 20 MG PO CPEP: 20.0000 mg | ORAL_CAPSULE | Freq: Every day | ORAL | 0 refills | Status: DC

## 2018-12-29 NOTE — Progress Notes (Signed)
Virtual Visit via Telephone Note  I connected with Randy Peterson on 12/29/18 at 10:40 AM EDT by telephone and verified that I am speaking with the correct person using two identifiers.   I discussed the limitations, risks, security and privacy concerns of performing an evaluation and management service by telephone and the availability of in person appointments. I also discussed with the patient that there may be a patient responsible charge related to this service. The patient expressed understanding and agreed to proceed.   History of Present Illness: Patient was evaluated through phone conversation.  He was last seen in October and he missed appointment in January.  But he is taking his medication and he is doing well.  He admitted some anxiety due to pandemic coronavirus as he cannot leave the house but he understand that he has no choice.  He is sleeping good.  He denies any panic attack or any nervousness.  He is taking Cymbalta and Wellbutrin and reported no tremors or shakes.  Denies any crying spells or any feeling of hopelessness or worthlessness.  He is pleased that his family is doing well.  His living situation is better and he has no new stressors in his personal life.  Like to continue his current medication.  Denies drinking or using any illegal substances.  He lives with his wife who is supportive.  Past Psychiatric History: Reviewed. H/O inpatient in 2008 for suicidal attemptand cut his wrist.He was depressed due to his job and finances.  Took Paxil, Risperdal and given Vyvanse for attention and focus. Good response with Cymbalta and Wellbutrin but stopped after feeling better. Restarted again due to relapsed.     Observations/Objective: Limited mental status examination done on the phone.  Patient describes his mood okay.  His speech is soft, clear, coherent with normal tone and volume.  He denies any auditory or visual hallucination.  He denies any active or passive suicidal  thoughts or homicidal thought.  There were no delusions or any paranoia.  His attention and concentration is okay.  He is alert and oriented x3.  His fund of knowledge is adequate.  His cognition is intact.  His insight judgment is okay.  Assessment and Plan: Major depressive disorder, recurrent.  Generalized anxiety disorder.  We will continue Cymbalta 20 mg daily and Wellbutrin XL 150 mg daily.  In the past we have increase the medication dose but he had tremors.  He is comfortable with the current dose.  Discussed medication side effects and benefits.  Recommended to call us back if is any question or any concern.  Follow-up in 3 months.  Follow Up Instructions:    I discussed the assessment and treatment plan with the patient. The patient was provided an opportunity to ask questions and all were answered. The patient agreed with the plan and demonstrated an understanding of the instructions.   The patient was advised to call back or seek an in-person evaluation if the symptoms worsen or if the condition fails to improve as anticipated.  I provided 20 minutes of non-face-to-face time during this encounter.   Kathlee Nations, MD

## 2019-02-24 ENCOUNTER — Other Ambulatory Visit: Payer: Self-pay | Admitting: Cardiology

## 2019-03-31 ENCOUNTER — Other Ambulatory Visit (HOSPITAL_COMMUNITY): Payer: Self-pay

## 2019-03-31 DIAGNOSIS — F411 Generalized anxiety disorder: Secondary | ICD-10-CM

## 2019-03-31 DIAGNOSIS — F330 Major depressive disorder, recurrent, mild: Secondary | ICD-10-CM

## 2019-03-31 MED ORDER — BUPROPION HCL ER (XL) 150 MG PO TB24: 150.0000 mg | ORAL_TABLET | Freq: Every day | ORAL | 0 refills | Status: DC

## 2019-03-31 MED ORDER — DULOXETINE HCL 20 MG PO CPEP: 20.0000 mg | ORAL_CAPSULE | Freq: Every day | ORAL | 0 refills | Status: DC

## 2019-04-01 ENCOUNTER — Ambulatory Visit (HOSPITAL_COMMUNITY): Payer: Medicare Other | Admitting: Psychiatry

## 2019-04-08 ENCOUNTER — Ambulatory Visit (HOSPITAL_COMMUNITY): Payer: Medicare Other | Admitting: Psychiatry

## 2019-04-14 ENCOUNTER — Encounter (HOSPITAL_COMMUNITY): Payer: Self-pay | Admitting: Psychiatry

## 2019-04-14 ENCOUNTER — Other Ambulatory Visit: Payer: Self-pay

## 2019-04-14 ENCOUNTER — Ambulatory Visit (INDEPENDENT_AMBULATORY_CARE_PROVIDER_SITE_OTHER): Payer: Medicare Other | Admitting: Psychiatry

## 2019-04-14 DIAGNOSIS — F411 Generalized anxiety disorder: Secondary | ICD-10-CM | POA: Diagnosis not present

## 2019-04-14 DIAGNOSIS — F330 Major depressive disorder, recurrent, mild: Secondary | ICD-10-CM | POA: Diagnosis not present

## 2019-04-14 MED ORDER — BUPROPION HCL ER (XL) 150 MG PO TB24: 150.0000 mg | ORAL_TABLET | Freq: Every day | ORAL | 0 refills | Status: DC

## 2019-04-14 MED ORDER — DULOXETINE HCL 20 MG PO CPEP: 20.0000 mg | ORAL_CAPSULE | Freq: Every day | ORAL | 0 refills | Status: DC

## 2019-04-14 NOTE — Progress Notes (Signed)
Virtual Visit via Telephone Note  I connected with Randy Peterson on 04/14/19 at 11:20 AM EDT by telephone and verified that I am speaking with the correct person using two identifiers.   I discussed the limitations, risks, security and privacy concerns of performing an evaluation and management service by telephone and the availability of in person appointments. I also discussed with the patient that there may be a patient responsible charge related to this service. The patient expressed understanding and agreed to proceed.   History of Present Illness: Patient was evaluated by phone session.  He is doing well on Cymbalta and Wellbutrin.  He denies any tremors shakes or any EPS.  He denies any depression or any crying spells.  He has not taken Xanax in a while but he still has refill remaining.  His sleep is good.  Denies any crying spells or any feeling of hopelessness or worthlessness.  He reported his family life is doing well and there has been no recent stressors.  Denies drinking or using any illegal substances.  He is trying to lose weight and start walking every day.  Recently he was given Naprosyn for joint pain which is helping his joint pain.  He lives with his wife who is supportive.  He denies drinking or using any illegal substances.  Past Psychiatric History:Reviewed. H/O inpatient in 2008 for suicidal attemptand cut his wrist.He was depressed due to his job and finances.  Took Paxil, Risperdal and given Vyvanse for attention and focus. Good response with Cymbalta and Wellbutrin but stopped after feeling better. Restarted again due to relapsed.    Psychiatric Specialty Exam: Physical Exam  ROS  There were no vitals taken for this visit.There is no height or weight on file to calculate BMI.  General Appearance: NA  Eye Contact:  NA  Speech:  Clear and Coherent and Normal Rate  Volume:  Normal  Mood:  Euthymic  Affect:  NA  Thought Process:  Goal Directed  Orientation:   Full (Time, Place, and Person)  Thought Content:  WDL and Logical  Suicidal Thoughts:  No  Homicidal Thoughts:  No  Memory:  Immediate;   Good Recent;   Good Remote;   Good  Judgement:  Good  Insight:  Good  Psychomotor Activity:  Normal  Concentration:  Concentration: Good and Attention Span: Good  Recall:  Good  Fund of Knowledge:  Good  Language:  Good  Akathisia:  No  Handed:  Right  AIMS (if indicated):     Assets:  Communication Skills Desire for Improvement Housing Resilience Social Support  ADL's:  Intact  Cognition:  WNL  Sleep:   ok      Assessment and Plan: Major depressive disorder, recurrent.  Generalized anxiety disorder.  Patient is a stable on his current medication.  He has no side effects.  I will continue Cymbalta 20 mg daily and Wellbutrin XL 150 mg daily.  In the past we have tried higher dose but causes tremors.  He is comfortable with the current dose and does not want to discontinue or reduce the medication since he had a relapse when he stopped the medicine.  Discussed medication side effects and benefits.  Recommended to call us back if is any question or any concern.  Patient is not interested in therapy.  Follow-up in 3 months.  Follow Up Instructions:    I discussed the assessment and treatment plan with the patient. The patient was provided an opportunity to ask questions and  all were answered. The patient agreed with the plan and demonstrated an understanding of the instructions.   The patient was advised to call back or seek an in-person evaluation if the symptoms worsen or if the condition fails to improve as anticipated.  I provided 15 minutes of non-face-to-face time during this encounter.   Kathlee Nations, MD

## 2019-06-15 ENCOUNTER — Other Ambulatory Visit: Payer: Self-pay | Admitting: Orthopedic Surgery

## 2019-06-16 ENCOUNTER — Ambulatory Visit: Admit: 2019-06-16 | Payer: Medicare Other | Admitting: Orthopedic Surgery

## 2019-06-16 SURGERY — ANTERIOR CERVICAL DECOMPRESSION/DISCECTOMY FUSION 2 LEVELS
Anesthesia: General

## 2019-07-15 ENCOUNTER — Other Ambulatory Visit: Payer: Self-pay

## 2019-07-15 ENCOUNTER — Encounter (HOSPITAL_COMMUNITY): Payer: Self-pay | Admitting: Psychiatry

## 2019-07-15 ENCOUNTER — Ambulatory Visit (INDEPENDENT_AMBULATORY_CARE_PROVIDER_SITE_OTHER): Payer: Medicare Other | Admitting: Psychiatry

## 2019-07-15 DIAGNOSIS — F330 Major depressive disorder, recurrent, mild: Secondary | ICD-10-CM | POA: Diagnosis not present

## 2019-07-15 DIAGNOSIS — F411 Generalized anxiety disorder: Secondary | ICD-10-CM

## 2019-07-15 MED ORDER — BUPROPION HCL ER (XL) 150 MG PO TB24: 150.0000 mg | ORAL_TABLET | Freq: Every day | ORAL | 0 refills | Status: DC

## 2019-07-15 MED ORDER — DULOXETINE HCL 20 MG PO CPEP: 20.0000 mg | ORAL_CAPSULE | Freq: Every day | ORAL | 0 refills | Status: DC

## 2019-07-15 NOTE — Progress Notes (Signed)
Virtual Visit via Telephone Note  I connected with Randy Peterson on 07/15/19 at 11:00 AM EDT by telephone and verified that I am speaking with the correct person using two identifiers.   I discussed the limitations, risks, security and privacy concerns of performing an evaluation and management service by telephone and the availability of in person appointments. I also discussed with the patient that there may be a patient responsible charge related to this service. The patient expressed understanding and agreed to proceed.   History of Present Illness: Patient was evaluated by phone session.  He is doing very well on Cymbalta and Wellbutrin.  He has not taken Xanax since 2019.  He is happy because his daughter got recently engaged.  He is sleeping good.  He denies any irritability, anger, crying spells or any feeling of hopelessness or worthlessness.  Energy level is good.  He is trying to lose weight and he has lost few pounds since the last visit.  Patient denies drinking or using any illegal substances.  He has no tremors, shakes or any EPS.  Like to continue Wellbutrin and Cymbalta.  Past Psychiatric History:Reviewed. H/Oinpatient in 2046forsuicidal attemptandcut his wrist.He was depressed due to his job and finances.TookPaxil, Risperdal and given Vyvanseforattention and focus. Good response with Cymbalta and Wellbutrinbutstoppedafterfeeling better. Restarted again due to relapsed.   Psychiatric Specialty Exam: Physical Exam  ROS  There were no vitals taken for this visit.There is no height or weight on file to calculate BMI.  General Appearance: NA  Eye Contact:  NA  Speech:  Clear and Coherent and Normal Rate  Volume:  Normal  Mood:  Euthymic  Affect:  NA  Thought Process:  Goal Directed  Orientation:  Full (Time, Place, and Person)  Thought Content:  WDL and Logical  Suicidal Thoughts:  No  Homicidal Thoughts:  No  Memory:  Immediate;   Good Recent;    Good Remote;   Good  Judgement:  Good  Insight:  Good  Psychomotor Activity:  NA  Concentration:  Concentration: Good and Attention Span: Good  Recall:  Good  Fund of Knowledge:  Good  Language:  Good  Akathisia:  No  Handed:  Right  AIMS (if indicated):     Assets:  Communication Skills Desire for Improvement Housing Resilience Social Support  ADL's:  Intact  Cognition:  WNL  Sleep:   ok      Assessment and Plan: Major depressive disorder, recurrent.  Generalized anxiety disorder.  Patient doing very well on Cymbalta and Wellbutrin.  He has not taken Xanax since 2019.  Discussed medication side effects and benefits.  Continue Cymbalta 20 mg daily and Wellbutrin XL 150 mg daily.  He will discontinue Xanax since he has not taken more than a year.  Recommended to call us back if he has any question or any concern.  Follow-up in 3 months.  Follow Up Instructions:    I discussed the assessment and treatment plan with the patient. The patient was provided an opportunity to ask questions and all were answered. The patient agreed with the plan and demonstrated an understanding of the instructions.   The patient was advised to call back or seek an in-person evaluation if the symptoms worsen or if the condition fails to improve as anticipated.  I provided 20 minutes of non-face-to-face time during this encounter.   Kathlee Nations, MD

## 2019-07-21 ENCOUNTER — Ambulatory Visit: Admit: 2019-07-21 | Payer: Medicare Other | Admitting: Orthopedic Surgery

## 2019-07-21 SURGERY — ANTERIOR CERVICAL DECOMPRESSION/DISCECTOMY FUSION 2 LEVELS
Anesthesia: General

## 2019-09-30 DIAGNOSIS — Z7189 Other specified counseling: Secondary | ICD-10-CM | POA: Insufficient documentation

## 2019-09-30 NOTE — Progress Notes (Signed)
Cardiology Office Note   Date:  10/01/2019   ID:  Randy Peterson, DOB 05-28-1946, MRN [id number]  PCP:  Derinda Late, MD  Cardiologist:   Minus Breeding, MD    Chief Complaint  Patient presents with  . Coronary Artery Disease     History of Present Illness: Randy Peterson is a 74 y.o. male who presents for follow-up of inferior myocardial infarction. 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 Perfusion study in Oct 2019 was negative for ischemia.  He returns for follow up.    Since I last saw him he has done well. The patient denies any new symptoms such as chest discomfort, neck or arm discomfort. There has been no new shortness of breath, PND or orthopnea. There have been no reported palpitations, presyncope or syncope.     Past Medical History:  Diagnosis Date  . Acid reflux   . Acute MI, inferior wall, initial episode of care ([place]) 05/17/15  . Angina decubitus ([place]) 05/16/15  . Anxiety   . CAD in native artery    per cath 05/16/14  . Depression   . Hyperlipidemia LDL goal <70   . Prostate enlargement   . S/P coronary artery stent placement, DES promus premier, RCA 05/17/15 05/18/2015    Past Surgical History:  Procedure Laterality Date  . CARDIAC CATHETERIZATION N/A 05/17/2015   Procedure: Left Heart Cath and Coronary Angiography;  Surgeon: Sherren Mocha, MD;  Location: [facility];  Service: Cardiovascular;  Laterality: N/A;  . CARDIAC CATHETERIZATION N/A 05/17/2015   Procedure: Coronary Stent Intervention;  Surgeon: Sherren Mocha, MD;  Location: [facility];  Service: Cardiovascular;  Laterality: N/A;  . None       Current Outpatient Medications  Medication Sig Dispense Refill  . acetaminophen (TYLENOL) 325 MG tablet Take 2 tablets (650 mg total) by mouth every 4 (four) hours as needed for headache or mild pain.    Marland Kitchen aspirin EC 81 MG tablet Take 1 tablet (81 mg total) by mouth daily. 90 tablet 3  . atorvastatin  (LIPITOR) 80 MG tablet Take 1 tablet (80 mg total) by mouth daily at 6 PM. 30 tablet 6  . buPROPion (WELLBUTRIN XL) 150 MG 24 hr tablet Take 1 tablet (150 mg total) by mouth daily. 90 tablet 0  . cholecalciferol (VITAMIN D) 1000 UNITS tablet Take 1,000 Units by mouth daily.    . cycloSPORINE (RESTASIS) 0.05 % ophthalmic emulsion Place 1 drop into both eyes 2 (two) times daily. For dry eyes per patient    . DULoxetine (CYMBALTA) 20 MG capsule Take 1 capsule (20 mg total) by mouth daily. 90 capsule 0  . famotidine (PEPCID) 20 MG tablet Take 20 mg by mouth daily.    . finasteride (PROSCAR) 5 MG tablet Take 5 mg by mouth daily.    . metoprolol tartrate (LOPRESSOR) 25 MG tablet Take 1 tablet (25 mg total) by mouth 2 (two) times daily. 180 tablet 1  . naproxen (NAPROSYN) 500 MG tablet TK 1 T PO BID PC PRF ARTHRITIS    . nitroGLYCERIN (NITROSTAT) 0.4 MG SL tablet PLACE 1 TABLET UNDER THE TONGUE EVERY 5 MINUTES AS NEEDED FOR CHEST PAIN 25 tablet 3  . tamsulosin (FLOMAX) 0.4 MG CAPS capsule Take 0.4 mg by mouth daily.      No current facility-administered medications for this visit.    Allergies:   Patient has no known allergies.    ROS:  As  stated in the HPI and negative for all other systems.   PHYSICAL EXAM: VS:  BP 116/62   Pulse (!) 55   Ht 5\' 6"  (1.676 m)   Wt 207 lb (93.9 kg)   BMI 33.41 kg/m  , BMI Body mass index is 33.41 kg/m.  GENERAL:  Well appearing NECK:  No jugular venous distention, waveform within normal limits, carotid upstroke brisk and symmetric, no bruits, no thyromegaly LUNGS:  Clear to auscultation bilaterally CHEST:  Unremarkable HEART:  PMI not displaced or sustained,S1 and S2 within normal limits, no S3, no S4, no clicks, no rubs, no murmurs ABD:  Flat, positive bowel sounds normal in frequency in pitch, no bruits, no rebound, no guarding, no midline pulsatile mass, no hepatomegaly, no splenomegaly EXT:  2 plus pulses throughout, no edema, no cyanosis no  clubbing   EKG:  EKG is  ordered today. Sinus rhythm, rate 55, axis within normal limits, intervals within normal limits, no acute ST-T wave changes.  Recent Labs: No results found for requested labs within last 8760 hours.     Wt Readings from Last 3 Encounters:  10/01/19 207 lb (93.9 kg)  10/15/18 203 lb 12.8 oz (92.4 kg)  07/15/18 202 lb (91.6 kg)      Other studies Reviewed: Additional studies/ records that were reviewed today include:  Lexiscan Myoview. Review of the above records demonstrates:      ASSESSMENT AND PLAN:  CAD:   The patient has no new sypmtoms.  No further cardiovascular testing is indicated.  We will continue with aggressive risk reduction and meds as listed.  His blood pressure is controlled.  He is not smoking.  I did discuss with him a Mediterranean diet and increased physical activity.  DYSLIPIDEMIA:     I do not have access to his most recent lipids but they are followed byBlomgren, Collier Salina, MD.   I will defer to his management.  COVID EDUCATION: He has signed up for the vaccine.  Current medicines are reviewed at length with the patient today.  The patient does not have concerns regarding medicines.  The following changes have been made:   None  Labs/ tests ordered today include:   None  Orders Placed This Encounter  Procedures  . EKG 12-Lead     Disposition:   FU with 18 months.      Signed, Minus Breeding, MD  10/01/2019 12:42 PM    [facility]

## 2019-10-01 ENCOUNTER — Ambulatory Visit: Payer: Medicare Other | Admitting: Cardiology

## 2019-10-01 ENCOUNTER — Encounter: Payer: Self-pay | Admitting: Cardiology

## 2019-10-01 ENCOUNTER — Other Ambulatory Visit: Payer: Self-pay

## 2019-10-01 VITALS — BP 116/62 | HR 55 | Ht 66.0 in | Wt 207.0 lb

## 2019-10-01 DIAGNOSIS — Z7189 Other specified counseling: Secondary | ICD-10-CM | POA: Diagnosis not present

## 2019-10-01 DIAGNOSIS — I2510 Atherosclerotic heart disease of native coronary artery without angina pectoris: Secondary | ICD-10-CM

## 2019-10-01 DIAGNOSIS — E785 Hyperlipidemia, unspecified: Secondary | ICD-10-CM | POA: Diagnosis not present

## 2019-10-01 NOTE — Patient Instructions (Signed)
Medication Instructions:  No Changes *If you need a refill on your cardiac medications before your next appointment, please call your pharmacy*  Lab Work: None If you have labs (blood work) drawn today and your tests are completely normal, you will receive your results only by: Marland Kitchen MyChart Message (if you have MyChart) OR . A paper copy in the mail If you have any lab test that is abnormal or we need to change your treatment, we will call you to review the results.  Testing/Procedures: None  Follow-Up: At [facility], you and your health needs are our priority.  As part of our continuing mission to provide you with exceptional heart care, we have created designated Provider Care Teams.  These Care Teams include your primary Cardiologist (physician) and Advanced Practice Providers (APPs -  Physician Assistants and Nurse Practitioners) who all work together to provide you with the care you need, when you need it.  Your next appointment:   18 month(s)  The format for your next appointment:   In Person  Provider:   Minus Breeding, MD

## 2019-10-14 ENCOUNTER — Ambulatory Visit: Payer: Medicare Other

## 2019-10-22 ENCOUNTER — Ambulatory Visit: Payer: Medicare Other | Attending: Internal Medicine

## 2019-10-22 DIAGNOSIS — Z23 Encounter for immunization: Secondary | ICD-10-CM | POA: Insufficient documentation

## 2019-10-22 NOTE — Progress Notes (Signed)
   Covid-19 Vaccination Clinic  Name:  Randy Peterson    MRN: [id number] DOB: 1946-02-29  10/22/2019  Mr. [name] was observed post Covid-19 immunization for 15 minutes without incidence. He was provided with Vaccine Information Sheet and instruction to access the V-Safe system.   [name] was instructed to call 911 with any severe reactions post vaccine: Marland Kitchen Difficulty breathing  . Swelling of your face and throat  . A fast heartbeat  . A bad rash all over your body  . Dizziness and weakness    Immunizations Administered    Name Date Dose VIS Date Route   Pfizer COVID-19 Vaccine 10/22/2019  2:54 PM 0.3 mL 08/27/2019 Intramuscular   Manufacturer: [place]   Lot: [id number]   [place]: [id number]

## 2019-10-27 ENCOUNTER — Ambulatory Visit (INDEPENDENT_AMBULATORY_CARE_PROVIDER_SITE_OTHER): Payer: Medicare Other | Admitting: Psychiatry

## 2019-10-27 ENCOUNTER — Encounter (HOSPITAL_COMMUNITY): Payer: Self-pay | Admitting: Psychiatry

## 2019-10-27 ENCOUNTER — Other Ambulatory Visit: Payer: Self-pay

## 2019-10-27 DIAGNOSIS — F330 Major depressive disorder, recurrent, mild: Secondary | ICD-10-CM

## 2019-10-27 DIAGNOSIS — F419 Anxiety disorder, unspecified: Secondary | ICD-10-CM

## 2019-10-27 MED ORDER — DULOXETINE HCL 20 MG PO CPEP: 20.0000 mg | ORAL_CAPSULE | Freq: Every day | ORAL | 0 refills | Status: DC

## 2019-10-27 MED ORDER — BUPROPION HCL ER (XL) 150 MG PO TB24: 150.0000 mg | ORAL_TABLET | Freq: Every day | ORAL | 0 refills | Status: DC

## 2019-10-27 NOTE — Progress Notes (Signed)
Virtual Visit via Telephone Note  I connected with Si Raider on 10/27/19 at  1:20 PM EST by telephone and verified that I am speaking with the correct person using two identifiers.   I discussed the limitations, risks, security and privacy concerns of performing an evaluation and management service by telephone and the availability of in person appointments. I also discussed with the patient that there may be a patient responsible charge related to this service. The patient expressed understanding and agreed to proceed.   History of Present Illness: Patient was evaluated by phone session.  He is a stable on Cymbalta and Wellbutrin.  He has no tremors shakes or any EPS.  He is happy because his daughter is getting married in October.  These days he is busy with his wife for upcoming event in October.  He admitted not able to go outside because of pandemic and may have gained 3 pounds from the last visit.  But is happy that he got first dose of vaccine and hoping to get second one soon.  Denies any crying spells, irritability, feeling of hopelessness or worthlessness.  His energy level is good.  He is sleeping better.  He wants to continue the combination of Cymbalta and Wellbutrin.    Past Psychiatric History:Reviewed. H/Oinpatient in 2056forsuicidal attemptandcut wrist.Depressed because lost job. TookPaxil, Risperdal and given Vyvanseforattention and focus. Cymbalta and Wellbutrin helped.     Psychiatric Specialty Exam: Physical Exam  Review of Systems  There were no vitals taken for this visit.There is no height or weight on file to calculate BMI.  General Appearance: NA  Eye Contact:  NA  Speech:  Normal Rate  Volume:  Normal  Mood:  Euthymic  Affect:  NA  Thought Process:  Goal Directed  Orientation:  Full (Time, Place, and Person)  Thought Content:  WDL  Suicidal Thoughts:  No  Homicidal Thoughts:  No  Memory:  Immediate;   Good  Judgement:  Good  Insight:   Present  Psychomotor Activity:  NA  Concentration:  Concentration: Good and Attention Span: Good  Recall:  Good  Fund of Knowledge:  Good  Language:  Good  Akathisia:  No  Handed:  Right  AIMS (if indicated):     Assets:  Communication Skills Desire for Improvement Housing Resilience Social Support Transportation  ADL's:  Intact  Cognition:  WNL  Sleep:   ok      Assessment and Plan: Major depressive disorder, recurrent.  Anxiety.  Patient is a stable on his current medication.  He has no side effects.  Encourage healthy lifestyle and watch his calorie intake.  Continue Cymbalta 20 mg daily and Wellbutrin XL 150 mg daily.  Recommended to call us back if he has any question of any concern.  Follow-up in 3 months.  Follow Up Instructions:    I discussed the assessment and treatment plan with the patient. The patient was provided an opportunity to ask questions and all were answered. The patient agreed with the plan and demonstrated an understanding of the instructions.   The patient was advised to call back or seek an in-person evaluation if the symptoms worsen or if the condition fails to improve as anticipated.  I provided 15 minutes of non-face-to-face time during this encounter.   Kathlee Nations, MD

## 2019-10-31 ENCOUNTER — Ambulatory Visit: Payer: Medicare Other

## 2019-11-16 ENCOUNTER — Ambulatory Visit: Payer: Medicare Other | Attending: Internal Medicine

## 2019-11-16 DIAGNOSIS — Z23 Encounter for immunization: Secondary | ICD-10-CM | POA: Insufficient documentation

## 2019-11-16 NOTE — Progress Notes (Signed)
   Covid-19 Vaccination Clinic  Name:  Randy Peterson    MRN: [id number] DOB: [birth year]  11/16/2019  Randy Peterson was observed post Covid-19 immunization for 15 minutes without incident. He was provided with Vaccine Information Sheet and instruction to access the V-Safe system.   Randy Peterson was instructed to call 911 with any severe reactions post vaccine: Marland Kitchen Difficulty breathing  . Swelling of face and throat  . A fast heartbeat  . A bad rash all over body  . Dizziness and weakness   Immunizations Administered    Name Date Dose VIS Date Route   Pfizer COVID-19 Vaccine 11/16/2019  2:50 PM 0.3 mL 08/27/2019 Intramuscular   Manufacturer: [place]   Lot: [id number]   [place]: [id number]

## 2019-12-13 ENCOUNTER — Ambulatory Visit: Payer: Medicare Other | Admitting: Podiatry

## 2019-12-13 ENCOUNTER — Encounter: Payer: Self-pay | Admitting: Podiatry

## 2019-12-13 ENCOUNTER — Other Ambulatory Visit: Payer: Self-pay

## 2019-12-13 VITALS — Temp 97.2°F

## 2019-12-13 DIAGNOSIS — L600 Ingrowing nail: Secondary | ICD-10-CM

## 2019-12-13 MED ORDER — NEOMYCIN-POLYMYXIN-HC 3.5-10000-1 OT SOLN: OTIC | 0 refills | Status: DC

## 2019-12-13 NOTE — Patient Instructions (Signed)

## 2019-12-15 NOTE — Progress Notes (Signed)
Subjective:   Patient ID: Randy Peterson, male   DOB: 74 y.o.   MRN: [id number]   HPI Patient presents stating he has trouble with both of his big toenails and they are very thick they are impossible for him to cut they become painful he is tried oral antifungal's without any success and is tried topicals over the years.  Patient does not smoke likes to be active   Review of Systems  All other systems reviewed and are negative.       Objective:  Physical Exam Vitals and nursing note reviewed.  Constitutional:      Appearance: He is well-developed.  Pulmonary:     Effort: Pulmonary effort is normal.  Musculoskeletal:        General: Normal range of motion.  Skin:    General: Skin is warm.  Neurological:     Mental Status: He is alert.     Neurovascular status intact muscle strength found to be adequate range of motion within normal limits.  Patient is found to have severely thickened hallux nails bilateral which are dystrophic painful and impossible for him to cut.  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  At this point surgical intervention is recommended and patient wants surgery understanding risk and today I went ahead and I allowed him to sign consent form and I went ahead I infiltrated each hallux 60 mg Xylocaine Marcaine mixture I removed the hallux nails exposed matrix and applied phenol 5 applications 30 seconds followed by alcohol lavage sterile dressing.  Gave instructions on soaks and to leave dressings on 24 hours but take them off early if any throbbing were to occur and wrote prescription for drops and encouraged patient to call with any questions concerns which may arise during the postoperative.

## 2019-12-31 ENCOUNTER — Other Ambulatory Visit: Payer: Self-pay | Admitting: Neurosurgery

## 2019-12-31 ENCOUNTER — Telehealth: Payer: Self-pay | Admitting: Cardiology

## 2019-12-31 NOTE — Telephone Encounter (Signed)
   [facility] Pre-operative Risk Assessment    Request for surgical clearance:  1. What type of surgery is being performed? Jan 24, 2020   2. When is this surgery scheduled? L3-4, L4-5 lumbar laminectomy    3. What type of clearance is required (medical clearance vs. Pharmacy clearance to hold med vs. Both)? Both   4. Are there any medications that need to be held prior to surgery and how long? None specified - on ASA  5. Practice name and name of physician performing surgery? Dr. Kary Kos with [facility]    6. What is your office phone number [phone]   7.   What is your office fax number [phone] Attn: Lorriane Shire  8.   Anesthesia type (None, local, MAC, general) ? General    Randy Peterson 12/31/2019, 11:37 AM  _________________________________________________________________   (provider comments below)

## 2019-12-31 NOTE — Telephone Encounter (Signed)
Hi Dr. Percival Spanish,   Randy Peterson has lumbar laminectomy scheduled for 01/24/2020. Can you please comment on how long he can hold Aspirin prior to procedure? He has a history of CAD s/p DES to RCA in 04/2015. Myoview in 06/2018 was low risk with no signs of ischemia. You last saw patient in 09/2019 and he was doing well at that time.  Please route response back to P CV DIV PREOP.  Thank you!

## 2019-12-31 NOTE — Telephone Encounter (Signed)
If they cannot do the procedure on ASA I would suggest holding for 3 days prior to surgery.

## 2020-01-14 NOTE — Telephone Encounter (Signed)
   Primary Cardiologist: Minus Breeding, MD  Chart reviewed as part of pre-operative protocol coverage. Patient was contacted 01/14/2020 in reference to pre-operative risk assessment for pending surgery as outlined below.  Laurance Pluim was last seen on 10/01/19 by Dr. Percival Spanish.  Since that day, Kia Gush has done fine from a cardiac standpoint. His back pain has limited him some in activity, though he can still complete 4 METs without anginal complaints.  Therefore, based on ACC/AHA guidelines, the patient would be at acceptable risk for the planned procedure without further cardiovascular testing.   Per Dr. Percival Spanish, if the procedure cannot be done on aspirin, he can hold 3 days prior to his surgery with plans to restart when cleared to do so by Dr. Saintclair Halsted.  I will route this recommendation to the requesting party via Epic fax function and remove from pre-op pool.  Please call with questions.  Abigail Butts, PA-C 01/14/2020, 5:11 PM

## 2020-01-19 NOTE — Pre-Procedure Instructions (Signed)
Randy Peterson  01/19/2020      [facility] [name], [address] AT [facility] OF [address] [place] [address] Phone: [phone] Fax: [phone]    Your procedure is scheduled on   Monday 01/24/20.  Report to [facility] Admitting at 9 A.M.  Call this number if you have problems the morning of surgery:  [phone]   Remember:  Do not eat or drink after midnight.      Take these medicines the morning of surgery with A SIP OF WATER ----TYLENOL,WELLBUTRIN,CYMBALTA,LOPRESSOR,PRILOSEC   Do not take any aspirin,anti-inflammatories,vitamins,or herbal supplements 5-7 days prior to surgery.  Do not wear jewelry, make-up or nail polish.  Do not wear lotions, powders, or perfumes, or deodorant.  Do not shave 48 hours prior to surgery.  Men may shave face and neck.  Do not bring valuables to the hospital.  [facility] is not responsible for any belongings or valuables.  Contacts, dentures or bridgework may not be worn into surgery.  Leave your suitcase in the car.  After surgery it may be brought to your room.  For patients admitted to the hospital, discharge time will be determined by your treatment team.  Patients discharged the day of surgery will not be allowed to drive home.   - Preparing for Surgery  Before surgery, you can play an important role.  Because skin is not sterile, your skin needs to be as free of germs as possible.  You can reduce the number of germs on you skin by washing with CHG (chlorahexidine gluconate) soap before surgery.  CHG is an antiseptic cleaner which kills germs and bonds with the skin to continue killing germs even after washing.  Oral Hygiene is also important in reducing the risk of infection.  Remember to brush your teeth with your regular toothpaste the morning of surgery.  Please DO NOT use if you have an allergy to CHG or antibacterial soaps.  If your skin  becomes reddened/irritated stop using the CHG and inform your nurse when you arrive at Short Stay.  Do not shave (including legs and underarms) for at least 48 hours prior to the first CHG shower.  You may shave your face.  Please follow these instructions carefully:   1.  Shower with CHG Soap the night before surgery and the morning of Surgery.  2.  If you choose to wash your hair, wash your hair first as usual with your normal shampoo.  3.  After you shampoo, rinse your hair and body thoroughly to remove the shampoo. 4.  Use CHG as you would any other liquid soap.  You can apply chg directly to the skin and wash gently with a      scrungie or washcloth.           5.  Apply the CHG Soap to your body ONLY FROM THE NECK DOWN.   Do not use on open wounds or open sores. Avoid contact with your eyes, ears, mouth and genitals (private parts).  Wash genitals (private parts) with your normal soap.  6.  Wash thoroughly, paying special attention to the area where your surgery will be performed.  7.  Thoroughly rinse your body with warm water from the neck down.  8.  DO NOT shower/wash with your normal soap after using and rinsing off the CHG Soap.  9.  Pat yourself dry with a clean towel.  10.  Wear clean pajamas.            11.  Place clean sheets on your bed the night of your first shower and do not sleep with pets.  Day of Surgery  Do not apply any lotions/deoderants the morning of surgery.   Please wear clean clothes to the hospital/surgery center. Remember to brush your teeth with toothpaste.      Please read over the following fact sheets that you were given. Coughing and Deep Breathing

## 2020-01-20 ENCOUNTER — Other Ambulatory Visit (HOSPITAL_COMMUNITY): Payer: Medicare Other

## 2020-01-20 ENCOUNTER — Encounter (HOSPITAL_COMMUNITY): Payer: Self-pay

## 2020-01-20 ENCOUNTER — Encounter (HOSPITAL_COMMUNITY)
Admission: RE | Admit: 2020-01-20 | Discharge: 2020-01-20 | Disposition: A | Discharge status: Home / Self Care | Payer: Medicare Other | Source: Ambulatory Visit | Attending: Neurosurgery | Admitting: Neurosurgery

## 2020-01-20 ENCOUNTER — Other Ambulatory Visit: Payer: Self-pay

## 2020-01-20 DIAGNOSIS — F419 Anxiety disorder, unspecified: Secondary | ICD-10-CM | POA: Insufficient documentation

## 2020-01-20 DIAGNOSIS — I2510 Atherosclerotic heart disease of native coronary artery without angina pectoris: Secondary | ICD-10-CM | POA: Diagnosis not present

## 2020-01-20 DIAGNOSIS — N400 Enlarged prostate without lower urinary tract symptoms: Secondary | ICD-10-CM | POA: Diagnosis not present

## 2020-01-20 DIAGNOSIS — E785 Hyperlipidemia, unspecified: Secondary | ICD-10-CM | POA: Insufficient documentation

## 2020-01-20 DIAGNOSIS — Z7982 Long term (current) use of aspirin: Secondary | ICD-10-CM | POA: Diagnosis not present

## 2020-01-20 DIAGNOSIS — Z87891 Personal history of nicotine dependence: Secondary | ICD-10-CM | POA: Insufficient documentation

## 2020-01-20 DIAGNOSIS — F329 Major depressive disorder, single episode, unspecified: Secondary | ICD-10-CM | POA: Diagnosis not present

## 2020-01-20 DIAGNOSIS — Z79899 Other long term (current) drug therapy: Secondary | ICD-10-CM | POA: Diagnosis not present

## 2020-01-20 DIAGNOSIS — K219 Gastro-esophageal reflux disease without esophagitis: Secondary | ICD-10-CM | POA: Diagnosis not present

## 2020-01-20 DIAGNOSIS — I252 Old myocardial infarction: Secondary | ICD-10-CM | POA: Insufficient documentation

## 2020-01-20 DIAGNOSIS — Z01818 Encounter for other preprocedural examination: Secondary | ICD-10-CM | POA: Insufficient documentation

## 2020-01-20 DIAGNOSIS — Z955 Presence of coronary angioplasty implant and graft: Secondary | ICD-10-CM | POA: Insufficient documentation

## 2020-01-20 HISTORY — DX: Unspecified osteoarthritis, unspecified site: M19.90

## 2020-01-20 LAB — BASIC METABOLIC PANEL
Anion gap: 8 (ref 5–15)
BUN: 15 mg/dL (ref 8–23)
CO2: 27 mmol/L (ref 22–32)
Calcium: 9.5 mg/dL (ref 8.9–10.3)
Chloride: 103 mmol/L (ref 98–111)
Creatinine, Ser: 1.14 mg/dL (ref 0.61–1.24)
GFR calc Af Amer: >60 mL/min (ref >60)
GFR calc non Af Amer: >60 mL/min (ref >60)
Glucose, Bld: 111 mg/dL — ABNORMAL HIGH (ref 70–99)
Potassium: 4.5 mmol/L (ref 3.5–5.1)
Sodium: 138 mmol/L (ref 135–145)

## 2020-01-20 LAB — CBC
HCT: 44.6 % (ref 39.0–52.0)
Hemoglobin: 14.3 g/dL (ref 13.0–17.0)
MCH: 30 pg (ref 26.0–34.0)
MCHC: 32.1 g/dL (ref 30.0–36.0)
MCV: 93.5 fL (ref 80.0–100.0)
Platelets: 203 10*3/uL (ref 150–400)
RBC: 4.77 MIL/uL (ref 4.22–5.81)
RDW: 13.2 % (ref 11.5–15.5)
WBC: 6 10*3/uL (ref 4.0–10.5)
nRBC: 0 % (ref 0.0–0.2)

## 2020-01-20 LAB — SURGICAL PCR SCREEN
MRSA, PCR: NEGATIVE
Staphylococcus aureus: NEGATIVE

## 2020-01-20 NOTE — Progress Notes (Signed)
PCP - Derinda Late, MD Cardiologist - Minus Breeding, MD  PPM/ICD - Denies  Chest x-ray - N/A EKG - 10/04/19 Stress Test - 07/15/18 ECHO - 05/17/15 Cardiac Cath - 05/17/15  Sleep Study - Denies  Patient denies being a diabetic.  Blood Thinner Instructions: N/A Aspirin Instructions: Per patient, stop 3 days prior to sx, however, pt last dose was 01/19/20.  ERAS Protcol - N/A PRE-SURGERY Ensure or G2- N/A  COVID TEST- Saturday 01/22/20 @ 12 noon @ [facility].   Anesthesia review: Yes, Cardiac hx.  Patient denies shortness of breath, fever, cough and chest pain at PAT appointment   All instructions explained to the patient, with a verbal understanding of the material. Patient agrees to go over the instructions while at home for a better understanding. Patient also instructed to self quarantine after being tested for COVID-19. The opportunity to ask questions was provided.

## 2020-01-20 NOTE — Pre-Procedure Instructions (Signed)
[facility] STORE [place], [place] AT [address] [place] [address] Phone: [phone] Fax: [phone]     Your procedure is scheduled on Monday, May 10 from 10:58 AM- 1:08 PM.  Report to [facility] Main Entrance "A" at 09:00 A.M., and check in at the Admitting office.  Call this number if you have problems the morning of surgery:  [phone]  Call [phone] if you have any questions prior to your surgery date Monday-Friday 8am-4pm.    Remember:  Do not eat or drink after midnight the night before your surgery.     Take these medicines the morning of surgery with A SIP OF WATER : buPROPion (WELLBUTRIN XL) DULoxetine (CYMBALTA)  metoprolol tartrate (LOPRESSOR) omeprazole (PRILOSEC)  IF NEEDED: acetaminophen (TYLENOL) nitroGLYCERIN (NITROSTAT)  Follow your surgeon's instructions on when to stop Aspirin.  If no instructions were given by your surgeon then you will need to call the office to get those instructions.    As of today, STOP taking any Aspirin containing products, Aleve, Naproxen, Ibuprofen, Motrin, Advil, Goody's, BC's, all herbal medications, fish oil, and all vitamins.                      Do not wear jewelry.            Do not wear lotions, powders, colognes, or deodorant.            Men may shave face and neck.            Do not bring valuables to the hospital.            [facility] is not responsible for any belongings or valuables.  Do NOT Smoke (Tobacco/Vapping) or drink Alcohol 24 hours prior to your procedure.  If you use a CPAP at night, you may bring all equipment for your overnight stay.   Contacts, glasses, dentures or bridgework may not be worn into surgery.      For patients admitted to the hospital, discharge time will be determined by your treatment team.   Patients discharged the day of surgery will not be allowed to drive home, and someone needs to stay with  them for 24 hours.    Special instructions:   [place]- Preparing For Surgery  Before surgery, you can play an important role. Because skin is not sterile, your skin needs to be as free of germs as possible. You can reduce the number of germs on your skin by washing with CHG (chlorahexidine gluconate) Soap before surgery.  CHG is an antiseptic cleaner which kills germs and bonds with the skin to continue killing germs even after washing.    Oral Hygiene is also important to reduce your risk of infection.  Remember - BRUSH YOUR TEETH THE MORNING OF SURGERY WITH YOUR REGULAR TOOTHPASTE  Please do not use if you have an allergy to CHG or antibacterial soaps. If your skin becomes reddened/irritated stop using the CHG.  Do not shave (including legs and underarms) for at least 48 hours prior to first CHG shower. It is OK to shave your face.  Please follow these instructions carefully.   1. Shower the NIGHT BEFORE SURGERY and the MORNING OF SURGERY with CHG Soap.   2. If you chose to wash your hair, wash your hair first as usual with your normal shampoo.  3. After you shampoo, rinse your hair and body thoroughly  to remove the shampoo.  4. Use CHG as you would any other liquid soap. You can apply CHG directly to the skin and wash gently with a scrungie or a clean washcloth.   5. Apply the CHG Soap to your body ONLY FROM THE NECK DOWN.  Do not use on open wounds or open sores. Avoid contact with your eyes, ears, mouth and genitals (private parts). Wash Face and genitals (private parts)  with your normal soap.   6. Wash thoroughly, paying special attention to the area where your surgery will be performed.  7. Thoroughly rinse your body with warm water from the neck down.  8. DO NOT shower/wash with your normal soap after using and rinsing off the CHG Soap.  9. Pat yourself dry with a CLEAN TOWEL.  10. Wear CLEAN PAJAMAS to bed the night before surgery, wear comfortable clothes the morning  of surgery  11. Place CLEAN SHEETS on your bed the night of your first shower and DO NOT SLEEP WITH PETS.   Day of Surgery:   Do not apply any deodorants/lotions.  Please wear clean clothes to the hospital/surgery center.   Remember to brush your teeth WITH YOUR REGULAR TOOTHPASTE.   Please read over the following fact sheets that you were given.

## 2020-01-21 ENCOUNTER — Encounter (HOSPITAL_COMMUNITY): Payer: Self-pay | Admitting: Psychiatry

## 2020-01-21 ENCOUNTER — Telehealth (INDEPENDENT_AMBULATORY_CARE_PROVIDER_SITE_OTHER): Payer: Medicare Other | Admitting: Psychiatry

## 2020-01-21 DIAGNOSIS — F419 Anxiety disorder, unspecified: Secondary | ICD-10-CM | POA: Diagnosis not present

## 2020-01-21 DIAGNOSIS — F330 Major depressive disorder, recurrent, mild: Secondary | ICD-10-CM | POA: Diagnosis not present

## 2020-01-21 MED ORDER — DULOXETINE HCL 20 MG PO CPEP: 20.0000 mg | ORAL_CAPSULE | Freq: Every day | ORAL | 0 refills | Status: DC

## 2020-01-21 MED ORDER — BUPROPION HCL ER (XL) 150 MG PO TB24: 150.0000 mg | ORAL_TABLET | Freq: Every day | ORAL | 0 refills | Status: DC

## 2020-01-21 NOTE — Progress Notes (Signed)
Anesthesia Chart Review:  Case: [id number] Date/Time: 01/24/20 1043   Procedure: Laminectomy and Foraminotomy - bilateral - L3-L4 - L4-L5 (Bilateral Back) - 3C   Anesthesia type: General   Pre-op diagnosis: Stenosis   Location: [place] OR ROOM 19 / [place] OR   Surgeons: Kary Kos, MD      DISCUSSION: Patient is a 74 year old male scheduled for the above procedure.  History includes former smoker, CAD (inferior MI, s/p DES RCA 05/17/15), HLD, GERD, anxiety, depression (suicide attempt 07/13/07), BPH. BMI is consistent with obesity.   Preoperative cardiology input outlined on 12/31/19 by Roby Lofts, PA-C, "...Randy Peterson was last seen on 10/01/19 by Dr. Percival Spanish.  Since that day, Randy Peterson has done fine from a cardiac standpoint. His back pain has limited him some in activity, though he can still complete 4 METs without anginal complaints.  Therefore, based on ACC/AHA guidelines, the patient would be at acceptable risk for the planned procedure without further cardiovascular testing.   Per Dr. Percival Spanish, if the procedure cannot be done on aspirin, he can hold 3 days prior to his surgery with plans to restart when cleared to do so by Dr. Saintclair Halsted."  He reported last ASA dose 01/19/20. Presurgical COVID-19 test is scheduled for 01/22/20.  Anesthesia team to evaluate on the day of surgery.   VS: BP 132/85   Pulse 61   Temp 37.1 C (Oral)   Resp 18   Ht 5\' 6"  (1.676 m)   Wt 92.7 kg   SpO2 99%   BMI 32.99 kg/m    PROVIDERS: Derinda Late, MD is PCP  Minus Breeding, MD is cardiologist. Last evaluation 10/01/19 with 18 month follow-up recommended. Berniece Andreas, MD is psychiatrist   LABS: Labs reviewed: Acceptable for surgery. (all labs ordered are listed, but only abnormal results are displayed)  Labs Reviewed  BASIC METABOLIC PANEL - Abnormal; Notable for the following components:      Result Value   Glucose, Bld 111 (*)    All other components within normal limits  SURGICAL PCR  SCREEN  CBC     EKG: 10/04/19: SB at 55 bpm    CV: Nuclear stress test 07/15/18:  Nuclear stress EF: 62%. The left ventricular ejection fraction is normal (55-65%).  There was no ST segment deviation noted during stress.  Defect 1: There is a small defect of moderate severity present in the basal inferior and mid inferior location.  Findings consistent with prior mid-basilar Inferior wall myocardial infarction. There is no ischmeia  This is a low risk study.   Echo 05/17/15: Study Conclusions  - Left ventricle: The cavity size was normal. Wall thickness was  normal. Systolic function was normal. The estimated ejection  fraction was in the range of 50% to 55%. Wall motion was normal;  there were no regional wall motion abnormalities. Features are  consistent with a pseudonormal left ventricular filling pattern,  with concomitant abnormal relaxation and increased filling  pressure (grade 2 diastolic dysfunction).  - Mitral valve: There was mild regurgitation.  - Right ventricle: The cavity size was mildly dilated. Systolic  function was mildly to moderately reduced.  - Pulmonary arteries: Systolic pressure was mildly increased. PA  peak pressure: 33 mm Hg (S).    Cardiac cath 05/17/15:  1st Diag lesion, 60% stenosed.  Prox LAD lesion, 25% stenosed.  Prox RCA lesion, 100% stenosed. There is a 0% residual stenosis post intervention.  A drug-eluting stent was placed.  There is mild left ventricular  systolic dysfunction. Final conclusions: 1. Acute myocardial infarction with total occlusion of the RCA, treated successfully with PCI using a drug-eluting stent and adjunct of aspiration thrombectomy 2. Moderate stenosis of the diagonal branch of the LAD 3. Widely patent left main, left circumflex, and LAD proper 4. Mild segmental contraction abnormality left ventricle with inferior wall akinesis and hyperdynamic function of the anterolateral wall, LVEF  estimated at 50-55% Recommendations: Dual antiplatelets therapy with aspirin and brilinta for at least 12 months.   Past Medical History:  Diagnosis Date  . Acid reflux   . Acute MI, inferior wall, initial episode of care ([place]) 05/17/15  . Angina decubitus ([place]) 05/16/15  . Anxiety   . Arthritis    both shoulders and back  . CAD in native artery    per cath 05/16/14  . Depression   . Hyperlipidemia LDL goal <70   . Prostate enlargement   . S/P coronary artery stent placement, DES promus premier, RCA 05/17/15 05/18/2015    Past Surgical History:  Procedure Laterality Date  . CARDIAC CATHETERIZATION N/A 05/17/2015   Procedure: Left Heart Cath and Coronary Angiography;  Surgeon: Sherren Mocha, MD;  Location: [facility];  Service: Cardiovascular;  Laterality: N/A;  . CARDIAC CATHETERIZATION N/A 05/17/2015   Procedure: Coronary Stent Intervention;  Surgeon: Sherren Mocha, MD;  Location: [facility];  Service: Cardiovascular;  Laterality: N/A;  . None      MEDICATIONS: . acetaminophen (TYLENOL) 325 MG tablet  . aspirin EC 81 MG tablet  . atorvastatin (LIPITOR) 80 MG tablet  . buPROPion (WELLBUTRIN XL) 150 MG 24 hr tablet  . cholecalciferol (VITAMIN D) 1000 UNITS tablet  . DULoxetine (CYMBALTA) 20 MG capsule  . ezetimibe (ZETIA) 10 MG tablet  . finasteride (PROSCAR) 5 MG tablet  . metoprolol tartrate (LOPRESSOR) 25 MG tablet  . neomycin-polymyxin-hydrocortisone (CORTISPORIN) OTIC solution  . nitroGLYCERIN (NITROSTAT) 0.4 MG SL tablet  . omeprazole (PRILOSEC) 20 MG capsule  . tamsulosin (FLOMAX) 0.4 MG CAPS capsule   No current facility-administered medications for this encounter.    [name], PA-C Surgical Short Stay/Anesthesiology [facility] Phone [phone] [facility] Phone [phone] 01/21/2020 12:39 PM

## 2020-01-21 NOTE — Anesthesia Preprocedure Evaluation (Addendum)
Anesthesia Evaluation  Patient identified by MRN, date of birth, ID band Patient awake    Reviewed: Allergy & Precautions, NPO status , Patient's Chart, lab work & pertinent test results  Airway Mallampati: I  TM Distance: >3 FB Neck ROM: Full    Dental  (+) Teeth Intact, Dental Advisory Given   Pulmonary former smoker,    Pulmonary exam normal        Cardiovascular + CAD, + Past MI and + Cardiac Stents  Normal cardiovascular exam     Neuro/Psych Anxiety Depression    GI/Hepatic GERD  Controlled,  Endo/Other    Renal/GU      Musculoskeletal   Abdominal   Peds  Hematology   Anesthesia Other Findings   Reproductive/Obstetrics                            Anesthesia Physical Anesthesia Plan  ASA: III  Anesthesia Plan: General   Post-op Pain Management:    Induction: Intravenous  PONV Risk Score and Plan: 2 and Ondansetron and Midazolam  Airway Management Planned: LMA  Additional Equipment:   Intra-op Plan:   Post-operative Plan: Extubation in OR  Informed Consent: I have reviewed the patients History and Physical, chart, labs and discussed the procedure including the risks, benefits and alternatives for the proposed anesthesia with the patient or authorized representative who has indicated his/her understanding and acceptance.       Plan Discussed with: CRNA and Surgeon  Anesthesia Plan Comments: (PAT note written 01/21/2020 by [name], PA-C. )       Anesthesia Quick Evaluation

## 2020-01-21 NOTE — Progress Notes (Signed)
Virtual Visit via Telephone Note  I connected with Randy Peterson on 01/21/20 at 10:40 AM EDT by telephone and verified that I am speaking with the correct person using two identifiers.   I discussed the limitations, risks, security and privacy concerns of performing an evaluation and management service by telephone and the availability of in person appointments. I also discussed with the patient that there may be a patient responsible charge related to this service. The patient expressed understanding and agreed to proceed.   History of Present Illness: Patient is evaluated by phone session.  He is taking Cymbalta and Wellbutrin.  He feels the current medicine is working and he denies any crying spells, feeling of hopelessness or worthlessness.  He received COVID vaccine and he was happy that he was able to make a trip to the beach with the family and had a good time.  His daughter is getting married in October and he is excited.  He denies any irritability, anger, mania or any psychosis.  He is sleeping good.  He has upcoming back surgery but it will be outpatient.  Recently he had blood work and it is within normal limit.  Patient has no tremors, shakes or any EPS.  He like to continue Wellbutrin and low-dose Cymbalta.   Past Psychiatric History:Reviewed. H/Oinpatient in 2031forsuicidal attemptandcut wrist.Depressed because lost job. TookPaxil, Risperdal and given Vyvanseforattention and focus. Cymbalta and Wellbutrin helped.    Psychiatric Specialty Exam: Physical Exam  Review of Systems  There were no vitals taken for this visit.There is no height or weight on file to calculate BMI.  General Appearance: NA  Eye Contact:  NA  Speech:  Clear and Coherent  Volume:  Normal  Mood:  Euthymic  Affect:  NA  Thought Process:  Goal Directed  Orientation:  Full (Time, Place, and Person)  Thought Content:  Logical  Suicidal Thoughts:  No  Homicidal Thoughts:  No  Memory:   Immediate;   Good Recent;   Good Remote;   Good  Judgement:  Good  Insight:  Present  Psychomotor Activity:  NA  Concentration:  Concentration: Good and Attention Span: Good  Recall:  Good  Fund of Knowledge:  Good  Language:  Good  Akathisia:  No  Handed:  Right  AIMS (if indicated):     Assets:  Communication Skills Desire for Improvement Housing Resilience Social Support Transportation  ADL's:  Intact  Cognition:  WNL  Sleep:   good      Assessment and Plan: Major depressive disorder, recurrent.  Anxiety.  I reviewed blood work results.  His CBC and chemistry is normal.  Patient is a stable on his current medication.  I will continue Cymbalta 20 mg daily and Wellbutrin XL 150 mg daily.  Recommended to call us back if is any question or any concerns.  Follow-up in 3 months.  Follow Up Instructions:    I discussed the assessment and treatment plan with the patient. The patient was provided an opportunity to ask questions and all were answered. The patient agreed with the plan and demonstrated an understanding of the instructions.   The patient was advised to call back or seek an in-person evaluation if the symptoms worsen or if the condition fails to improve as anticipated.  I provided 15 minutes of non-face-to-face time during this encounter.   Kathlee Nations, MD

## 2020-01-22 ENCOUNTER — Other Ambulatory Visit (HOSPITAL_COMMUNITY)
Admission: RE | Admit: 2020-01-22 | Discharge: 2020-01-22 | Disposition: A | Discharge status: Home / Self Care | Payer: Medicare Other | Source: Ambulatory Visit | Attending: Neurosurgery | Admitting: Neurosurgery

## 2020-01-22 DIAGNOSIS — Z20822 Contact with and (suspected) exposure to covid-19: Secondary | ICD-10-CM | POA: Insufficient documentation

## 2020-01-22 DIAGNOSIS — Z01812 Encounter for preprocedural laboratory examination: Secondary | ICD-10-CM | POA: Insufficient documentation

## 2020-01-22 LAB — SARS CORONAVIRUS 2 (TAT 6-24 HRS): SARS Coronavirus 2: NEGATIVE

## 2020-01-24 ENCOUNTER — Encounter (HOSPITAL_COMMUNITY): Payer: Self-pay | Admitting: Neurosurgery

## 2020-01-24 ENCOUNTER — Ambulatory Visit (HOSPITAL_COMMUNITY): Payer: Medicare Other

## 2020-01-24 ENCOUNTER — Ambulatory Visit (HOSPITAL_COMMUNITY): Payer: Medicare Other | Admitting: Certified Registered Nurse Anesthetist

## 2020-01-24 ENCOUNTER — Ambulatory Visit (HOSPITAL_COMMUNITY): Payer: Medicare Other | Admitting: Psychiatry

## 2020-01-24 ENCOUNTER — Encounter (HOSPITAL_COMMUNITY)
Admission: RE | Disposition: A | Discharge status: Home / Self Care | Payer: Self-pay | Source: Home / Self Care | Attending: Neurosurgery

## 2020-01-24 ENCOUNTER — Ambulatory Visit (HOSPITAL_COMMUNITY): Payer: Medicare Other | Admitting: Physician Assistant

## 2020-01-24 ENCOUNTER — Other Ambulatory Visit: Payer: Self-pay

## 2020-01-24 ENCOUNTER — Ambulatory Visit (HOSPITAL_COMMUNITY)
Admission: RE | Admit: 2020-01-24 | Discharge: 2020-01-25 | Disposition: A | Discharge status: Home / Self Care | Payer: Medicare Other | Attending: Neurosurgery | Admitting: Neurosurgery

## 2020-01-24 DIAGNOSIS — Z79899 Other long term (current) drug therapy: Secondary | ICD-10-CM | POA: Diagnosis not present

## 2020-01-24 DIAGNOSIS — Z419 Encounter for procedure for purposes other than remedying health state, unspecified: Secondary | ICD-10-CM

## 2020-01-24 DIAGNOSIS — I2510 Atherosclerotic heart disease of native coronary artery without angina pectoris: Secondary | ICD-10-CM | POA: Diagnosis not present

## 2020-01-24 DIAGNOSIS — N400 Enlarged prostate without lower urinary tract symptoms: Secondary | ICD-10-CM | POA: Diagnosis not present

## 2020-01-24 DIAGNOSIS — F329 Major depressive disorder, single episode, unspecified: Secondary | ICD-10-CM | POA: Insufficient documentation

## 2020-01-24 DIAGNOSIS — I252 Old myocardial infarction: Secondary | ICD-10-CM | POA: Diagnosis not present

## 2020-01-24 DIAGNOSIS — M48062 Spinal stenosis, lumbar region with neurogenic claudication: Secondary | ICD-10-CM | POA: Insufficient documentation

## 2020-01-24 DIAGNOSIS — F419 Anxiety disorder, unspecified: Secondary | ICD-10-CM | POA: Insufficient documentation

## 2020-01-24 DIAGNOSIS — M19011 Primary osteoarthritis, right shoulder: Secondary | ICD-10-CM | POA: Insufficient documentation

## 2020-01-24 DIAGNOSIS — Z955 Presence of coronary angioplasty implant and graft: Secondary | ICD-10-CM | POA: Diagnosis not present

## 2020-01-24 DIAGNOSIS — M479 Spondylosis, unspecified: Secondary | ICD-10-CM | POA: Diagnosis not present

## 2020-01-24 DIAGNOSIS — Z87891 Personal history of nicotine dependence: Secondary | ICD-10-CM | POA: Diagnosis not present

## 2020-01-24 DIAGNOSIS — M48061 Spinal stenosis, lumbar region without neurogenic claudication: Secondary | ICD-10-CM | POA: Diagnosis present

## 2020-01-24 DIAGNOSIS — K219 Gastro-esophageal reflux disease without esophagitis: Secondary | ICD-10-CM | POA: Insufficient documentation

## 2020-01-24 DIAGNOSIS — E785 Hyperlipidemia, unspecified: Secondary | ICD-10-CM | POA: Insufficient documentation

## 2020-01-24 DIAGNOSIS — Z7982 Long term (current) use of aspirin: Secondary | ICD-10-CM | POA: Diagnosis not present

## 2020-01-24 DIAGNOSIS — M5416 Radiculopathy, lumbar region: Secondary | ICD-10-CM | POA: Insufficient documentation

## 2020-01-24 DIAGNOSIS — M19012 Primary osteoarthritis, left shoulder: Secondary | ICD-10-CM | POA: Insufficient documentation

## 2020-01-24 HISTORY — PX: LUMBAR LAMINECTOMY/DECOMPRESSION MICRODISCECTOMY: SHX5026

## 2020-01-24 SURGERY — LUMBAR LAMINECTOMY/DECOMPRESSION MICRODISCECTOMY 2 LEVELS
Anesthesia: General | Site: Spine Lumbar | Laterality: Bilateral

## 2020-01-24 MED ORDER — THROMBIN 5000 UNITS EX SOLR
CUTANEOUS | Status: AC
  Filled 2020-01-24: qty 10000

## 2020-01-24 MED ORDER — LACTATED RINGERS IV SOLN: INTRAVENOUS | Status: DC

## 2020-01-24 MED ORDER — FENTANYL CITRATE (PF) 100 MCG/2ML IJ SOLN
INTRAMUSCULAR | Status: DC | PRN
  Administered 2020-01-24 (×2): 50 ug via INTRAVENOUS

## 2020-01-24 MED ORDER — CHLORHEXIDINE GLUCONATE 0.12 % MT SOLN
15.0000 mL | Freq: Once | OROMUCOSAL | Status: AC
  Administered 2020-01-24: 09:00:00 15 mL via OROMUCOSAL
  Filled 2020-01-24: qty 15

## 2020-01-24 MED ORDER — GLYCOPYRROLATE 0.2 MG/ML IJ SOLN
INTRAMUSCULAR | Status: DC | PRN
  Administered 2020-01-24 (×2): .1 mg via INTRAVENOUS

## 2020-01-24 MED ORDER — DEXAMETHASONE SODIUM PHOSPHATE 10 MG/ML IJ SOLN
INTRAMUSCULAR | Status: DC | PRN
  Administered 2020-01-24: 10 mg via INTRAVENOUS

## 2020-01-24 MED ORDER — FENTANYL CITRATE (PF) 250 MCG/5ML IJ SOLN
INTRAMUSCULAR | Status: AC
  Filled 2020-01-24: qty 5

## 2020-01-24 MED ORDER — OXYCODONE HCL 5 MG PO TABS
5.0000 mg | ORAL_TABLET | ORAL | Status: DC | PRN
  Administered 2020-01-24 (×2): 10 mg via ORAL
  Administered 2020-01-25: 5 mg via ORAL
  Filled 2020-01-24: qty 2
  Filled 2020-01-24: qty 1
  Filled 2020-01-24: qty 2

## 2020-01-24 MED ORDER — THROMBIN 5000 UNITS EX SOLR
CUTANEOUS | Status: DC | PRN
  Administered 2020-01-24 (×2): 5000 [IU] via TOPICAL

## 2020-01-24 MED ORDER — LIDOCAINE 2% (20 MG/ML) 5 ML SYRINGE
INTRAMUSCULAR | Status: AC
  Filled 2020-01-24: qty 5

## 2020-01-24 MED ORDER — SODIUM CHLORIDE 0.9% FLUSH: 3.0000 mL | INTRAVENOUS | Status: DC | PRN

## 2020-01-24 MED ORDER — BUPIVACAINE HCL (PF) 0.25 % IJ SOLN
INTRAMUSCULAR | Status: AC
  Filled 2020-01-24: qty 30

## 2020-01-24 MED ORDER — HYDROMORPHONE HCL 1 MG/ML IJ SOLN: 0.2500 mg | INTRAMUSCULAR | Status: DC | PRN

## 2020-01-24 MED ORDER — MEPERIDINE HCL 25 MG/ML IJ SOLN: 6.2500 mg | INTRAMUSCULAR | Status: DC | PRN

## 2020-01-24 MED ORDER — VITAMIN D 25 MCG (1000 UNIT) PO TABS
1000.0000 [IU] | ORAL_TABLET | Freq: Every day | ORAL | Status: DC
  Administered 2020-01-24 – 2020-01-25 (×2): 1000 [IU] via ORAL
  Filled 2020-01-24 (×2): qty 1

## 2020-01-24 MED ORDER — ORAL CARE MOUTH RINSE: 15.0000 mL | Freq: Once | OROMUCOSAL | Status: AC

## 2020-01-24 MED ORDER — PANTOPRAZOLE SODIUM 40 MG IV SOLR
40.0000 mg | Freq: Every day | INTRAVENOUS | Status: DC
Start: 2020-01-24 — End: 2020-01-24

## 2020-01-24 MED ORDER — ASPIRIN EC 81 MG PO TBEC
81.0000 mg | DELAYED_RELEASE_TABLET | Freq: Every day | ORAL | Status: DC
  Administered 2020-01-24 – 2020-01-25 (×2): 81 mg via ORAL
  Filled 2020-01-24 (×2): qty 1

## 2020-01-24 MED ORDER — CEFAZOLIN SODIUM-DEXTROSE 2-4 GM/100ML-% IV SOLN
2.0000 g | INTRAVENOUS | Status: AC
  Administered 2020-01-24: 2 g via INTRAVENOUS
  Filled 2020-01-24: qty 100

## 2020-01-24 MED ORDER — ATORVASTATIN CALCIUM 80 MG PO TABS
80.0000 mg | ORAL_TABLET | Freq: Every day | ORAL | Status: DC
  Administered 2020-01-24: 17:00:00 80 mg via ORAL
  Filled 2020-01-24: qty 1

## 2020-01-24 MED ORDER — SODIUM CHLORIDE 0.9 % IV SOLN
INTRAVENOUS | Status: DC | PRN
  Administered 2020-01-24: 500 mL

## 2020-01-24 MED ORDER — MIDAZOLAM HCL 5 MG/5ML IJ SOLN
INTRAMUSCULAR | Status: DC | PRN
Start: 2020-01-24 — End: 2020-01-24
  Administered 2020-01-24: 2 mg via INTRAVENOUS

## 2020-01-24 MED ORDER — ONDANSETRON HCL 4 MG PO TABS: 4.0000 mg | ORAL_TABLET | Freq: Four times a day (QID) | ORAL | Status: DC | PRN

## 2020-01-24 MED ORDER — MENTHOL 3 MG MT LOZG
1.0000 | LOZENGE | OROMUCOSAL | Status: DC | PRN
  Filled 2020-01-24: qty 9

## 2020-01-24 MED ORDER — FINASTERIDE 5 MG PO TABS
5.0000 mg | ORAL_TABLET | Freq: Every evening | ORAL | Status: DC
  Administered 2020-01-24: 5 mg via ORAL
  Filled 2020-01-24: qty 1

## 2020-01-24 MED ORDER — ACETAMINOPHEN 325 MG PO TABS
650.0000 mg | ORAL_TABLET | ORAL | Status: DC | PRN
  Administered 2020-01-25: 08:00:00 650 mg via ORAL
  Filled 2020-01-24: qty 2

## 2020-01-24 MED ORDER — ONDANSETRON HCL 4 MG/2ML IJ SOLN
INTRAMUSCULAR | Status: AC
  Filled 2020-01-24: qty 2

## 2020-01-24 MED ORDER — ROCURONIUM BROMIDE 10 MG/ML (PF) SYRINGE
PREFILLED_SYRINGE | INTRAVENOUS | Status: AC
  Filled 2020-01-24: qty 20

## 2020-01-24 MED ORDER — OXYCODONE HCL 5 MG PO TABS
10.0000 mg | ORAL_TABLET | ORAL | Status: DC | PRN
  Administered 2020-01-24: 10 mg via ORAL

## 2020-01-24 MED ORDER — SODIUM CHLORIDE 0.9 % IV SOLN: INTRAVENOUS | Status: DC | PRN

## 2020-01-24 MED ORDER — NITROGLYCERIN 0.4 MG SL SUBL: 0.4000 mg | SUBLINGUAL_TABLET | SUBLINGUAL | Status: DC | PRN

## 2020-01-24 MED ORDER — DEXAMETHASONE SODIUM PHOSPHATE 10 MG/ML IJ SOLN
10.0000 mg | Freq: Once | INTRAMUSCULAR | Status: DC
  Filled 2020-01-24: qty 1

## 2020-01-24 MED ORDER — PANTOPRAZOLE SODIUM 40 MG PO TBEC
40.0000 mg | DELAYED_RELEASE_TABLET | Freq: Every day | ORAL | Status: DC
  Administered 2020-01-25: 40 mg via ORAL
  Filled 2020-01-24: qty 1

## 2020-01-24 MED ORDER — HEMOSTATIC AGENTS (NO CHARGE) OPTIME
TOPICAL | Status: DC | PRN
  Administered 2020-01-24: 1 via TOPICAL

## 2020-01-24 MED ORDER — LIDOCAINE 2% (20 MG/ML) 5 ML SYRINGE
INTRAMUSCULAR | Status: DC | PRN
  Administered 2020-01-24: 100 mg via INTRAVENOUS

## 2020-01-24 MED ORDER — SODIUM CHLORIDE 0.9 % IV SOLN: 250.0000 mL | INTRAVENOUS | Status: DC

## 2020-01-24 MED ORDER — CYCLOBENZAPRINE HCL 10 MG PO TABS
10.0000 mg | ORAL_TABLET | Freq: Three times a day (TID) | ORAL | Status: DC | PRN
  Administered 2020-01-24: 22:00:00 10 mg via ORAL
  Filled 2020-01-24: qty 1

## 2020-01-24 MED ORDER — ACETAMINOPHEN 325 MG PO TABS: 650.0000 mg | ORAL_TABLET | ORAL | Status: DC | PRN

## 2020-01-24 MED ORDER — OXYCODONE HCL 5 MG PO TABS
ORAL_TABLET | ORAL | Status: AC
  Filled 2020-01-24: qty 1

## 2020-01-24 MED ORDER — ONDANSETRON HCL 4 MG/2ML IJ SOLN: 4.0000 mg | Freq: Once | INTRAMUSCULAR | Status: DC | PRN

## 2020-01-24 MED ORDER — TAMSULOSIN HCL 0.4 MG PO CAPS
0.4000 mg | ORAL_CAPSULE | Freq: Every evening | ORAL | Status: DC
  Administered 2020-01-24: 0.4 mg via ORAL
  Filled 2020-01-24: qty 1

## 2020-01-24 MED ORDER — CEFAZOLIN SODIUM-DEXTROSE 2-4 GM/100ML-% IV SOLN
2.0000 g | Freq: Three times a day (TID) | INTRAVENOUS | Status: AC
  Administered 2020-01-24 (×2): 2 g via INTRAVENOUS
  Filled 2020-01-24 (×2): qty 100

## 2020-01-24 MED ORDER — CHLORHEXIDINE GLUCONATE CLOTH 2 % EX PADS: 6.0000 | MEDICATED_PAD | Freq: Once | CUTANEOUS | Status: DC

## 2020-01-24 MED ORDER — PROPOFOL 10 MG/ML IV BOLUS
INTRAVENOUS | Status: AC
  Filled 2020-01-24: qty 20

## 2020-01-24 MED ORDER — HYDROMORPHONE HCL 1 MG/ML IJ SOLN: 0.5000 mg | INTRAMUSCULAR | Status: DC | PRN

## 2020-01-24 MED ORDER — PHENOL 1.4 % MT LIQD: 1.0000 | OROMUCOSAL | Status: DC | PRN

## 2020-01-24 MED ORDER — 0.9 % SODIUM CHLORIDE (POUR BTL) OPTIME
TOPICAL | Status: DC | PRN
  Administered 2020-01-24: 1000 mL

## 2020-01-24 MED ORDER — ONDANSETRON HCL 4 MG/2ML IJ SOLN
INTRAMUSCULAR | Status: DC | PRN
  Administered 2020-01-24: 4 mg via INTRAVENOUS

## 2020-01-24 MED ORDER — EZETIMIBE 10 MG PO TABS
10.0000 mg | ORAL_TABLET | Freq: Every evening | ORAL | Status: DC
  Administered 2020-01-24: 18:00:00 10 mg via ORAL
  Filled 2020-01-24 (×2): qty 1

## 2020-01-24 MED ORDER — ROCURONIUM BROMIDE 10 MG/ML (PF) SYRINGE
PREFILLED_SYRINGE | INTRAVENOUS | Status: DC | PRN
  Administered 2020-01-24: 50 mg via INTRAVENOUS

## 2020-01-24 MED ORDER — PROPOFOL 10 MG/ML IV BOLUS
INTRAVENOUS | Status: DC | PRN
  Administered 2020-01-24: 180 mg via INTRAVENOUS

## 2020-01-24 MED ORDER — PHENYLEPHRINE HCL (PRESSORS) 10 MG/ML IV SOLN
INTRAVENOUS | Status: DC | PRN
  Administered 2020-01-24: 80 ug via INTRAVENOUS
  Administered 2020-01-24: 120 ug via INTRAVENOUS
  Administered 2020-01-24: 80 ug via INTRAVENOUS

## 2020-01-24 MED ORDER — ONDANSETRON HCL 4 MG/2ML IJ SOLN: 4.0000 mg | Freq: Four times a day (QID) | INTRAMUSCULAR | Status: DC | PRN

## 2020-01-24 MED ORDER — MIDAZOLAM HCL 2 MG/2ML IJ SOLN
INTRAMUSCULAR | Status: AC
  Filled 2020-01-24: qty 2

## 2020-01-24 MED ORDER — SODIUM CHLORIDE 0.9% FLUSH
3.0000 mL | Freq: Two times a day (BID) | INTRAVENOUS | Status: DC
  Administered 2020-01-24: 22:00:00 3 mL via INTRAVENOUS

## 2020-01-24 MED ORDER — LIDOCAINE-EPINEPHRINE 1 %-1:100000 IJ SOLN
INTRAMUSCULAR | Status: AC
  Filled 2020-01-24: qty 1

## 2020-01-24 MED ORDER — ALUM & MAG HYDROXIDE-SIMETH 200-200-20 MG/5ML PO SUSP: 30.0000 mL | Freq: Four times a day (QID) | ORAL | Status: DC | PRN

## 2020-01-24 MED ORDER — ACETAMINOPHEN 650 MG RE SUPP: 650.0000 mg | RECTAL | Status: DC | PRN

## 2020-01-24 MED ORDER — SUGAMMADEX SODIUM 200 MG/2ML IV SOLN
INTRAVENOUS | Status: DC | PRN
  Administered 2020-01-24: 200 mg via INTRAVENOUS

## 2020-01-24 MED ORDER — ARTIFICIAL TEARS OPHTHALMIC OINT
TOPICAL_OINTMENT | OPHTHALMIC | Status: DC | PRN
  Administered 2020-01-24: 1 via OPHTHALMIC

## 2020-01-24 MED ORDER — LIDOCAINE-EPINEPHRINE 1 %-1:100000 IJ SOLN
INTRAMUSCULAR | Status: DC | PRN
  Administered 2020-01-24: 10 mL

## 2020-01-24 MED ORDER — PHENYLEPHRINE HCL-NACL 10-0.9 MG/250ML-% IV SOLN
INTRAVENOUS | Status: DC | PRN
  Administered 2020-01-24: 30 ug/min via INTRAVENOUS

## 2020-01-24 MED ORDER — ROCURONIUM BROMIDE 10 MG/ML (PF) SYRINGE
PREFILLED_SYRINGE | INTRAVENOUS | Status: AC
  Filled 2020-01-24: qty 10

## 2020-01-24 MED ORDER — BUPROPION HCL ER (XL) 150 MG PO TB24
150.0000 mg | ORAL_TABLET | Freq: Every day | ORAL | Status: DC
  Administered 2020-01-25: 08:00:00 150 mg via ORAL
  Filled 2020-01-24: qty 1

## 2020-01-24 MED ORDER — DULOXETINE HCL 20 MG PO CPEP
20.0000 mg | ORAL_CAPSULE | Freq: Every day | ORAL | Status: DC
  Administered 2020-01-25: 08:00:00 20 mg via ORAL
  Filled 2020-01-24: qty 1

## 2020-01-24 MED ORDER — METOPROLOL TARTRATE 25 MG PO TABS
25.0000 mg | ORAL_TABLET | Freq: Two times a day (BID) | ORAL | Status: DC
  Administered 2020-01-24 – 2020-01-25 (×2): 25 mg via ORAL
  Filled 2020-01-24 (×2): qty 1

## 2020-01-24 SURGICAL SUPPLY — 58 items
BAG DECANTER FOR FLEXI CONT (MISCELLANEOUS) ×3 IMPLANT
BAND RUBBER #18 3X1/16 STRL (MISCELLANEOUS) ×2 IMPLANT
BENZOIN TINCTURE PRP APPL 2/3 (GAUZE/BANDAGES/DRESSINGS) ×3 IMPLANT
BLADE CLIPPER SURG (BLADE) ×2 IMPLANT
BLADE SURG 11 STRL SS (BLADE) ×3 IMPLANT
BUR CUTTER 7.0 ROUND (BURR) ×3 IMPLANT
BUR MATCHSTICK NEURO 3.0 LAGG (BURR) ×3 IMPLANT
CANISTER SUCT 3000ML PPV (MISCELLANEOUS) ×3 IMPLANT
CARTRIDGE OIL MAESTRO DRILL (MISCELLANEOUS) ×1 IMPLANT
CLOSURE WOUND 1/2 X4 (GAUZE/BANDAGES/DRESSINGS) ×1
COVER WAND RF STERILE (DRAPES) ×1 IMPLANT
DECANTER SPIKE VIAL GLASS SM (MISCELLANEOUS) ×3 IMPLANT
DERMABOND ADVANCED (GAUZE/BANDAGES/DRESSINGS) ×2
DERMABOND ADVANCED .7 DNX12 (GAUZE/BANDAGES/DRESSINGS) ×1 IMPLANT
DIFFUSER DRILL AIR PNEUMATIC (MISCELLANEOUS) ×3 IMPLANT
DRAPE HALF SHEET 40X57 (DRAPES) IMPLANT
DRAPE LAPAROTOMY 100X72X124 (DRAPES) ×3 IMPLANT
DRAPE MICROSCOPE LEICA (MISCELLANEOUS) ×1 IMPLANT
DRAPE SURG 17X23 STRL (DRAPES) ×3 IMPLANT
DRSG OPSITE POSTOP 4X6 (GAUZE/BANDAGES/DRESSINGS) ×2 IMPLANT
DURAPREP 26ML APPLICATOR (WOUND CARE) ×3 IMPLANT
ELECT REM PT RETURN 9FT ADLT (ELECTROSURGICAL) ×3
ELECTRODE REM PT RTRN 9FT ADLT (ELECTROSURGICAL) ×1 IMPLANT
EVACUATOR 1/8 PVC DRAIN (DRAIN) ×2 IMPLANT
GAUZE 4X4 16PLY RFD (DISPOSABLE) IMPLANT
GAUZE SPONGE 4X4 12PLY STRL (GAUZE/BANDAGES/DRESSINGS) ×1 IMPLANT
GLOVE BIO SURGEON STRL SZ7 (GLOVE) ×4 IMPLANT
GLOVE BIO SURGEON STRL SZ8 (GLOVE) ×3 IMPLANT
GLOVE BIOGEL PI IND STRL 7.0 (GLOVE) IMPLANT
GLOVE BIOGEL PI IND STRL 8 (GLOVE) IMPLANT
GLOVE BIOGEL PI INDICATOR 7.0 (GLOVE) ×4
GLOVE BIOGEL PI INDICATOR 8 (GLOVE) ×6
GLOVE ECLIPSE 7.5 STRL STRAW (GLOVE) ×6 IMPLANT
GLOVE EXAM NITRILE XL STR (GLOVE) IMPLANT
GLOVE INDICATOR 8.5 STRL (GLOVE) ×3 IMPLANT
GLOVE SURG SS PI 7.0 STRL IVOR (GLOVE) ×6 IMPLANT
GOWN STRL REUS W/ TWL LRG LVL3 (GOWN DISPOSABLE) ×1 IMPLANT
GOWN STRL REUS W/ TWL XL LVL3 (GOWN DISPOSABLE) ×2 IMPLANT
GOWN STRL REUS W/TWL 2XL LVL3 (GOWN DISPOSABLE) ×4 IMPLANT
GOWN STRL REUS W/TWL LRG LVL3 (GOWN DISPOSABLE) ×6
GOWN STRL REUS W/TWL XL LVL3 (GOWN DISPOSABLE) ×3
KIT BASIN OR (CUSTOM PROCEDURE TRAY) ×3 IMPLANT
KIT TURNOVER KIT B (KITS) ×3 IMPLANT
NDL SPNL 22GX3.5 QUINCKE BK (NEEDLE) ×1 IMPLANT
NEEDLE HYPO 22GX1.5 SAFETY (NEEDLE) ×3 IMPLANT
NEEDLE SPNL 22GX3.5 QUINCKE BK (NEEDLE) ×3 IMPLANT
NS IRRIG 1000ML POUR BTL (IV SOLUTION) ×3 IMPLANT
OIL CARTRIDGE MAESTRO DRILL (MISCELLANEOUS) ×3
PACK LAMINECTOMY NEURO (CUSTOM PROCEDURE TRAY) ×3 IMPLANT
SPONGE SURGIFOAM ABS GEL SZ50 (HEMOSTASIS) ×3 IMPLANT
STRIP CLOSURE SKIN 1/2X4 (GAUZE/BANDAGES/DRESSINGS) ×2 IMPLANT
SUT VIC AB 0 CT1 18XCR BRD8 (SUTURE) ×1 IMPLANT
SUT VIC AB 0 CT1 8-18 (SUTURE) ×3
SUT VIC AB 2-0 CT1 18 (SUTURE) ×3 IMPLANT
SUT VICRYL 4-0 PS2 18IN ABS (SUTURE) ×3 IMPLANT
TOWEL GREEN STERILE (TOWEL DISPOSABLE) ×3 IMPLANT
TOWEL GREEN STERILE FF (TOWEL DISPOSABLE) ×3 IMPLANT
WATER STERILE IRR 1000ML POUR (IV SOLUTION) ×3 IMPLANT

## 2020-01-24 NOTE — Op Note (Signed)
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  Work-up revealed severe spinal stenosis L3-4 L4-5 and due to patient's progression of clinical syndrome imaging findings and failed conservative treatment I recommended decompressive laminectomy at those 2 levels.  I extensively reviewed the risks and benefits of the operation with him as well as perioperative course expectations of outcome and alternatives of surgery and he understood and agreed to proceed forward.  Operative procedure: Patient brought into the OR was due to general anesthesia positioned prone the Wilson frame his back was prepped and draped in routine sterile fashion.  Anatomic landmarks localized the appropriate level so after infiltration of 10 cc lidocaine with epi midline incision was made and Bovie electrocautery was used to take down the subcutaneous tissue and subperiosteal dissection was carried lamina of L3-L4 and L5 bilaterally.  Intraoperative x-ray confirmed defecation appropriate level.  So at this point the spinous process at L3 and L4 was removed the superior aspect the spinous process was also removed at L5.  Central decompression was begun and a high-speed drill was used to thin out the lamina and with a 3 and 4 Miller Kerrison punch the central decompression was been initiated.  Ligament flow was noted markedly hypertrophied so working from both above and below the 3 4 disc base I removed some hypertrophied ligament  identified the dural thecal sac.  Then marching laterally under biting both gutters I removed an extensive mount of spur that was causing severe hourglass compression of thecal sac at both L3-4 and L4-5.  Performed foraminotomies of the L3-L4 and L5 nerve roots bilaterally.  After extensive mount of spurring and central decompression was begun there is no further stenosis of the thecal sac or the L3, L4 or L5 nerve roots.  Wound was then copiously irrigated to Kassim states was maintained Gelfoam was ON top of the dura and a medium Hemovac drain was placed.  The wound was closed in layers with Vicryl skin was closed running 4 subcuticular Dermabond benzoin Steri-Strips and a sterile dressing was applied patient recovery in stable condition.  At the end the case all needle counts and sponge counts were correct.

## 2020-01-24 NOTE — Anesthesia Postprocedure Evaluation (Signed)
Anesthesia Post Note  Patient: Randy Peterson  Procedure(s) Performed: Laminectomy and Foraminotomy - bilateral - Lumbar Three-Four, Lumbar Four-Five. (Bilateral Spine Lumbar)     Patient location during evaluation: PACU Anesthesia Type: General Level of consciousness: awake and alert Pain management: pain level controlled Vital Signs Assessment: post-procedure vital signs reviewed and stable Respiratory status: spontaneous breathing, nonlabored ventilation, respiratory function stable and patient connected to nasal cannula oxygen Cardiovascular status: blood pressure returned to baseline and stable Postop Assessment: no apparent nausea or vomiting Anesthetic complications: no    Last Vitals:  Vitals:   01/24/20 1430 01/24/20 1447  BP: 118/70 119/76  Pulse: 65 69  Resp: 18 16  Temp: 36.7 C 36.9 C  SpO2: 94% 97%    Last Pain:  Vitals:   01/24/20 1754  TempSrc:   PainSc: 6                  Dlisa Barnwell DAVID

## 2020-01-24 NOTE — Transfer of Care (Signed)
Immediate Anesthesia Transfer of Care Note  Patient: Randy Peterson  Procedure(s) Performed: Laminectomy and Foraminotomy - bilateral - Lumbar Three-Four, Lumbar Four-Five. (Bilateral Spine Lumbar)  Patient Location: PACU  Anesthesia Type:General  Level of Consciousness: drowsy and patient cooperative  Airway & Oxygen Therapy: Patient Spontanous Breathing and Patient connected to face mask oxygen  Post-op Assessment: Report given to RN, Post -op Vital signs reviewed and stable and Patient moving all extremities X 4  Post vital signs: Reviewed and stable  Last Vitals:  Vitals Value Taken Time  BP    Temp    Pulse 71 01/24/20 1352  Resp 18 01/24/20 1352  SpO2 100 % 01/24/20 1352  Vitals shown include unvalidated device data.  Last Pain:  Vitals:   01/24/20 0910  TempSrc: Tympanic         Complications: No apparent anesthesia complications

## 2020-01-24 NOTE — Anesthesia Procedure Notes (Signed)
Procedure Name: Intubation Date/Time: 01/24/2020 11:46 AM Performed by: Lowella Dell, CRNA Pre-anesthesia Checklist: Patient identified, Emergency Drugs available, Suction available and Patient being monitored Patient Re-evaluated:Patient Re-evaluated prior to induction Oxygen Delivery Method: Circle System Utilized Preoxygenation: Pre-oxygenation with 100% oxygen Induction Type: IV induction Ventilation: Mask ventilation without difficulty Laryngoscope Size: Mac and 4 Grade View: Grade I Tube type: Oral Tube size: 7.5 mm Number of attempts: 1 Airway Equipment and Method: Stylet Placement Confirmation: ETT inserted through vocal cords under direct vision,  positive ETCO2 and breath sounds checked- equal and bilateral Secured at: 21 cm Tube secured with: Tape Dental Injury: Teeth and Oropharynx as per pre-operative assessment

## 2020-01-24 NOTE — H&P (Signed)
Randy Peterson is an 74 y.o. male.   Chief Complaint: Back pain bilateral leg pain neurogenic claudication HPI: 73 year old gentleman with progressive worsening numbness tingling weakness in his legs fatigue neurogenic claudication.  Work-up revealed severe spinal stenosis L3-4 and L4-5.  Due to patient progression of clinical syndrome imaging findings failed conservative treatment I recommended decompressive laminectomy bilaterally at L3-4 and L4-5.  I have extensively gone over the risks and benefits of that procedure with him as well as perioperative course expectations of outcome and alternatives of surgery and he understands and agrees to proceed forward.  Past Medical History:  Diagnosis Date  . Acid reflux   . Acute MI, inferior wall, initial episode of care ([name]) 05/17/15  . Angina decubitus ([place]) 05/16/15  . Anxiety   . Arthritis    both shoulders and back  . CAD in native artery    per cath 05/16/14  . Depression   . Hyperlipidemia LDL goal <70   . Prostate enlargement   . S/P coronary artery stent placement, DES promus premier, RCA 05/17/15 05/18/2015    Past Surgical History:  Procedure Laterality Date  . CARDIAC CATHETERIZATION N/A 05/17/2015   Procedure: Left Heart Cath and Coronary Angiography;  Surgeon: Sherren Mocha, MD;  Location: [facility];  Service: Cardiovascular;  Laterality: N/A;  . CARDIAC CATHETERIZATION N/A 05/17/2015   Procedure: Coronary Stent Intervention;  Surgeon: Sherren Mocha, MD;  Location: [facility];  Service: Cardiovascular;  Laterality: N/A;  . None      Family History  Problem Relation Age of Onset  . Dementia Mother   . Stroke Mother   . Heart attack Father 1  . Suicidality Brother   . Depression Brother   . Heart attack Brother 67  . Hypertension Neg Hx    Social History:  reports that he has quit smoking. His smoking use included cigarettes. He has a 10.00 pack-year smoking history. He has never used smokeless tobacco. He  reports current alcohol use of about 2.0 standard drinks of alcohol per week. He reports that he does not use drugs.  Allergies: No Known Allergies  Medications Prior to Admission  Medication Sig Dispense Refill  . acetaminophen (TYLENOL) 325 MG tablet Take 2 tablets (650 mg total) by mouth every 4 (four) hours as needed for headache or mild pain.    Marland Kitchen aspirin EC 81 MG tablet Take 1 tablet (81 mg total) by mouth daily. 90 tablet 3  . atorvastatin (LIPITOR) 80 MG tablet Take 1 tablet (80 mg total) by mouth daily at 6 PM. 30 tablet 6  . buPROPion (WELLBUTRIN XL) 150 MG 24 hr tablet Take 1 tablet (150 mg total) by mouth daily. 90 tablet 0  . cholecalciferol (VITAMIN D) 1000 UNITS tablet Take 1,000 Units by mouth daily.    . DULoxetine (CYMBALTA) 20 MG capsule Take 1 capsule (20 mg total) by mouth daily. 90 capsule 0  . ezetimibe (ZETIA) 10 MG tablet Take 10 mg by mouth every evening.    . finasteride (PROSCAR) 5 MG tablet Take 5 mg by mouth every evening.     . metoprolol tartrate (LOPRESSOR) 25 MG tablet Take 1 tablet (25 mg total) by mouth 2 (two) times daily. 180 tablet 1  . omeprazole (PRILOSEC) 20 MG capsule Take 20 mg by mouth 2 (two) times daily.    . tamsulosin (FLOMAX) 0.4 MG CAPS capsule Take 0.4 mg by mouth every evening.     . neomycin-polymyxin-hydrocortisone (CORTISPORIN) OTIC solution  Apply 1-2 drops to the toe after soaking toe twice a day (Patient not taking: Reported on 01/07/2020) 10 mL 0  . nitroGLYCERIN (NITROSTAT) 0.4 MG SL tablet PLACE 1 TABLET UNDER THE TONGUE EVERY 5 MINUTES AS NEEDED FOR CHEST PAIN (Patient taking differently: Place 0.4 mg under the tongue every 5 (five) minutes as needed for chest pain. ) 25 tablet 3    Results for orders placed or performed during the hospital encounter of 01/22/20 (from the past 48 hour(s))  SARS CORONAVIRUS 2 (TAT 6-24 HRS) Nasopharyngeal Nasopharyngeal Swab     Status: None   Collection Time: 01/22/20 11:58 AM   Specimen:  Nasopharyngeal Swab  Result Value Ref Range   SARS Coronavirus 2 NEGATIVE NEGATIVE    Comment: (NOTE) SARS-CoV-2 target nucleic acids are NOT DETECTED. The SARS-CoV-2 RNA is generally detectable in upper and lower respiratory specimens during the acute phase of infection. Negative results do not preclude SARS-CoV-2 infection, do not rule out co-infections with other pathogens, and should not be used as the sole basis for treatment or other patient management decisions. Negative results must be combined with clinical observations, patient history, and epidemiological information. The expected result is Negative. Fact Sheet for Patients: SugarRoll.be Fact Sheet for Healthcare Providers: https://www.woods-mathews.com/ This test is not yet approved or cleared by the [place] FDA and  has been authorized for detection and/or diagnosis of SARS-CoV-2 by FDA under an Emergency Use Authorization (EUA). This EUA will remain  in effect (meaning this test can be used) for the duration of the COVID-19 declaration under Section 56 4(b)(1) of the Act, 21 U.S.C. section 360bbb-3(b)(1), unless the authorization is terminated or revoked sooner. Performed at [facility], [place] [address] [place], [place] [address]    No results found.  Review of Systems  Musculoskeletal: Positive for back pain.  Neurological: Positive for numbness.    Blood pressure 134/76, pulse 66, temperature (!) 97.3 F (36.3 C), temperature source Tympanic, height 5\' 6"  (1.676 m), weight 93 kg, SpO2 97 %. Physical Exam  Constitutional: He appears well-developed.  HENT:  Head: Normocephalic.  Eyes: Pupils are equal, round, and reactive to light.  Cardiovascular: Normal rate.  Respiratory: Effort normal.  GI: Soft.  Musculoskeletal:        General: Normal range of motion.     Cervical back: Normal range of motion.  Neurological: He is alert. He has normal strength.  GCS eye subscore is 4. GCS verbal subscore is 5. GCS motor subscore is 6.  Strength 5/5 iliopsoas, quads, hamstrings, gastrocs, and tibialis, and EHL.     Assessment/Plan 74 year old presents for decompressive laminectomy L3-4 L4-5  Caelynn Marshman P, MD 01/24/2020, 11:03 AM

## 2020-01-25 DIAGNOSIS — M48062 Spinal stenosis, lumbar region with neurogenic claudication: Secondary | ICD-10-CM | POA: Diagnosis not present

## 2020-01-25 NOTE — Evaluation (Signed)
Occupational Therapy Evaluation and Discharge Patient Details Name: Randy Peterson MRN: [id number] DOB: 1946-05-22 Today's Date: 01/25/2020    History of Present Illness Pt is a 74 yo male who was admitted for an elective posterior decopmerssive lumbar laminectomy L3-4, L4-5. PMH: CAD, anxiety, arthritis, depression.   Clinical Impression   This 74 y/o male presents with the above. PTA pt reports independence with ADL and functional mobility. Today pt completing functional transfers without AD, LB ADL tasks at supervision level. He was able to perform seated/standing UB ADL tasks with setup assist. Educated and further reviewed with both pt/pt's spouse re: back precautions, safety and compensatory techniques for completing ADL and functional transfers while maintaining precautions; pt requiring min cues for adherence to precautions but with improved carryover and overall with pt verbalizing/demonstrating good understanding throughout. Both pt/pt's spouse asking appropriate questions during session and with no further acute OT needs identified. Pt plans to return home with spouse assist PRN. Acute OT to sign off, thank you for this referral.     Follow Up Recommendations  No OT follow up;Supervision - Intermittent    Equipment Recommendations  None recommended by OT           Precautions / Restrictions Precautions Precautions: Back Precaution Booklet Issued: Yes (comment) Precaution Comments: pt educated, pt with good adherence, able to recall 3/3 start of session Required Braces or Orthoses: (no) Restrictions Weight Bearing Restrictions: No      Mobility Bed Mobility Overal bed mobility: Needs Assistance Bed Mobility: Sidelying to Sit;Rolling;Sit to Sidelying Rolling: Modified independent (Device/Increase time) Sidelying to sit: Supervision     Sit to sidelying: Supervision General bed mobility comments: seated EOB upon arrival  Transfers Overall transfer level: Needs  assistance Equipment used: None Transfers: Sit to/from Stand Sit to Stand: Supervision         General transfer comment: pt with good technique, minimal trunk flexion, pushed up from bed    Balance Overall balance assessment: No apparent balance deficits (not formally assessed)                                         ADL either performed or assessed with clinical judgement   ADL Overall ADL's : Needs assistance/impaired Eating/Feeding: Modified independent;Sitting   Grooming: Supervision/safety;Standing   Upper Body Bathing: Supervision/ safety;Sitting   Lower Body Bathing: Supervison/ safety;Sit to/from stand   Upper Body Dressing : Set up;Standing Upper Body Dressing Details (indicate cue type and reason): donning overhead shirt Lower Body Dressing: Supervision/safety;Sit to/from stand Lower Body Dressing Details (indicate cue type and reason): cues for compensatory technique Toilet Transfer: Supervision/safety;Ambulation   Toileting- Clothing Manipulation and Hygiene: Supervision/safety;Sit to/from Nurse, children's Details (indicate cue type and reason): educated to use shower seat for increased safety and adherence to back precautions with pt verbalizing understanding Functional mobility during ADLs: Supervision/safety       Vision         Perception     Praxis      Pertinent Vitals/Pain Pain Assessment: Faces Pain Score: 2  Faces Pain Scale: Hurts a little bit Pain Location: incision, reports R LE pain significantly diminished Pain Descriptors / Indicators: Operative site guarding Pain Intervention(s): Monitored during session     Hand Dominance Right   Extremity/Trunk Assessment Upper Extremity Assessment Upper Extremity Assessment: Overall WFL for tasks assessed   Lower Extremity Assessment Lower  Extremity Assessment: Defer to PT evaluation;Overall [facility] for tasks assessed   Cervical / Trunk Assessment Cervical /  Trunk Assessment: Other exceptions Cervical / Trunk Exceptions: back surgery   Communication Communication Communication: No difficulties   Cognition Arousal/Alertness: Awake/alert Behavior During Therapy: WFL for tasks assessed/performed Overall Cognitive Status: Within Functional Limits for tasks assessed                                     General Comments  VSS, incision covered by dressing    Exercises     Shoulder Instructions      Home Living Family/patient expects to be discharged to:: Private residence Living Arrangements: Spouse/significant other Available Help at Discharge: Family;Available 24 hours/day Type of Home: House Home Access: Stairs to enter [employer] of Steps: 5 Entrance Stairs-Rails: Can reach both;Right;Left Home Layout: One level     Bathroom Shower/Tub: Occupational psychologist: Handicapped height(one of each)     Home Equipment: Shower seat - built in;Hand held shower head          Prior Functioning/Environment Level of Independence: Independent                 OT Problem List: Decreased range of motion;Impaired balance (sitting and/or standing);Decreased knowledge of precautions;Decreased knowledge of use of DME or AE;Pain      OT Treatment/Interventions:      OT Goals(Current goals can be found in the care plan section) Acute Rehab OT Goals Patient Stated Goal: home today OT Goal Formulation: All assessment and education complete, DC therapy  OT Frequency:     Barriers to D/C:            Co-evaluation              AM-PAC OT "6 Clicks" Daily Activity     Outcome Measure Help from another person eating meals?: None Help from another person taking care of personal grooming?: A Little Help from another person toileting, which includes using toliet, bedpan, or urinal?: A Little Help from another person bathing (including washing, rinsing, drying)?: A Little Help from another person  to put on and taking off regular upper body clothing?: A Little Help from another person to put on and taking off regular lower body clothing?: A Little 6 Click Score: 19   End of Session Nurse Communication: Mobility status  Activity Tolerance: Patient tolerated treatment well Patient left: with call bell/phone within reach;Other (comment);with family/visitor present(seated EOB)  OT Visit Diagnosis: Other abnormalities of gait and mobility (R26.89);Pain Pain - part of body: (back)                Time: [id number] OT Time Calculation (min): 20 min Charges:  OT General Charges $OT Visit: 1 Visit OT Evaluation $OT Eval Low Complexity: [place], OT Acute Rehabilitation Services Pager [phone] Office [place] 01/25/2020, 10:33 AM

## 2020-01-25 NOTE — Progress Notes (Signed)
Discharged instructions/education/AVS given to patient with wife at bedside and they both verbalized understanding. Patient ambulating well with supervision. Emptying bladder well. Dressing dry and intact no drainage, no swelling, no redness noted on incision site. All questions from patient and wife have been answered appropriately and were satisfied. Discharged via wheelchair.

## 2020-01-25 NOTE — Discharge Summary (Signed)
Physician Discharge Summary  Patient ID: Randy Peterson MRN: [id number] DOB/AGE: [birth year] 74 y.o.  Admit date: 01/24/2020 Discharge date: 01/25/2020  Admission Diagnoses: Lumbar spinal stenosis L3-4 foot  Discharge Diagnoses: Same Active Problems:   Spinal stenosis at L4-L5 level   Discharged Condition: good  Hospital Course: Patient is admitted to hospital underwent decompressive laminectomy patient did very well to the recovery room and was floor was ambulating and voiding and tolerating regular diet stable for discharge  Consults:   Significant Diagnostic Studies:   Treatments: Posterior lumbar decompressive laminectomy L3-4 L4-5  Discharge Exam: Blood pressure 124/70, pulse 76, temperature 98.6 F (37 C), temperature source Oral, resp. rate 16, height 5\' 6"  (1.676 m), weight 93 kg, SpO2 99 %. Strength 5/5 wound clean dry and  Disposition: Discharge disposition: 01-Home or Self Care        Allergies as of 01/25/2020   No Known Allergies     Medication List    STOP taking these medications   neomycin-polymyxin-hydrocortisone OTIC solution Commonly known as: CORTISPORIN     TAKE these medications   acetaminophen 325 MG tablet Commonly known as: TYLENOL Take 2 tablets (650 mg total) by mouth every 4 (four) hours as needed for headache or mild pain.   aspirin EC 81 MG tablet Take 1 tablet (81 mg total) by mouth daily.   atorvastatin 80 MG tablet Commonly known as: LIPITOR Take 1 tablet (80 mg total) by mouth daily at 6 PM.   buPROPion 150 MG 24 hr tablet Commonly known as: Wellbutrin XL Take 1 tablet (150 mg total) by mouth daily.   cholecalciferol 1000 units tablet Commonly known as: VITAMIN D Take 1,000 Units by mouth daily.   DULoxetine 20 MG capsule Commonly known as: CYMBALTA Take 1 capsule (20 mg total) by mouth daily.   ezetimibe 10 MG tablet Commonly known as: ZETIA Take 10 mg by mouth every evening.   finasteride 5 MG tablet Commonly  known as: PROSCAR Take 5 mg by mouth every evening.   metoprolol tartrate 25 MG tablet Commonly known as: LOPRESSOR Take 1 tablet (25 mg total) by mouth 2 (two) times daily.   nitroGLYCERIN 0.4 MG SL tablet Commonly known as: NITROSTAT PLACE 1 TABLET UNDER THE TONGUE EVERY 5 MINUTES AS NEEDED FOR CHEST PAIN What changed: See the new instructions.   omeprazole 20 MG capsule Commonly known as: PRILOSEC Take 20 mg by mouth 2 (two) times daily.   tamsulosin 0.4 MG Caps capsule Commonly known as: FLOMAX Take 0.4 mg by mouth every evening.        Signed: Daniesha Driver P 01/25/2020, 7:58 AM

## 2020-01-25 NOTE — TOC Transition Note (Signed)
Transition of Care [facility]) - CM/SW Discharge Note   Patient Details  Name: Randy Peterson MRN: [id number] Date of Birth: 20-Apr-1946  [facility]) CM/SW Contact:  Carles Collet, RN Phone Number: 01/25/2020, 9:04 AM   Clinical Narrative:    No identifiable TOC needs for DC.           Patient Goals and CMS Choice        Discharge Placement                       Discharge Plan and Services                                     Social Determinants of Health (SDOH) Interventions     Readmission Risk Interventions No flowsheet data found.

## 2020-01-25 NOTE — Evaluation (Signed)
Physical Therapy Evaluation and DISCHARGE Patient Details Name: Randy Peterson MRN: [id number] DOB: [birth year] Today's Date: 01/25/2020   History of Present Illness  Pt is a 74 yo male who was admitted for an elective posterior decopmerssive lumbar laminectomy L3-4, L4-5. PMH: CAD, anxiety, arthritis, depression.    Clinical Impression  Pt admitted with above. Pt reports significant improvement in R LE strength and is now able to ascend stairs reciprocally. Pt with minimal incisional back pain, no R LE pain. Pt with good understanding of back precautions. Pt functioning at supervision level. Pt with good home set up and support. Pt with no further acute PT needs at this time. PT SIGNING OFF. Please re-consult if needed.    Follow Up Recommendations No PT follow up    Equipment Recommendations  None recommended by PT    Recommendations for Other Services       Precautions / Restrictions Precautions Precautions: Back Precaution Booklet Issued: Yes (comment) Precaution Comments: pt educated, pt with good adherence, able to recall 2/3 at end of session Required Braces or Orthoses: (no) Restrictions Weight Bearing Restrictions: No      Mobility  Bed Mobility Overal bed mobility: Needs Assistance Bed Mobility: Sidelying to Sit;Rolling;Sit to Sidelying Rolling: Modified independent (Device/Increase time) Sidelying to sit: Supervision     Sit to sidelying: Supervision General bed mobility comments: verbal cues for log roll technique and not to twist getting back into bed, no physical assist needed, HOB flat, no use of bed rail  Transfers Overall transfer level: Needs assistance Equipment used: None Transfers: Sit to/from Stand Sit to Stand: Supervision         General transfer comment: pt with good technique, minimal trunk flexion, pushed up from bed  Ambulation/Gait Ambulation/Gait assistance: Supervision Gait Distance (Feet): 200 Feet Assistive device: None Gait  Pattern/deviations: Step-through pattern;Decreased stride length Gait velocity: wfl Gait velocity interpretation: 1.31 - 2.62 ft/sec, indicative of limited community ambulator General Gait Details: WFL, able to stand up straight, denies pain, no antalgia  Stairs Stairs: Yes Stairs assistance: Min guard Stair Management: One rail Right;Alternating pattern;Step to pattern;Forwards Number of Stairs: 10 General stair comments: trialed both reciprocal and step to, pt did great with both  Wheelchair Mobility    Modified Rankin (Stroke Patients Only)       Balance Overall balance assessment: No apparent balance deficits (not formally assessed)                                           Pertinent Vitals/Pain Pain Assessment: 0-10 Pain Score: 2  Pain Location: incision, reports R LE pain significantly diminished Pain Descriptors / Indicators: Operative site guarding Pain Intervention(s): Monitored during session    Home Living Family/patient expects to be discharged to:: Private residence Living Arrangements: Spouse/significant other Available Help at Discharge: Family;Available 24 hours/day Type of Home: House Home Access: Stairs to enter Entrance Stairs-Rails: Can reach both;Right;Left Entrance Stairs-Number of Steps: 5 Home Layout: One level Home Equipment: Shower seat - built in;Hand held shower head      Prior Function Level of Independence: Independent               Hand Dominance   Dominant Hand: Right    Extremity/Trunk Assessment   Upper Extremity Assessment Upper Extremity Assessment: Overall WFL for tasks assessed    Lower Extremity Assessment Lower Extremity Assessment: Overall WFL for tasks assessed  Cervical / Trunk Assessment Cervical / Trunk Assessment: Other exceptions Cervical / Trunk Exceptions: back surgery  Communication   Communication: No difficulties  Cognition Arousal/Alertness: Awake/alert Behavior During  Therapy: WFL for tasks assessed/performed Overall Cognitive Status: Within Functional Limits for tasks assessed                                        General Comments General comments (skin integrity, edema, etc.): VSS, incision covered by dressing    Exercises     Assessment/Plan    PT Assessment Patent does not need any further PT services  PT Problem List         PT Treatment Interventions      PT Goals (Current goals can be found in the Care Plan section)  Acute Rehab PT Goals Patient Stated Goal: home today PT Goal Formulation: All assessment and education complete, DC therapy    Frequency     Barriers to discharge        Co-evaluation               AM-PAC PT "6 Clicks" Mobility  Outcome Measure Help needed turning from your back to your side while in a flat bed without using bedrails?: None Help needed moving from lying on your back to sitting on the side of a flat bed without using bedrails?: None Help needed moving to and from a bed to a chair (including a wheelchair)?: None Help needed standing up from a chair using your arms (e.g., wheelchair or bedside chair)?: None Help needed to walk in hospital room?: None Help needed climbing 3-5 steps with a railing? : A Little 6 Click Score: 23    End of Session   Activity Tolerance: Patient tolerated treatment well Patient left: in bed;with call bell/phone within reach(sitting EOB) Nurse Communication: Mobility status PT Visit Diagnosis: Muscle weakness (generalized) (M62.81)    Time: [id number] PT Time Calculation (min) (ACUTE ONLY): 21 min   Charges:   PT Evaluation $PT Eval Low Complexity: 1 Low          Kittie Plater, PT, DPT Acute Rehabilitation Services Pager #: [phone] Office #: [phone]   Randy Peterson 01/25/2020, 8:28 AM

## 2020-04-20 ENCOUNTER — Other Ambulatory Visit: Payer: Self-pay

## 2020-04-20 ENCOUNTER — Encounter (HOSPITAL_COMMUNITY): Payer: Self-pay | Admitting: Psychiatry

## 2020-04-20 ENCOUNTER — Other Ambulatory Visit (HOSPITAL_COMMUNITY): Payer: Self-pay | Admitting: *Deleted

## 2020-04-20 ENCOUNTER — Telehealth (INDEPENDENT_AMBULATORY_CARE_PROVIDER_SITE_OTHER): Payer: Medicare Other | Admitting: Psychiatry

## 2020-04-20 VITALS — Wt 200.0 lb

## 2020-04-20 DIAGNOSIS — F330 Major depressive disorder, recurrent, mild: Secondary | ICD-10-CM

## 2020-04-20 DIAGNOSIS — F419 Anxiety disorder, unspecified: Secondary | ICD-10-CM

## 2020-04-20 MED ORDER — ALPRAZOLAM 0.5 MG PO TABS: ORAL_TABLET | ORAL | 0 refills | Status: DC

## 2020-04-20 MED ORDER — BUPROPION HCL ER (XL) 150 MG PO TB24: 150.0000 mg | ORAL_TABLET | Freq: Every day | ORAL | 0 refills | Status: DC

## 2020-04-20 MED ORDER — DULOXETINE HCL 20 MG PO CPEP: 20.0000 mg | ORAL_CAPSULE | Freq: Every day | ORAL | 0 refills | Status: DC

## 2020-04-20 NOTE — Progress Notes (Signed)
Virtual Visit via Telephone Note  I connected with Randy Peterson on 04/20/20 at  1:20 PM EDT by telephone and verified that I am speaking with the correct person using two identifiers.  Location: Patient: home Provider: home office   I discussed the limitations, risks, security and privacy concerns of performing an evaluation and management service by telephone and the availability of in person appointments. I also discussed with the patient that there may be a patient responsible charge related to this service. The patient expressed understanding and agreed to proceed.   History of Present Illness: Patient is evaluated by phone session.  He is taking Cymbalta and Wellbutrin but occasionally takes Xanax.  Last prescription of Xanax was filled more than a year ago and still has some pills left but now he is completely.  Patient has laminectomy 3 months ago but he did not believe it helped.  He still have weakness on his legs when he tries to go up stairs or walk.  However he is able to drive.  He may need a hip replacement.  His daughter is getting married in first week of October and he is excited.  He wanted to get better before the surgery.  He is sleeping okay.  Occasionally takes Xanax but otherwise he feels low-dose Wellbutrin and Cymbalta are working well.  He is trying to lose weight.  Denies drinking or using any illegal substances.  His energy level is okay.   Past Psychiatric History:Reviewed. H/Oinpatient in 2062forsuicidal attemptandcut wrist.Depressedbecause lostjob.TookPaxil, Risperdal and given Vyvanseforattention and focus.Cymbaltaand Wellbutrinhelped.   Psychiatric Specialty Exam: Physical Exam  Review of Systems  Weight 200 lb (90.7 kg).There is no height or weight on file to calculate BMI.  General Appearance: NA  Eye Contact:  NA  Speech:  Normal Rate  Volume:  Normal  Mood:  Anxious  Affect:  NA  Thought Process:  Goal Directed  Orientation:   Full (Time, Place, and Person)  Thought Content:  Logical  Suicidal Thoughts:  No  Homicidal Thoughts:  No  Memory:  Immediate;   Good Recent;   Good Remote;   Good  Judgement:  Good  Insight:  Good  Psychomotor Activity:  NA  Concentration:  Concentration: Fair and Attention Span: Fair  Recall:  Good  Fund of Knowledge:  Good  Language:  Good  Akathisia:  No  Handed:  Right  AIMS (if indicated):     Assets:  Communication Skills Desire for Improvement Housing Resilience Social Support Transportation  ADL's:  Intact  Cognition:  WNL  Sleep:   good      Assessment and Plan: Major depressive disorder, recurrent.  Anxiety.  Patient is doing okay and he rarely takes Xanax 0.5 mg.  Last prescription was given more than a year ago.  We will provide another prescription to take as needed for severe anxiety.  Continue Wellbutrin XL 150 mg daily and Cymbalta 20 mg daily.  Recommended to call us back if is any question or any concern.  Follow-up in 3 months.  Follow Up Instructions:    I discussed the assessment and treatment plan with the patient. The patient was provided an opportunity to ask questions and all were answered. The patient agreed with the plan and demonstrated an understanding of the instructions.   The patient was advised to call back or seek an in-person evaluation if the symptoms worsen or if the condition fails to improve as anticipated.  I provided 15 minutes of non-face-to-face time during  this encounter.   Kathlee Nations, MD

## 2020-04-21 ENCOUNTER — Telehealth (HOSPITAL_COMMUNITY): Payer: Medicare Other | Admitting: Psychiatry

## 2020-06-06 ENCOUNTER — Ambulatory Visit: Payer: Self-pay

## 2020-06-06 ENCOUNTER — Ambulatory Visit: Payer: Medicare Other | Admitting: Orthopaedic Surgery

## 2020-06-06 ENCOUNTER — Encounter: Payer: Self-pay | Admitting: Orthopaedic Surgery

## 2020-06-06 VITALS — Ht 66.0 in | Wt 203.0 lb

## 2020-06-06 DIAGNOSIS — M25559 Pain in unspecified hip: Secondary | ICD-10-CM | POA: Diagnosis not present

## 2020-06-06 DIAGNOSIS — M1611 Unilateral primary osteoarthritis, right hip: Secondary | ICD-10-CM | POA: Diagnosis not present

## 2020-06-06 NOTE — Progress Notes (Signed)
Office Visit Note   Patient: Randy Peterson           Date of Birth: Jan 09, 1946           MRN: [id number] Visit Date: 06/06/2020              Requested by: Derinda Late, MD [address] [place],  [place] [address] PCP: Derinda Late, MD   Assessment & Plan: Visit Diagnoses:  1. Hip pain   2. Unilateral primary osteoarthritis, right hip     Plan: At this point I have recommended hip replacement surgery when he gets to the point where it is definitely detrimentally affecting his activities day living, his quality of life and his mobility.  He would like to consider continuing outpatient physical therapy and a home exercise program which I agree with.  I gave him a handout about hip replacement surgery and talked in detail about what the surgery involves including a discussion of the risk and benefits of surgery.  We talked about his interoperative and postoperative course.  I showed him a hip model as well and gave him a printout of his hip x-rays.  All questions and concerns were answered and addressed.  He has our surgery scheduler's card and is welcome to call us when he would like to consider having this scheduled.  Follow-Up Instructions: Return if symptoms worsen or fail to improve.   Orders:  Orders Placed This Encounter  Procedures  . XR HIP UNILAT W OR W/O PELVIS 1V RIGHT   No orders of the defined types were placed in this encounter.     Procedures: No procedures performed   Clinical Data: No additional findings.   Subjective: Chief Complaint  Patient presents with  . Right Hip - Pain  Patient is a 74 year old gentleman sent from Dr. Ledon Snare to evaluate and treat severe osteoarthritis of the right hip.  He is also a patient of Dr. Kary Kos who is done spine surgery on him.  A MRI was then obtained of the right hip after significant right hip and groin pain and stiffness and the MRI showed severe end-stage arthritis of that hip and now has been  sent to me for evaluation treatment of this.  He has been going to [facility] physical therapy for physical therapy of the hip and that is helped.  He said the pain is not as bad in the buttocks as it was before he says some groin pain and stiffness.  Is harder to cross his leg and to put his shoes and socks on.  At this point his hip is detriment affecting his mobility his quality of life and his activities day living but he is not sure if it is painful enough to proceed with hip replacement surgery as of yet.  He is not a diabetic and has no acute active medical problems.  He is not on any blood thinning medication.  HPI  Review of Systems The patient currently denies any headache, chest pain, shortness of breath, fever, chills, nausea, vomiting  Objective: Vital Signs: Ht 5\' 6"  (1.676 m)   Wt 203 lb (92.1 kg)   BMI 32.77 kg/m   Physical Exam He is alert and orient x3 and in no acute distress Ortho Exam Examination of his left hip is normal examination of his right hip show significant stiffness with internal and external rotation with some pain in the groin as well. Specialty Comments:  No specialty comments available.  Imaging: XR  HIP UNILAT W OR W/O PELVIS 1V RIGHT  Result Date: 06/06/2020 An AP pelvis and lateral of the right hip show severe end-stage arthritis of the right hip.  There is complete loss of joint space.  There is periarticular osteophytes and cystic changes in both the femoral head and acetabulum.    PMFS History: Patient Active Problem List   Diagnosis Date Noted  . Unilateral primary osteoarthritis, right hip 06/06/2020  . Spinal stenosis at L4-L5 level 01/24/2020  . Educated about COVID-19 virus infection 09/30/2019  . Coronary artery disease involving native coronary artery of native heart without angina pectoris 10/15/2018  . Dyslipidemia 01/28/2017  . Diverticulosis 10/29/2016  . History of adenomatous polyp of colon 10/29/2016  . Internal hemorrhoids  10/29/2016  . Primary osteoarthritis, right shoulder 09/13/2016  . CAD S/P PCI 05/18/2015  . History of NSTEMI 05/16/2015  . Generalized anxiety disorder 02/04/2012  . Major depressive disorder, recurrent episode, mild ([place]) 02/04/2012  . Major depressive disorder, recurrent episode, severe, without mention of psychotic behavior 12/12/2011  . Chest pain with moderate risk for cardiac etiology 08/07/2009   Past Medical History:  Diagnosis Date  . Acid reflux   . Acute MI, inferior wall, initial episode of care ([place]) 05/17/15  . Angina decubitus ([place]) 05/16/15  . Anxiety   . Arthritis    both shoulders and back  . CAD in native artery    per cath 05/16/14  . Depression   . Hyperlipidemia LDL goal <70   . Prostate enlargement   . S/P coronary artery stent placement, DES promus premier, RCA 05/17/15 05/18/2015    Family History  Problem Relation Age of Onset  . Dementia Mother   . Stroke Mother   . Heart attack Father 57  . Suicidality Brother   . Depression Brother   . Heart attack Brother 75  . Hypertension Neg Hx     Past Surgical History:  Procedure Laterality Date  . CARDIAC CATHETERIZATION N/A 05/17/2015   Procedure: Left Heart Cath and Coronary Angiography;  Surgeon: Sherren Mocha, MD;  Location: [facility];  Service: Cardiovascular;  Laterality: N/A;  . CARDIAC CATHETERIZATION N/A 05/17/2015   Procedure: Coronary Stent Intervention;  Surgeon: Sherren Mocha, MD;  Location: [facility];  Service: Cardiovascular;  Laterality: N/A;  . LUMBAR LAMINECTOMY/DECOMPRESSION MICRODISCECTOMY Bilateral 01/24/2020   Procedure: Laminectomy and Foraminotomy - bilateral - Lumbar Three-Four, Lumbar Four-Five.;  Surgeon: Kary Kos, MD;  Location: [facility];  Service: Neurosurgery;  Laterality: Bilateral;  posterior  . None     Social History   Occupational History  . Occupation: RETIRED    Comment: [employer]  Tobacco Use  . Smoking status: Former Smoker    Packs/day:  0.25    Years: 40.00    Pack years: 10.00    Types: Cigarettes  . Smokeless tobacco: Never Used  . Tobacco comment: Quit 20 years ago  Vaping Use  . Vaping Use: Never used  Substance and Sexual Activity  . Alcohol use: Yes    Alcohol/week: 2.0 standard drinks    Types: 2 Cans of beer per week  . Drug use: No  . Sexual activity: Yes    Partners: Female    Birth control/protection: None

## 2020-07-18 ENCOUNTER — Other Ambulatory Visit: Payer: Self-pay

## 2020-07-18 ENCOUNTER — Encounter (HOSPITAL_COMMUNITY): Payer: Self-pay | Admitting: Psychiatry

## 2020-07-18 ENCOUNTER — Telehealth (INDEPENDENT_AMBULATORY_CARE_PROVIDER_SITE_OTHER): Payer: Medicare Other | Admitting: Psychiatry

## 2020-07-18 DIAGNOSIS — F419 Anxiety disorder, unspecified: Secondary | ICD-10-CM | POA: Diagnosis not present

## 2020-07-18 DIAGNOSIS — F330 Major depressive disorder, recurrent, mild: Secondary | ICD-10-CM

## 2020-07-18 MED ORDER — DULOXETINE HCL 20 MG PO CPEP: 20.0000 mg | ORAL_CAPSULE | Freq: Every day | ORAL | 0 refills | Status: DC

## 2020-07-18 MED ORDER — BUPROPION HCL ER (XL) 150 MG PO TB24: 150.0000 mg | ORAL_TABLET | Freq: Every day | ORAL | 0 refills | Status: DC

## 2020-07-18 NOTE — Progress Notes (Signed)
Virtual Visit via Telephone Note  I connected with Randy Peterson on 07/18/20 at  1:20 PM EDT by telephone and verified that I am speaking with the correct person using two identifiers.  Location: Patient: Home Provider: Home Office   I discussed the limitations, risks, security and privacy concerns of performing an evaluation and management service by telephone and the availability of in person appointments. I also discussed with the patient that there may be a patient responsible charge related to this service. The patient expressed understanding and agreed to proceed.   History of Present Illness: Patient is evaluated by phone session.  He is taking Cymbalta and Wellbutrin and he feels things are going well.  He has not taken Xanax in a year.  He still have some leg weakness which he was hoping resolve after laminectomy 6 months ago.  Now he was told he may need hip replacement and he is thinking about to have a procedure in January.  Overall he is sleeping good.  He has no panic attack, crying spells or any feeling of hopelessness or worthlessness.  He is pleased that his daughter got married in October and everything go very well.  His energy level is okay.  His appetite is okay.  He denies drinking or using any illegal substances.  He lives with his wife and 2 dogs.  He has no tremors, shakes or any EPS.  He like to keep his current medication.   Past Psychiatric History: H/Oinpatient in 2076forsuicidal attemptandcut wrist.Depressedbecause lostjob.TookPaxil, Risperdal and given Vyvanseforattention and focus.Cymbaltaand Wellbutrinhelped.   Psychiatric Specialty Exam: Physical Exam  Review of Systems  Weight 203 lb (92.1 kg).There is no height or weight on file to calculate BMI.  General Appearance: NA  Eye Contact:  NA  Speech:  Clear and Coherent and Normal Rate  Volume:  Normal  Mood:  Euthymic  Affect:  NA  Thought Process:  Goal Directed  Orientation:  Full  (Time, Place, and Person)  Thought Content:  Logical  Suicidal Thoughts:  No  Homicidal Thoughts:  No  Memory:  Immediate;   Good Recent;   Good Remote;   Good  Judgement:  Good  Insight:  Present  Psychomotor Activity:  NA  Concentration:  Concentration: Good and Attention Span: Good  Recall:  Good  Fund of Knowledge:  Good  Language:  Good  Akathisia:  No  Handed:  Right  AIMS (if indicated):     Assets:  Communication Skills Desire for Improvement Housing Resilience Social Support  ADL's:  Intact  Cognition:  WNL  Sleep:   good      Assessment and Plan: Major depressive disorder, recurrent.  Anxiety.  Patient is a stable on his current medication.  Continue Wellbutrin XL 150 mg daily and Cymbalta 20 mg daily.  Discussed medication side effects and benefits.  I recommend to call us back if there is any question.  I also reminded if he need a refill of Xanax and he can call since the last prescription was given more than a year ago and he is not taking the Xanax.  Follow-up in 3 months.  Follow Up Instructions:    I discussed the assessment and treatment plan with the patient. The patient was provided an opportunity to ask questions and all were answered. The patient agreed with the plan and demonstrated an understanding of the instructions.   The patient was advised to call back or seek an in-person evaluation if the symptoms worsen or if  the condition fails to improve as anticipated.  I provided 15 minutes of non-face-to-face time during this encounter.   Kathlee Nations, MD

## 2020-07-27 ENCOUNTER — Ambulatory Visit: Payer: Medicare Other | Attending: Internal Medicine

## 2020-07-27 DIAGNOSIS — Z23 Encounter for immunization: Secondary | ICD-10-CM

## 2020-07-27 NOTE — Progress Notes (Signed)
   Covid-19 Vaccination Clinic  Name:  Randy Peterson    MRN: [id number] DOB: 09/15/1946  07/27/2020  Mr. Randy Peterson was observed post Covid-19 immunization for 15 minutes without incident. He was provided with Vaccine Information Sheet and instruction to access the V-Safe system.   Mr. Randy Peterson was instructed to call 911 with any severe reactions post vaccine: Marland Kitchen Difficulty breathing  . Swelling of face and throat  . A fast heartbeat  . A bad rash all over body  . Dizziness and weakness

## 2020-08-09 ENCOUNTER — Ambulatory Visit: Payer: Medicare Other | Admitting: Orthopaedic Surgery

## 2020-08-09 VITALS — Ht 66.0 in | Wt 203.0 lb

## 2020-08-09 DIAGNOSIS — M1611 Unilateral primary osteoarthritis, right hip: Secondary | ICD-10-CM | POA: Diagnosis not present

## 2020-08-09 NOTE — Progress Notes (Signed)
The patient is a very pleasant 74 year old gentleman that I am seeing for the second time as it relates to severe end-stage arthritis involving his right hip.  I saw him in September of this year and talked in detail about hip replacement surgery.  He has come in today again to discuss having his right hip replaced.  He does have his wife with him.  I did show him his x-rays and show her the x-ray showing his end-stage arthritis.  There is complete loss of the joint space of the right hip.  There are sclerotic changes and cystic changes as well as particular osteophytes.  His left hip appears normal.  He is having pain on a daily basis with his right hip.  It hurts going up and down stairs and at this point it is detriment affecting his mobility, his quality of life and his activities day living.  He has had no acute changes in medical status.  He currently denies any headache, chest pain, shortness of breath, fever, chills, nausea, vomiting.  He is alert and orient x3.  Examination of his right hip shows stiffness with internal and external rotation as well as pain in the groin.  I did show him again a hip model and using his x-rays explained in detail with hip replacement surgery involves.  I discussed the risk and benefits of the surgery and what to expect from an intraoperative and postoperative course.  All questions and concerns were answered and addressed.  We will work on getting his right hip replacement scheduled hopefully in the near future.  Also see the previous note from September showing his full exam as well as notes as it pertains to past and present medical history and clinical exam.

## 2020-10-04 ENCOUNTER — Other Ambulatory Visit: Payer: Self-pay | Admitting: Physician Assistant

## 2020-10-04 ENCOUNTER — Other Ambulatory Visit: Payer: Self-pay

## 2020-10-04 NOTE — Progress Notes (Signed)
Please enter orders for PAT visit 10-05-20

## 2020-10-04 NOTE — Progress Notes (Addendum)
COVID Vaccine Completed: x3 Date COVID Vaccine completed:  10-22-19, 11-16-19 and 07-27-20 COVID vaccine manufacturer: Pf[employer]      PCP - Derinda Late, MD Cardiologist - Minus Breeding, MD  Chest x-ray -  EKG - 10-04-19 in Epic Stress Test - 07-15-18 in Epic ECHO - 05-17-15 in epic Cardiac Cath - 05-17-15 in epic Pacemaker/ICD device last checked:  Sleep Study -  CPAP -   Fasting Blood Sugar -  Checks Blood Sugar _____ times a day  Blood Thinner Instructions: Aspirin Instructions: Last Dose:  Anesthesia review: CAD, hx of MI, stent placement  Patient denies shortness of breath, fever, cough and chest pain at PAT appointment   Patient verbalized understanding of instructions that were given to them at the PAT appointment. Patient was also instructed that they will need to review over the PAT instructions again at home before surgery.

## 2020-10-04 NOTE — Patient Instructions (Addendum)
DUE TO COVID-19 ONLY ONE VISITOR IS ALLOWED TO COME WITH YOU AND STAY IN THE WAITING ROOM ONLY DURING PRE OP AND PROCEDURE.   IF YOU WILL BE ADMITTED INTO THE HOSPITAL YOU ARE ALLOWED ONE SUPPORT PERSON DURING VISITATION HOURS ONLY (10AM -8PM)   . The support person may change daily. . The support person must pass our screening, gel in and out, and wear a mask at all times, including in the patient's room. . Patients must also wear a mask when staff or their support person are in the room.   COVID SWAB TESTING MUST BE COMPLETED ON:   Wednesday, 10-11-20 @ 2:30 pm    [address] [place], [place] [address]  (Must self quarantine after testing. Follow instructions on handout.)        Your procedure is scheduled on:   Friday, 10-13-20   Report to [facility] Main Main  Entrance   Report to admitting at 6:15 AM   Call this number if you have problems the morning of surgery [phone]   Do not eat food :After Midnight.   May have clear liquids until 5:45 AM day of surgery  CLEAR LIQUID DIET  Foods Allowed                                                                     Foods Excluded  Water, Black Coffee and tea, regular and decaf            liquids that you cannot  Plain Jell-O in any flavor  (No red)                                   see through such as: Fruit ices (not with fruit pulp)                                      milk, soups, orange juice              Iced Popsicles (No red)                                      All solid food                                   Apple juices Sports drinks like Gatorade (No red) Lightly seasoned clear broth or consume(fat free) Sugar, honey syrup   Complete one Ensure drink the morning of surgery at 0545 the day of surgery.        1. The day of surgery:  ? Drink ONE (1) Pre-Surgery Clear Ensure or G2 by am the morning of surgery. Drink in one sitting. Do not sip.  ? This drink was given to you during your hospital  pre-op  appointment visit. ? Nothing else to drink after completing the  Pre-Surgery Clear Ensure or G2.          If you have questions, please contact  your surgeon's office.     Oral Hygiene is also important to reduce your risk of infection.                                    Remember - BRUSH YOUR TEETH THE MORNING OF SURGERY WITH YOUR REGULAR TOOTHPASTE   Do NOT smoke after Midnight   Take these medicines the morning of surgery:  Bupropion, Duloxetine, Nexium, Metoprolol, May use Xidra eye drops                                You may not have any metal on your body including  jewelry, and body piercings             Do not wear  lotions, powders, perfumes/cologne, or deodorant             Men may shave face and neck.   Do not bring valuables to the hospital. [place].   Contacts, dentures or bridgework may not be worn into surgery.   Bring small overnight bag day of surgery.    Special Instructions: Bring a copy of your healthcare power of attorney and living will documents         the day of surgery if you haven't scanned them in before.              Please read over the following fact sheets you were given: IF YOU HAVE QUESTIONS ABOUT YOUR PRE OP INSTRUCTIONS PLEASE CALL [phone]   [place] - Preparing for Surgery Before surgery, you can play an important role.  Because skin is not sterile, your skin needs to be as free of germs as possible.  You can reduce the number of germs on your skin by washing with CHG (chlorahexidine gluconate) soap before surgery.  CHG is an antiseptic cleaner which kills germs and bonds with the skin to continue killing germs even after washing. Please DO NOT use if you have an allergy to CHG or antibacterial soaps.  If your skin becomes reddened/irritated stop using the CHG and inform your nurse when you arrive at Short Stay. Do not shave (including legs and underarms) for at least 48 hours prior to the first CHG  shower.  You may shave your face/neck.  Please follow these instructions carefully:  1.  Shower with CHG Soap the night before surgery and the  morning of surgery.  2.  If you choose to wash your hair, wash your hair first as usual with your normal  shampoo.  3.  After you shampoo, rinse your hair and body thoroughly to remove the shampoo.                             4.  Use CHG as you would any other liquid soap.  You can apply chg directly to the skin and wash.  Gently with a scrungie or clean washcloth.  5.  Apply the CHG Soap to your body ONLY FROM THE NECK DOWN.   Do   not use on face/ open                           Wound or open sores. Avoid contact with eyes, ears mouth  and   genitals (private parts).                       Wash face,  Genitals (private parts) with your normal soap.             6.  Wash thoroughly, paying special attention to the area where your    surgery  will be performed.  7.  Thoroughly rinse your body with warm water from the neck down.  8.  DO NOT shower/wash with your normal soap after using and rinsing off the CHG Soap.                9.  Pat yourself dry with a clean towel.            10.  Wear clean pajamas.            11.  Place clean sheets on your bed the night of your first shower and do not  sleep with pets. Day of Surgery : Do not apply any lotions/deodorants the morning of surgery.  Please wear clean clothes to the hospital/surgery center.  FAILURE TO FOLLOW THESE INSTRUCTIONS MAY RESULT IN THE CANCELLATION OF YOUR SURGERY  PATIENT SIGNATURE_________________________________  NURSE SIGNATURE__________________________________  ________________________________________________________________________  WHAT IS A BLOOD TRANSFUSION? Blood Transfusion Information  A transfusion is the replacement of blood or some of its parts. Blood is made up of multiple cells which provide different functions.  Red blood cells carry oxygen and are used for blood loss  replacement.  White blood cells fight against infection.  Platelets control bleeding.  Plasma helps clot blood.  Other blood products are available for specialized needs, such as hemophilia or other clotting disorders. BEFORE THE TRANSFUSION  Who gives blood for transfusions?   Healthy volunteers who are fully evaluated to make sure their blood is safe. This is blood bank blood. Transfusion therapy is the safest it has ever been in the practice of medicine. Before blood is taken from a donor, a complete history is taken to make sure that person has no history of diseases nor engages in risky social behavior (examples are intravenous drug use or sexual activity with multiple partners). The donor's travel history is screened to minimize risk of transmitting infections, such as malaria. The donated blood is tested for signs of infectious diseases, such as HIV and hepatitis. The blood is then tested to be sure it is compatible with you in order to minimize the chance of a transfusion reaction. If you or a relative donates blood, this is often done in anticipation of surgery and is not appropriate for emergency situations. It takes many days to process the donated blood. RISKS AND COMPLICATIONS Although transfusion therapy is very safe and saves many lives, the main dangers of transfusion include:   Getting an infectious disease.  Developing a transfusion reaction. This is an allergic reaction to something in the blood you were given. Every precaution is taken to prevent this. The decision to have a blood transfusion has been considered carefully by your caregiver before blood is given. Blood is not given unless the benefits outweigh the risks. AFTER THE TRANSFUSION  Right after receiving a blood transfusion, you will usually feel much better and more energetic. This is especially true if your red blood cells have gotten low (anemic). The transfusion raises the level of the red blood cells which  carry oxygen, and this usually causes an energy increase.  The nurse administering the transfusion  will monitor you carefully for complications. HOME CARE INSTRUCTIONS  No special instructions are needed after a transfusion. You may find your energy is better. Speak with your caregiver about any limitations on activity for underlying diseases you may have. SEEK MEDICAL CARE IF:   Your condition is not improving after your transfusion.  You develop redness or irritation at the intravenous (IV) site. SEEK IMMEDIATE MEDICAL CARE IF:  Any of the following symptoms occur over the next 12 hours:  Shaking chills.  You have a temperature by mouth above 102 F (38.9 C), not controlled by medicine.  Chest, back, or muscle pain.  People around you feel you are not acting correctly or are confused.  Shortness of breath or difficulty breathing.  Dizziness and fainting.  You get a rash or develop hives.  You have a decrease in urine output.  Your urine turns a dark color or changes to pink, red, or brown. Any of the following symptoms occur over the next 10 days:  You have a temperature by mouth above 102 F (38.9 C), not controlled by medicine.  Shortness of breath.  Weakness after normal activity.  The white part of the eye turns yellow (jaundice).  You have a decrease in the amount of urine or are urinating less often.  Your urine turns a dark color or changes to pink, red, or brown. Document Released: 08/30/2000 Document Revised: 11/25/2011 Document Reviewed: 04/18/2008 [facility] Patient Information 2014 [place], [place].  _______________________________________________________________________

## 2020-10-05 ENCOUNTER — Encounter (HOSPITAL_COMMUNITY): Payer: Self-pay

## 2020-10-05 ENCOUNTER — Encounter (HOSPITAL_COMMUNITY)
Admission: RE | Admit: 2020-10-05 | Discharge: 2020-10-05 | Disposition: A | Discharge status: Home / Self Care | Payer: Medicare Other | Source: Ambulatory Visit | Attending: Orthopaedic Surgery | Admitting: Orthopaedic Surgery

## 2020-10-05 ENCOUNTER — Other Ambulatory Visit: Payer: Self-pay

## 2020-10-05 DIAGNOSIS — Z7982 Long term (current) use of aspirin: Secondary | ICD-10-CM | POA: Diagnosis not present

## 2020-10-05 DIAGNOSIS — K219 Gastro-esophageal reflux disease without esophagitis: Secondary | ICD-10-CM | POA: Diagnosis not present

## 2020-10-05 DIAGNOSIS — Z79899 Other long term (current) drug therapy: Secondary | ICD-10-CM | POA: Diagnosis not present

## 2020-10-05 DIAGNOSIS — F329 Major depressive disorder, single episode, unspecified: Secondary | ICD-10-CM | POA: Insufficient documentation

## 2020-10-05 DIAGNOSIS — E669 Obesity, unspecified: Secondary | ICD-10-CM | POA: Insufficient documentation

## 2020-10-05 DIAGNOSIS — F419 Anxiety disorder, unspecified: Secondary | ICD-10-CM | POA: Insufficient documentation

## 2020-10-05 DIAGNOSIS — Z01818 Encounter for other preprocedural examination: Secondary | ICD-10-CM | POA: Diagnosis not present

## 2020-10-05 DIAGNOSIS — Z87891 Personal history of nicotine dependence: Secondary | ICD-10-CM | POA: Diagnosis not present

## 2020-10-05 DIAGNOSIS — I2510 Atherosclerotic heart disease of native coronary artery without angina pectoris: Secondary | ICD-10-CM | POA: Diagnosis not present

## 2020-10-05 DIAGNOSIS — Z6832 Body mass index (BMI) 32.0-32.9, adult: Secondary | ICD-10-CM | POA: Insufficient documentation

## 2020-10-05 DIAGNOSIS — Z7901 Long term (current) use of anticoagulants: Secondary | ICD-10-CM | POA: Diagnosis not present

## 2020-10-05 DIAGNOSIS — M1611 Unilateral primary osteoarthritis, right hip: Secondary | ICD-10-CM | POA: Diagnosis not present

## 2020-10-05 DIAGNOSIS — I10 Essential (primary) hypertension: Secondary | ICD-10-CM | POA: Diagnosis not present

## 2020-10-05 DIAGNOSIS — I252 Old myocardial infarction: Secondary | ICD-10-CM | POA: Insufficient documentation

## 2020-10-05 DIAGNOSIS — E785 Hyperlipidemia, unspecified: Secondary | ICD-10-CM | POA: Diagnosis not present

## 2020-10-05 LAB — CBC
HCT: 44.2 % (ref 39.0–52.0)
Hemoglobin: 14.7 g/dL (ref 13.0–17.0)
MCH: 30.9 pg (ref 26.0–34.0)
MCHC: 33.3 g/dL (ref 30.0–36.0)
MCV: 93.1 fL (ref 80.0–100.0)
Platelets: 209 10*3/uL (ref 150–400)
RBC: 4.75 MIL/uL (ref 4.22–5.81)
RDW: 12.9 % (ref 11.5–15.5)
WBC: 7 10*3/uL (ref 4.0–10.5)
nRBC: 0 % (ref 0.0–0.2)

## 2020-10-05 LAB — BASIC METABOLIC PANEL
Anion gap: 10 (ref 5–15)
BUN: 20 mg/dL (ref 8–23)
CO2: 24 mmol/L (ref 22–32)
Calcium: 9.5 mg/dL (ref 8.9–10.3)
Chloride: 105 mmol/L (ref 98–111)
Creatinine, Ser: 1.17 mg/dL (ref 0.61–1.24)
GFR, Estimated: >60 mL/min (ref >60)
Glucose, Bld: 93 mg/dL (ref 70–99)
Potassium: 5 mmol/L (ref 3.5–5.1)
Sodium: 139 mmol/L (ref 135–145)

## 2020-10-05 LAB — SURGICAL PCR SCREEN
MRSA, PCR: NEGATIVE
Staphylococcus aureus: NEGATIVE

## 2020-10-09 NOTE — Anesthesia Preprocedure Evaluation (Signed)
Anesthesia Evaluation  Patient identified by MRN, date of birth, ID band Patient awake    Reviewed: Allergy & Precautions, NPO status , Patient's Chart, lab work & pertinent test results, reviewed documented beta blocker date and time   History of Anesthesia Complications Negative for: history of anesthetic complications  Airway Mallampati: III  TM Distance: >3 FB Neck ROM: Full    Dental no notable dental hx.    Pulmonary former smoker,    Pulmonary exam normal        Cardiovascular hypertension, Pt. on home beta blockers and Pt. on medications + CAD, + Past MI and + Cardiac Stents (2016)  Normal cardiovascular exam  Nuclear stress 2019: EF 62%, small defect of moderate severity present in the basal inferior and mid inferior location consistent with prior mid-basilar inferior wall myocardial infarction, low risk study     Neuro/Psych Anxiety Depression negative neurological ROS     GI/Hepatic Neg liver ROS, GERD  Controlled and Medicated,  Endo/Other  negative endocrine ROS  Renal/GU negative Renal ROS  negative genitourinary   Musculoskeletal  (+) Arthritis ,   Abdominal   Peds  Hematology negative hematology ROS (+)   Anesthesia Other Findings Day of surgery medications reviewed with patient.  Reproductive/Obstetrics negative OB ROS                          Anesthesia Physical Anesthesia Plan  ASA: III  Anesthesia Plan: Spinal   Post-op Pain Management:    Induction:   PONV Risk Score and Plan: 2 and Treatment may vary due to age or medical condition, Propofol infusion, Ondansetron and Dexamethasone  Airway Management Planned: Natural Airway and Simple Face Mask  Additional Equipment: None  Intra-op Plan:   Post-operative Plan:   Informed Consent: I have reviewed the patients History and Physical, chart, labs and discussed the procedure including the risks, benefits and  alternatives for the proposed anesthesia with the patient or authorized representative who has indicated his/her understanding and acceptance.       Plan Discussed with: CRNA  Anesthesia Plan Comments: (See PAT note 10/05/2020, [name], PA-C)      Anesthesia Quick Evaluation

## 2020-10-09 NOTE — Progress Notes (Signed)
Anesthesia Chart Review   Case: [id number] Date/Time: 10/13/20 0830   Procedure: RIGHT TOTAL HIP ARTHROPLASTY ANTERIOR APPROACH (Right Hip) - 3E bed   Anesthesia type: Spinal   Pre-op diagnosis: osteoarthritis right hip   Location: [facility] ROOM 09 / [facility]   Surgeons: Mcarthur Rossetti, MD      DISCUSSION:74 y.o. former smoker with h/o CAD (inferior MI, s/p DES RCA 05/17/15), HLD, GERD, anxiety, depression (suicide attempt 07/13/07), BPH, BMI is consistent with obesity, right hip OA scheduled for above procedure 10/13/2020 with Dr. Jean Rosenthal.   S/p lumbar laminectomy and foraminotomy 01/24/2020 with no anesthesia complications noted.   Prior to this procedure he was evaluated by cardiology. Per OV note, "Chart reviewed as part of pre-operative protocol coverage. Patient was contacted 01/14/2020 in reference to pre-operative risk assessment for pending surgery as outlined below.  Randy Peterson was last seen on 10/01/19 by Dr. Percival Spanish.  Since that day, Randy Peterson has done fine from a cardiac standpoint. His back pain has limited him some in activity, though he can still complete 4 METs without anginal complaints. Therefore, based on ACC/AHA guidelines, the patient would be at acceptable risk for the planned procedure without further cardiovascular testing.  Per Dr. Percival Spanish, if the procedure cannot be done on aspirin, he can hold 3 days prior to his surgery with plans to restart when cleared to do so by Dr. Saintclair Halsted."  Last seen by cardiology 10/01/2019, stable at this visit with 18 month follow up scheduled.    Anticipate pt can proceed with planned procedure barring acute status change.   VS: BP (!) 141/88   Pulse 67   Temp 36.9 C (Oral)   Resp 16   Ht 5\' 7"  (1.702 m)   Wt 93.2 kg   SpO2 100%   BMI 32.17 kg/m   PROVIDERS: Derinda Late, MD is PCP   Minus Breeding, MD is Cardiologist  LABS: Labs reviewed: Acceptable for surgery. (all labs ordered are listed, but only  abnormal results are displayed)  Labs Reviewed  SURGICAL PCR SCREEN  BASIC METABOLIC PANEL  CBC  TYPE AND SCREEN     IMAGES:   EKG: 10/04/2019 Rate 55 bpm  Sinus bradycardia   CV: Stress Test 07/15/2018  Nuclear stress EF: 62%. The left ventricular ejection fraction is normal (55-65%).  There was no ST segment deviation noted during stress.  Defect 1: There is a small defect of moderate severity present in the basal inferior and mid inferior location.  Findings consistent with prior mid-basilar Inferior wall myocardial infarction. There is no ischmeia  This is a low risk study.   Echo 05/17/2015 Study Conclusions   - Left ventricle: The cavity size was normal. Wall thickness was  normal. Systolic function was normal. The estimated ejection  fraction was in the range of 50% to 55%. Wall motion was normal;  there were no regional wall motion abnormalities. Features are  consistent with a pseudonormal left ventricular filling pattern,  with concomitant abnormal relaxation and increased filling  pressure (grade 2 diastolic dysfunction).  - Mitral valve: There was mild regurgitation.  - Right ventricle: The cavity size was mildly dilated. Systolic  function was mildly to moderately reduced.  - Pulmonary arteries: Systolic pressure was mildly increased. PA  peak pressure: 33 mm Hg (S).  Past Medical History:  Diagnosis Date  . Acid reflux   . Acute MI, inferior wall, initial episode of care ([facility]) 05/17/15  . Angina decubitus ([facility]) 05/16/15  . Anxiety   .  Arthritis    both shoulders and back  . CAD in native artery    per cath 05/16/14  . Depression   . Hyperlipidemia LDL goal <70   . Prostate enlargement   . S/P coronary artery stent placement, DES promus premier, RCA 05/17/15 05/18/2015    Past Surgical History:  Procedure Laterality Date  . CARDIAC CATHETERIZATION N/A 05/17/2015   Procedure: Left Heart Cath and Coronary Angiography;  Surgeon:  Sherren Mocha, MD;  Location: [facility];  Service: Cardiovascular;  Laterality: N/A;  . CARDIAC CATHETERIZATION N/A 05/17/2015   Procedure: Coronary Stent Intervention;  Surgeon: Sherren Mocha, MD;  Location: [facility];  Service: Cardiovascular;  Laterality: N/A;  . LUMBAR LAMINECTOMY/DECOMPRESSION MICRODISCECTOMY Bilateral 01/24/2020   Procedure: Laminectomy and Foraminotomy - bilateral - Lumbar Three-Four, Lumbar Four-Five.;  Surgeon: Kary Kos, MD;  Location: [facility];  Service: Neurosurgery;  Laterality: Bilateral;  posterior  . None      MEDICATIONS: . acetaminophen (TYLENOL) 325 MG tablet  . ALPRAZolam (XANAX) 0.5 MG tablet  . aspirin EC 81 MG tablet  . atorvastatin (LIPITOR) 80 MG tablet  . buPROPion (WELLBUTRIN XL) 150 MG 24 hr tablet  . cholecalciferol (VITAMIN D) 1000 UNITS tablet  . DULoxetine (CYMBALTA) 20 MG capsule  . esomeprazole (NEXIUM) 40 MG capsule  . ezetimibe (ZETIA) 10 MG tablet  . finasteride (PROSCAR) 5 MG tablet  . metoprolol tartrate (LOPRESSOR) 25 MG tablet  . nitroGLYCERIN (NITROSTAT) 0.4 MG SL tablet  . tamsulosin (FLOMAX) 0.4 MG CAPS capsule  . XIIDRA 5 % SOLN   No current facility-administered medications for this encounter.     [name], PA-C [facility] Pre-Surgical Testing [phone]

## 2020-10-11 ENCOUNTER — Other Ambulatory Visit (HOSPITAL_COMMUNITY)
Admission: RE | Admit: 2020-10-11 | Discharge: 2020-10-11 | Disposition: A | Discharge status: Home / Self Care | Payer: Medicare Other | Source: Ambulatory Visit | Attending: Orthopaedic Surgery | Admitting: Orthopaedic Surgery

## 2020-10-11 DIAGNOSIS — Z20822 Contact with and (suspected) exposure to covid-19: Secondary | ICD-10-CM | POA: Diagnosis not present

## 2020-10-11 DIAGNOSIS — Z01812 Encounter for preprocedural laboratory examination: Secondary | ICD-10-CM | POA: Insufficient documentation

## 2020-10-12 ENCOUNTER — Telehealth: Payer: Self-pay | Admitting: *Deleted

## 2020-10-12 LAB — SARS CORONAVIRUS 2 (TAT 6-24 HRS): SARS Coronavirus 2: NEGATIVE

## 2020-10-12 NOTE — Telephone Encounter (Signed)
Ortho bundle RNCM call to patient to discuss his upcoming Right total hip arthroplasty on 10/13/20 with Dr. Ninfa Linden. Patient is aware that he is scheduled for same day discharge and is agreeable. He will be going home with his spouse, who will be able to assist. Patient will need a FWW and has elevated toilet seat in the home. Referral made to [facility] [place] to be delivered to PACU prior to discharge. Referral made to [facility] from office after choice provided. If any questions or concerns regarding discharge plan, please call Jamse Arn, [place]. [phone].

## 2020-10-12 NOTE — H&P (Signed)
TOTAL HIP ADMISSION H&P  Patient is admitted for right total hip arthroplasty.  Subjective:  Chief Complaint: right hip pain  HPI: Randy Peterson, 75 y.o. male, has a history of pain and functional disability in the right hip(s) due to arthritis and patient has failed non-surgical conservative treatments for greater than 12 weeks to include NSAID's and/or analgesics, corticosteriod injections, flexibility and strengthening excercises and activity modification.  Onset of symptoms was gradual starting 4 years ago with gradually worsening course since that time.The patient noted no past surgery on the right hip(s).  Patient currently rates pain in the right hip at 10 out of 10 with activity. Patient has night pain, worsening of pain with activity and weight bearing, trendelenberg gait, pain that interfers with activities of daily living and pain with passive range of motion. Patient has evidence of subchondral sclerosis, periarticular osteophytes and joint space narrowing by imaging studies. This condition presents safety issues increasing the risk of falls.  There is no current active infection.  Patient Active Problem List   Diagnosis Date Noted  . Unilateral primary osteoarthritis, right hip 06/06/2020  . Spinal stenosis at L4-L5 level 01/24/2020  . Educated about COVID-19 virus infection 09/30/2019  . Coronary artery disease involving native coronary artery of native heart without angina pectoris 10/15/2018  . Dyslipidemia 01/28/2017  . Diverticulosis 10/29/2016  . History of adenomatous polyp of colon 10/29/2016  . Internal hemorrhoids 10/29/2016  . Primary osteoarthritis, right shoulder 09/13/2016  . CAD S/P PCI 05/18/2015  . History of NSTEMI 05/16/2015  . Generalized anxiety disorder 02/04/2012  . Major depressive disorder, recurrent episode, mild ([place]) 02/04/2012  . Major depressive disorder, recurrent episode, severe, without mention of psychotic behavior 12/12/2011  . Chest pain  with moderate risk for cardiac etiology 08/07/2009   Past Medical History:  Diagnosis Date  . Acid reflux   . Acute MI, inferior wall, initial episode of care ([place]) 05/17/15  . Angina decubitus ([place]) 05/16/15  . Anxiety   . Arthritis    both shoulders and back  . CAD in native artery    per cath 05/16/14  . Depression   . Hyperlipidemia LDL goal <70   . Prostate enlargement   . S/P coronary artery stent placement, DES promus premier, RCA 05/17/15 05/18/2015    Past Surgical History:  Procedure Laterality Date  . CARDIAC CATHETERIZATION N/A 05/17/2015   Procedure: Left Heart Cath and Coronary Angiography;  Surgeon: Sherren Mocha, MD;  Location: [facility];  Service: Cardiovascular;  Laterality: N/A;  . CARDIAC CATHETERIZATION N/A 05/17/2015   Procedure: Coronary Stent Intervention;  Surgeon: Sherren Mocha, MD;  Location: [facility];  Service: Cardiovascular;  Laterality: N/A;  . LUMBAR LAMINECTOMY/DECOMPRESSION MICRODISCECTOMY Bilateral 01/24/2020   Procedure: Laminectomy and Foraminotomy - bilateral - Lumbar Three-Four, Lumbar Four-Five.;  Surgeon: Kary Kos, MD;  Location: [facility];  Service: Neurosurgery;  Laterality: Bilateral;  posterior  . None      No current facility-administered medications for this encounter.   Current Outpatient Medications  Medication Sig Dispense Refill Last Dose  . acetaminophen (TYLENOL) 325 MG tablet Take 2 tablets (650 mg total) by mouth every 4 (four) hours as needed for headache or mild pain.     Marland Kitchen ALPRAZolam (XANAX) 0.5 MG tablet Take one tab as needed for severe anxiety (Patient taking differently: Take by mouth daily as needed for anxiety. Take one tab as needed for severe anxiety) 15 tablet 0   . aspirin EC 81 MG tablet  Take 1 tablet (81 mg total) by mouth daily. 90 tablet 3   . atorvastatin (LIPITOR) 80 MG tablet Take 1 tablet (80 mg total) by mouth daily at 6 PM. 30 tablet 6   . buPROPion (WELLBUTRIN XL) 150 MG 24 hr tablet Take 1  tablet (150 mg total) by mouth daily. 90 tablet 0   . cholecalciferol (VITAMIN D) 1000 UNITS tablet Take 1,000 Units by mouth daily.     . DULoxetine (CYMBALTA) 20 MG capsule Take 1 capsule (20 mg total) by mouth daily. 90 capsule 0   . esomeprazole (NEXIUM) 40 MG capsule Take 40 mg by mouth 2 (two) times daily before a meal.     . ezetimibe (ZETIA) 10 MG tablet Take 10 mg by mouth every evening.     . finasteride (PROSCAR) 5 MG tablet Take 5 mg by mouth every evening.      . metoprolol tartrate (LOPRESSOR) 25 MG tablet Take 1 tablet (25 mg total) by mouth 2 (two) times daily. 180 tablet 1   . nitroGLYCERIN (NITROSTAT) 0.4 MG SL tablet PLACE 1 TABLET UNDER THE TONGUE EVERY 5 MINUTES AS NEEDED FOR CHEST PAIN (Patient taking differently: Place 0.4 mg under the tongue every 5 (five) minutes as needed for chest pain.) 25 tablet 3   . tamsulosin (FLOMAX) 0.4 MG CAPS capsule Take 0.4 mg by mouth every evening.      Marland Kitchen XIIDRA 5 % SOLN Place 1 drop into both eyes daily.      No Known Allergies  Social History   Tobacco Use  . Smoking status: Former Smoker    Packs/day: 0.25    Years: 40.00    Pack years: 10.00    Types: Cigarettes  . Smokeless tobacco: Never Used  . Tobacco comment: Quit 20 years ago  Substance Use Topics  . Alcohol use: Yes    Alcohol/week: 2.0 standard drinks    Types: 2 Cans of beer per week    Family History  Problem Relation Age of Onset  . Dementia Mother   . Stroke Mother   . Heart attack Father 4  . Suicidality Brother   . Depression Brother   . Heart attack Brother 47  . Hypertension Neg Hx      Review of Systems  Musculoskeletal: Positive for gait problem.  All other systems reviewed and are negative.   Objective:  Physical Exam Vitals reviewed.  Constitutional:      Appearance: Normal appearance.  HENT:     Head: Normocephalic and atraumatic.  Eyes:     Extraocular Movements: Extraocular movements intact.     Pupils: Pupils are equal, round,  and reactive to light.  Cardiovascular:     Rate and Rhythm: Normal rate and regular rhythm.     Pulses: Normal pulses.  Pulmonary:     Effort: Pulmonary effort is normal.  Abdominal:     Palpations: Abdomen is soft.  Musculoskeletal:     Cervical back: Normal range of motion and neck supple.     Right hip: Tenderness and bony tenderness present. Decreased range of motion. Decreased strength.  Neurological:     Mental Status: He is alert and oriented to person, place, and time.  Psychiatric:        Behavior: Behavior normal.     Vital signs in last 24 hours:    Labs:   Estimated body mass index is 32.17 kg/m as calculated from the following:   Height as of 10/05/20: 5\' 7"  (1.702  m).   Weight as of 10/05/20: 93.2 kg.   Imaging Review Plain radiographs demonstrate severe degenerative joint disease of the right hip(s). The bone quality appears to be excellent for age and reported activity level.      Assessment/Plan:  End stage arthritis, right hip(s)  The patient history, physical examination, clinical judgement of the provider and imaging studies are consistent with end stage degenerative joint disease of the right hip(s) and total hip arthroplasty is deemed medically necessary. The treatment options including medical management, injection therapy, arthroscopy and arthroplasty were discussed at length. The risks and benefits of total hip arthroplasty were presented and reviewed. The risks due to aseptic loosening, infection, stiffness, dislocation/subluxation,  thromboembolic complications and other imponderables were discussed.  The patient acknowledged the explanation, agreed to proceed with the plan and consent was signed. Patient is being admitted for inpatient treatment for surgery, pain control, PT, OT, prophylactic antibiotics, VTE prophylaxis, progressive ambulation and ADL's and discharge planning.The patient is planning to be discharged home with home health  services

## 2020-10-13 ENCOUNTER — Ambulatory Visit (HOSPITAL_COMMUNITY)
Admission: RE | Admit: 2020-10-13 | Discharge: 2020-10-13 | Disposition: A | Discharge status: Home / Self Care | Payer: Medicare Other | Attending: Orthopaedic Surgery | Admitting: Orthopaedic Surgery

## 2020-10-13 ENCOUNTER — Ambulatory Visit (HOSPITAL_COMMUNITY): Payer: Medicare Other

## 2020-10-13 ENCOUNTER — Ambulatory Visit (HOSPITAL_COMMUNITY): Payer: Medicare Other | Admitting: Anesthesiology

## 2020-10-13 ENCOUNTER — Encounter (HOSPITAL_COMMUNITY)
Admission: RE | Disposition: A | Discharge status: Home / Self Care | Payer: Self-pay | Source: Home / Self Care | Attending: Orthopaedic Surgery

## 2020-10-13 ENCOUNTER — Ambulatory Visit (HOSPITAL_COMMUNITY): Payer: Medicare Other | Admitting: Physician Assistant

## 2020-10-13 ENCOUNTER — Other Ambulatory Visit: Payer: Self-pay | Admitting: Orthopaedic Surgery

## 2020-10-13 ENCOUNTER — Encounter (HOSPITAL_COMMUNITY): Payer: Self-pay | Admitting: Orthopaedic Surgery

## 2020-10-13 DIAGNOSIS — Z79899 Other long term (current) drug therapy: Secondary | ICD-10-CM | POA: Insufficient documentation

## 2020-10-13 DIAGNOSIS — Z87891 Personal history of nicotine dependence: Secondary | ICD-10-CM | POA: Insufficient documentation

## 2020-10-13 DIAGNOSIS — Z7982 Long term (current) use of aspirin: Secondary | ICD-10-CM | POA: Insufficient documentation

## 2020-10-13 DIAGNOSIS — M1611 Unilateral primary osteoarthritis, right hip: Secondary | ICD-10-CM | POA: Diagnosis present

## 2020-10-13 DIAGNOSIS — Z96641 Presence of right artificial hip joint: Secondary | ICD-10-CM

## 2020-10-13 DIAGNOSIS — Z419 Encounter for procedure for purposes other than remedying health state, unspecified: Secondary | ICD-10-CM

## 2020-10-13 DIAGNOSIS — M25751 Osteophyte, right hip: Secondary | ICD-10-CM | POA: Insufficient documentation

## 2020-10-13 HISTORY — PX: TOTAL HIP ARTHROPLASTY: SHX124

## 2020-10-13 LAB — TYPE AND SCREEN
ABO/RH(D): A POS
Antibody Screen: NEGATIVE

## 2020-10-13 LAB — ABO/RH: ABO/RH(D): A POS

## 2020-10-13 SURGERY — ARTHROPLASTY, HIP, TOTAL, ANTERIOR APPROACH
Anesthesia: Spinal | Site: Hip | Laterality: Right

## 2020-10-13 MED ORDER — LACTATED RINGERS IV BOLUS
500.0000 mL | Freq: Once | INTRAVENOUS | Status: AC
  Administered 2020-10-13: 500 mL via INTRAVENOUS

## 2020-10-13 MED ORDER — BUPIVACAINE HCL (PF) 0.25 % IJ SOLN
INTRAMUSCULAR | Status: DC | PRN
  Administered 2020-10-13: 50 mL

## 2020-10-13 MED ORDER — DEXAMETHASONE SODIUM PHOSPHATE 10 MG/ML IJ SOLN
INTRAMUSCULAR | Status: DC | PRN
  Administered 2020-10-13: 8 mg via INTRAVENOUS

## 2020-10-13 MED ORDER — PHENYLEPHRINE HCL-NACL 20-0.9 MG/250ML-% IV SOLN
INTRAVENOUS | Status: DC | PRN
  Administered 2020-10-13: 25 ug/min via INTRAVENOUS

## 2020-10-13 MED ORDER — ACETAMINOPHEN 500 MG PO TABS
1000.0000 mg | ORAL_TABLET | Freq: Once | ORAL | Status: AC
  Administered 2020-10-13: 1000 mg via ORAL
  Filled 2020-10-13: qty 2

## 2020-10-13 MED ORDER — BUPIVACAINE HCL 0.25 % IJ SOLN
INTRAMUSCULAR | Status: AC
  Filled 2020-10-13: qty 1

## 2020-10-13 MED ORDER — ASPIRIN 81 MG PO CHEW: 81.0000 mg | CHEWABLE_TABLET | Freq: Two times a day (BID) | ORAL | 0 refills | Status: DC

## 2020-10-13 MED ORDER — OXYCODONE HCL 5 MG PO TABS: 5.0000 mg | ORAL_TABLET | Freq: Once | ORAL | Status: DC | PRN

## 2020-10-13 MED ORDER — SODIUM CHLORIDE 0.9 % IR SOLN
Status: DC | PRN
  Administered 2020-10-13: 1000 mL

## 2020-10-13 MED ORDER — PROPOFOL 1000 MG/100ML IV EMUL
INTRAVENOUS | Status: AC
  Filled 2020-10-13: qty 100

## 2020-10-13 MED ORDER — PROMETHAZINE HCL 25 MG/ML IJ SOLN: 6.2500 mg | INTRAMUSCULAR | Status: DC | PRN

## 2020-10-13 MED ORDER — DEXAMETHASONE SODIUM PHOSPHATE 10 MG/ML IJ SOLN
INTRAMUSCULAR | Status: AC
  Filled 2020-10-13: qty 1

## 2020-10-13 MED ORDER — FENTANYL CITRATE (PF) 100 MCG/2ML IJ SOLN: 25.0000 ug | INTRAMUSCULAR | Status: DC | PRN

## 2020-10-13 MED ORDER — ORAL CARE MOUTH RINSE: 15.0000 mL | Freq: Once | OROMUCOSAL | Status: AC

## 2020-10-13 MED ORDER — TRANEXAMIC ACID-NACL 1000-0.7 MG/100ML-% IV SOLN
1000.0000 mg | INTRAVENOUS | Status: AC
  Administered 2020-10-13: 1000 mg via INTRAVENOUS
  Filled 2020-10-13: qty 100

## 2020-10-13 MED ORDER — EPHEDRINE 5 MG/ML INJ
INTRAVENOUS | Status: AC
  Filled 2020-10-13: qty 10

## 2020-10-13 MED ORDER — CHLORHEXIDINE GLUCONATE 0.12 % MT SOLN
15.0000 mL | Freq: Once | OROMUCOSAL | Status: AC
  Administered 2020-10-13: 15 mL via OROMUCOSAL

## 2020-10-13 MED ORDER — STERILE WATER FOR IRRIGATION IR SOLN
Status: DC | PRN
  Administered 2020-10-13: 2000 mL

## 2020-10-13 MED ORDER — ONDANSETRON HCL 4 MG/2ML IJ SOLN
INTRAMUSCULAR | Status: AC
  Filled 2020-10-13: qty 2

## 2020-10-13 MED ORDER — METHOCARBAMOL 500 MG PO TABS: 500.0000 mg | ORAL_TABLET | Freq: Four times a day (QID) | ORAL | 1 refills | Status: DC | PRN

## 2020-10-13 MED ORDER — PROPOFOL 500 MG/50ML IV EMUL
INTRAVENOUS | Status: DC | PRN
  Administered 2020-10-13: 60 ug/kg/min via INTRAVENOUS

## 2020-10-13 MED ORDER — LACTATED RINGERS IV SOLN: INTRAVENOUS | Status: DC

## 2020-10-13 MED ORDER — POVIDONE-IODINE 10 % EX SWAB
2.0000 "application " | Freq: Once | CUTANEOUS | Status: AC
  Administered 2020-10-13: 2 via TOPICAL

## 2020-10-13 MED ORDER — PHENYLEPHRINE 40 MCG/ML (10ML) SYRINGE FOR IV PUSH (FOR BLOOD PRESSURE SUPPORT)
PREFILLED_SYRINGE | INTRAVENOUS | Status: AC
  Filled 2020-10-13: qty 10

## 2020-10-13 MED ORDER — FENTANYL CITRATE (PF) 100 MCG/2ML IJ SOLN
INTRAMUSCULAR | Status: DC | PRN
  Administered 2020-10-13: 50 ug via INTRAVENOUS

## 2020-10-13 MED ORDER — OXYCODONE HCL 5 MG/5ML PO SOLN: 5.0000 mg | Freq: Once | ORAL | Status: DC | PRN

## 2020-10-13 MED ORDER — ONDANSETRON HCL 4 MG PO TABS: 4.0000 mg | ORAL_TABLET | Freq: Three times a day (TID) | ORAL | 0 refills | Status: DC | PRN

## 2020-10-13 MED ORDER — 0.9 % SODIUM CHLORIDE (POUR BTL) OPTIME
TOPICAL | Status: DC | PRN
  Administered 2020-10-13: 1000 mL

## 2020-10-13 MED ORDER — OXYCODONE HCL 5 MG PO TABS: 5.0000 mg | ORAL_TABLET | ORAL | 0 refills | Status: DC | PRN

## 2020-10-13 MED ORDER — CEFAZOLIN SODIUM-DEXTROSE 2-4 GM/100ML-% IV SOLN
2.0000 g | INTRAVENOUS | Status: AC
  Administered 2020-10-13: 2 g via INTRAVENOUS
  Filled 2020-10-13: qty 100

## 2020-10-13 MED ORDER — FENTANYL CITRATE (PF) 100 MCG/2ML IJ SOLN
INTRAMUSCULAR | Status: AC
  Filled 2020-10-13: qty 2

## 2020-10-13 MED ORDER — BUPIVACAINE IN DEXTROSE 0.75-8.25 % IT SOLN
INTRATHECAL | Status: DC | PRN
  Administered 2020-10-13: 1.6 mL via INTRATHECAL

## 2020-10-13 MED ORDER — LACTATED RINGERS IV BOLUS: 250.0000 mL | Freq: Once | INTRAVENOUS | Status: DC

## 2020-10-13 MED ORDER — MIDAZOLAM HCL 5 MG/5ML IJ SOLN
INTRAMUSCULAR | Status: DC | PRN
  Administered 2020-10-13: 2 mg via INTRAVENOUS

## 2020-10-13 MED ORDER — ONDANSETRON HCL 4 MG/2ML IJ SOLN
INTRAMUSCULAR | Status: DC | PRN
  Administered 2020-10-13: 4 mg via INTRAVENOUS

## 2020-10-13 MED ORDER — MIDAZOLAM HCL 2 MG/2ML IJ SOLN
INTRAMUSCULAR | Status: AC
  Filled 2020-10-13: qty 2

## 2020-10-13 SURGICAL SUPPLY — 39 items
ARTICULEZE HEAD (Hips) ×2 IMPLANT
BAG ZIPLOCK 12X15 (MISCELLANEOUS) IMPLANT
BLADE SAW SGTL 18X1.27X75 (BLADE) ×2 IMPLANT
CATH COUDE 5CC RIBBED (CATHETERS) ×1 IMPLANT
CATH RIBBED COUDE 5CC (CATHETERS) ×2
COVER PERINEAL POST (MISCELLANEOUS) ×2 IMPLANT
COVER SURGICAL LIGHT HANDLE (MISCELLANEOUS) ×2 IMPLANT
COVER WAND RF STERILE (DRAPES) ×2 IMPLANT
CUP ACET PNNCL SECTR W/GRIP 56 (Hips) ×1 IMPLANT
DRAPE STERI IOBAN 125X83 (DRAPES) ×2 IMPLANT
DRAPE U-SHAPE 47X51 STRL (DRAPES) ×4 IMPLANT
DRSG AQUACEL AG ADV 3.5X10 (GAUZE/BANDAGES/DRESSINGS) ×2 IMPLANT
DURAPREP 26ML APPLICATOR (WOUND CARE) ×2 IMPLANT
ELECT REM PT RETURN 15FT ADLT (MISCELLANEOUS) ×2 IMPLANT
GAUZE XEROFORM 1X8 LF (GAUZE/BANDAGES/DRESSINGS) ×2 IMPLANT
GLOVE BIO SURGEON STRL SZ7.5 (GLOVE) ×2 IMPLANT
GLOVE ECLIPSE 8.0 STRL XLNG CF (GLOVE) ×2 IMPLANT
GLOVE SRG 8 PF TXTR STRL LF DI (GLOVE) ×2 IMPLANT
GLOVE SURG UNDER POLY LF SZ8 (GLOVE) ×4
GOWN STRL REUS W/TWL XL LVL3 (GOWN DISPOSABLE) ×4 IMPLANT
HANDPIECE INTERPULSE COAX TIP (DISPOSABLE) ×2
HEAD ARTICULEZE (Hips) ×1 IMPLANT
HOLDER FOLEY CATH W/STRAP (MISCELLANEOUS) ×2 IMPLANT
KIT TURNOVER KIT A (KITS) ×2 IMPLANT
PACK ANTERIOR HIP CUSTOM (KITS) ×2 IMPLANT
PENCIL SMOKE EVACUATOR (MISCELLANEOUS) ×2 IMPLANT
PINN SECTOR W/GRIP ACE CUP 56 (Hips) ×2 IMPLANT
PINNACLE ALTRX PLUS 4 N 36X56 (Hips) ×2 IMPLANT
SET HNDPC FAN SPRY TIP SCT (DISPOSABLE) ×1 IMPLANT
STAPLER VISISTAT 35W (STAPLE) ×2 IMPLANT
STEM AMT HIGH CORAIL SZ13X135 (Stem) ×2 IMPLANT
SUT ETHIBOND NAB CT1 #1 30IN (SUTURE) ×2 IMPLANT
SUT ETHILON 2 0 PS N (SUTURE) IMPLANT
SUT MNCRL AB 4-0 PS2 18 (SUTURE) IMPLANT
SUT VIC AB 0 CT1 36 (SUTURE) ×2 IMPLANT
SUT VIC AB 1 CT1 36 (SUTURE) ×2 IMPLANT
SUT VIC AB 2-0 CT1 27 (SUTURE) ×4
SUT VIC AB 2-0 CT1 TAPERPNT 27 (SUTURE) ×2 IMPLANT
TRAY FOLEY MTR SLVR 16FR STAT (SET/KITS/TRAYS/PACK) IMPLANT

## 2020-10-13 NOTE — Op Note (Signed)
Randy Peterson, Randy Peterson MEDICAL RECORD [id number] ACCOUNT [id number] DATE OF BIRTH:01-03-1946 FACILITY: [facility] LOCATION: [facility] PHYSICIAN:Mance Vallejo Kerry Fort, MD  OPERATIVE REPORT  DATE OF PROCEDURE:  10/13/2020  PREOPERATIVE DIAGNOSES:  Primary osteoarthritis and degenerative joint disease, right hip.  POSTOPERATIVE DIAGNOSES:  Primary osteoarthritis and degenerative joint disease, right hip.  PROCEDURE:  Right total hip arthroplasty through direct anterior approach.  IMPLANTS:  DePuy Sector Gription acetabular component size 56, size 36+4 neutral polyethylene liner, size 13 Corail femoral component with high offset, size 36+5 metal hip ball.  SURGEON:  Lind Guest. Ninfa Linden, MD  ASSISTANT:  Erskine Emery, PA-C  ANESTHESIA:  Spinal.  ANTIBIOTICS:  Two grams IV Ancef.  BLOOD LOSS:  150 mL.  COMPLICATIONS:  None.  INDICATIONS:  The patient is a 75 year old gentleman with debilitating arthritis involving his right hip that has been well documented with clinical exam and x-rays.  He has tried and failed all forms of conservative treatment for over a year.  At this  point, his right hip pain is debilitating and it is detrimentally affecting his activities of daily living, quality of life and his mobility.  We talked in detail about hip replacement surgery and have recommended this to him and he also agrees  We did  talk about the risk of acute blood loss anemia, nerve and vessel injury, fracture, infection, dislocation, DVT, implant failure and skin and soft tissue issues.  We talked about our goals being decrease pain, improve mobility and overall improve quality  of life.  DESCRIPTION OF PROCEDURE:  After informed consent was obtained and appropriate right hip was marked, he was brought to the operating room and sat up on the stretcher where spinal anesthesia was obtained.  He was laid in the supine position on a  stretcher.  Foley catheter was placed.  I assessed his leg  lengths and found his right side being slightly shorter than the left.  Traction boots were placed on both his feet.  Next, he was placed supine on the Hana fracture table, the perineal post in  place and both legs in line skeletal traction device and no traction applied.  His right operative hip was prepped and draped with DuraPrep and sterile drapes.  A timeout was called, and he was identified as correct patient, correct right hip.  I then  made an incision just inferior and posterior to the anterior superior iliac spine and carried this obliquely down the leg.  Dissected down tensor fascia lata muscle.  Tensor fascia was then divided longitudinally to proceed with direct anterior approach  to the hip.  We identified and cauterized circumflex vessels and identified the hip capsule, entered the hip capsule in an L-type format, finding moderate joint effusion and significant periarticular osteophytes around the femoral head and neck.  We  placed Cobra retractor around the medial and lateral femoral neck and made our femoral neck cut with oscillating saw and completed this with an osteotome.  We placed a corkscrew guide in the femoral head and removed the femoral head in its entirety and  found a wide area devoid of cartilage.  I then placed a bent Hohmann over the medial acetabular rim and removed remnants of the acetabular labrum and other debris.  We then began broaching using the Corail broaching system from a size 8 going up to size  13.  With a size 13 in place, we trialed a standard offset femoral neck and a 36+1.5 hip ball, reduced this in the acetabulum and based  on his clinical exam and radiographic assessment, we felt like we needed a high offset stem with a little bit longer  leg length.  We dislocated the hip and removed the trial components.  We placed the real Corail femoral component size 13, but one with high offset, with a 36+5 metal hip ball, again reduced this in the acetabulum and it was  very tight.  We were pleased  with the leg length, offset, range of motion and stability,  assessed radiographically and mechanically.  We then irrigated the soft tissue with normal saline solution using pulsatile lavage.  We closed the joint capsule with interrupted #1 Ethibond  suture, followed by using #1 Vicryl to close the tensor fascia, 0 Vicryl was used to close deep tissue and 2-0 Vicryl was used for subcutaneous tissue.  Staples were used to close the skin.  We did infiltrate the incision with 0.25% plain Marcaine and  Aquacel dressing was applied.  He was taken off the Hana table and taken to recovery room in stable condition, with all final counts being correct.  No complications noted.  Of note, Benita Stabile, PA-C, assisted during the entire case and assistance was  crucial for facilitating every aspect of this case.  HN/NUANCE  D:10/13/2020 T:10/13/2020 JOB:[id number]

## 2020-10-13 NOTE — Discharge Instructions (Signed)

## 2020-10-13 NOTE — Anesthesia Procedure Notes (Signed)
Spinal  Start time: 10/13/2020 9:23 AM End time: 10/13/2020 9:28 AM Staffing Performed: resident/CRNA  Resident/CRNA: Victoriano Lain, CRNA Preanesthetic Checklist Completed: patient identified, IV checked, site marked, risks and benefits discussed, surgical consent, monitors and equipment checked, pre-op evaluation and timeout performed Spinal Block Patient position: sitting Prep: DuraPrep and site prepped and draped Patient monitoring: heart rate, continuous pulse ox and blood pressure Approach: midline Location: L2-3 Injection technique: single-shot Needle Needle type: Pencan  Needle gauge: 24 G Assessment Sensory level: T4 Additional Notes Spinal kit expiration date checked and verified. Pt placed in sitting position for spinal placement. One attempt, + clear CSF, - heme. Pt tolerated well. Placed supine after spinal placement.

## 2020-10-13 NOTE — Progress Notes (Signed)
Office RNCM call to patient to discuss his upcoming Right total hip arthroplasty on 10/13/20 with Dr. Ninfa Linden. Patient is aware that he is scheduled for same day discharge and is agreeable. He will be going home with his spouse, who will be able to assist. Patient will need a FWW and has elevated toilet seat already in the home. Referral made to [facility] for [facility] to be delivered to PACU prior to discharge. Referral made to [facility] from office after choice provided. Reviewed all post-op care instructions. If any questions or concerns regarding discharge plan, please call Jamse Arn, [place]. [phone].

## 2020-10-13 NOTE — Transfer of Care (Signed)
Immediate Anesthesia Transfer of Care Note  Patient: Randy Peterson  Procedure(s) Performed: RIGHT TOTAL HIP ARTHROPLASTY ANTERIOR APPROACH (Right Hip)  Patient Location: PACU  Anesthesia Type:Spinal  Level of Consciousness: awake, alert , oriented and patient cooperative  Airway & Oxygen Therapy: Patient Spontanous Breathing and Patient connected to face mask oxygen  Post-op Assessment: Report given to RN and Post -op Vital signs reviewed and stable  Post vital signs: Reviewed and stable  Last Vitals:  Vitals Value Taken Time  BP 85/73 10/13/20 1100  Temp    Pulse 57 10/13/20 1101  Resp 11 10/13/20 1101  SpO2 97 % 10/13/20 1101  Vitals shown include unvalidated device data.  Last Pain:  Vitals:   10/13/20 0706  TempSrc:   PainSc: 4       Patients Stated Pain Goal: 3 (44/92/01 0071)  Complications: No complications documented.

## 2020-10-13 NOTE — Brief Op Note (Signed)
10/13/2020  10:38 AM  PATIENT:  Si Raider  75 y.o. male  PRE-OPERATIVE DIAGNOSIS:  osteoarthritis right hip  POST-OPERATIVE DIAGNOSIS:  osteoarthritis right hip  PROCEDURE:  Procedure(s) with comments: RIGHT TOTAL HIP ARTHROPLASTY ANTERIOR APPROACH (Right) - 3E bed  SURGEON:  Surgeon(s) and Role:    Mcarthur Rossetti, MD - Primary  PHYSICIAN ASSISTANT:  Benita Stabile, PA-C  ANESTHESIA:   local and spinal  EBL:  150 mL   COUNTS:  YES  DICTATION: .Other Dictation: Dictation Number [id number]  PLAN OF CARE: Discharge to home after PACU  PATIENT DISPOSITION:  PACU - hemodynamically stable.   Delay start of Pharmacological VTE agent (>24hrs) due to surgical blood loss or risk of bleeding: no

## 2020-10-13 NOTE — Evaluation (Signed)
Physical Therapy Evaluation Patient Details Name: Randy Peterson MRN: [id number] DOB: March 29, 1946 Today's Date: 10/13/2020   History of Present Illness  Patient is 75 y.o. male s/p Rt THA anterior approach on 10/13/20 with PMH significant for CAD s/p stent, HLD, MI, OA, anxiety, depression.    Clinical Impression  [name] is a 75 y.o. male POD 0 s/p Rt THA. Patient reports independence with mobility at baseline. Patient is now limited by functional impairments (see PT problem list below) and requires min assist for transfers with RW. Patient was able to take small forward steps with RW and was limited by decreased proprioception on Rt LE requiring min assist to prevent LOB. Patient unsafe to progress gait distance or stair training at this time. Patient will benefit from continued skilled PT interventions to address impairments and progress towards PLOF. Acute PT will follow to progress mobility and stair training in preparation for safe discharge home.     Follow Up Recommendations Follow surgeon's recommendation for DC plan and follow-up therapies;Home health PT    Equipment Recommendations  Rolling walker with 5" wheels    Recommendations for Other Services       Precautions / Restrictions Precautions Precautions: Fall Restrictions Weight Bearing Restrictions: No Other Position/Activity Restrictions: WBAT      Mobility  Bed Mobility Overal bed mobility: Needs Assistance Bed Mobility: Supine to Sit;Sit to Supine     Supine to sit: Supervision;HOB elevated Sit to supine: Min guard;HOB elevated   General bed mobility comments: pt taking increased time and using bed rail to sit up EOB. min guard for safety with returning to supine; pt able to raise bil LE's into bed.    Transfers Overall transfer level: Needs assistance Equipment used: Rolling walker (2 wheeled) Transfers: Sit to/from Stand Sit to Stand: Min assist;From elevated surface         General transfer  comment: min assist for safety with initial rise to RW, cues for safe hand placement for transfer. pt marching in place with min guard to raise Rt LE and min assist required to steady with Lt LE marching. cues for safe reach back to sit on EOB.  Ambulation/Gait Ambulation/Gait assistance: Min assist Gait Distance (Feet): 2 Feet Assistive device: Rolling walker (2 wheeled) Gait Pattern/deviations: Step-to pattern;Decreased stride length;Decreased weight shift to right;Scissoring Gait velocity: decr   General Gait Details: min assist required for small forward steps with RW. pt with poor proprioception and scissoring step with Rt LE. pt unsteady and assist for walker position and cues to step back to EOB.  Stairs            Wheelchair Mobility    Modified Rankin (Stroke Patients Only)       Balance                                             Pertinent Vitals/Pain Pain Assessment: 0-10 Pain Score: 5  Pain Location: Rt hip Pain Descriptors / Indicators: Dull;Aching Pain Intervention(s): Limited activity within patient's tolerance;Monitored during session;Repositioned;Ice applied    Home Living Family/patient expects to be discharged to:: Private residence Living Arrangements: Spouse/significant other Available Help at Discharge: Family;Available 24 hours/day Type of Home: House Home Access: Stairs to enter Entrance Stairs-Rails: None Entrance Stairs-Number of Steps: 6 Home Layout: One level Home Equipment: Shower seat - built in;Hand held shower head Additional Comments: pt's wife can  help at home and his daughter lives clsoe by but is a Pharmacist, hospital and works during the day.    Prior Function Level of Independence: Independent               Hand Dominance   Dominant Hand: Right    Extremity/Trunk Assessment   Upper Extremity Assessment Upper Extremity Assessment: Overall WFL for tasks assessed    Lower Extremity Assessment Lower Extremity  Assessment: RLE deficits/detail RLE Deficits / Details: goo dquad activation, 3/5 at best for plantar flexion, 4/5 for dorsiflexion RLE Sensation: decreased light touch (pt reports numbess on plantar surface of bil feet) RLE Coordination: decreased gross motor (hip extensor weakness)    Cervical / Trunk Assessment Cervical / Trunk Assessment: Normal  Communication   Communication: No difficulties  Cognition Arousal/Alertness: Awake/alert Behavior During Therapy: WFL for tasks assessed/performed Overall Cognitive Status: Within Functional Limits for tasks assessed                                        General Comments      Exercises     Assessment/Plan    PT Assessment Patient needs continued PT services  PT Problem List Decreased strength;Decreased range of motion;Decreased activity tolerance;Decreased balance;Decreased mobility;Decreased knowledge of use of DME;Decreased knowledge of precautions;Pain       PT Treatment Interventions DME instruction;Gait training;Stair training;Functional mobility training;Therapeutic activities;Therapeutic exercise;Balance training;Patient/family education    PT Goals (Current goals can be found in the Care Plan section)  Acute Rehab PT Goals Patient Stated Goal: be able to return home PT Goal Formulation: With patient Time For Goal Achievement: 10/20/20 Potential to Achieve Goals: Good    Frequency 7X/week   Barriers to discharge        Co-evaluation               AM-PAC PT "6 Clicks" Mobility  Outcome Measure Help needed turning from your back to your side while in a flat bed without using bedrails?: A Little Help needed moving from lying on your back to sitting on the side of a flat bed without using bedrails?: A Little Help needed moving to and from a bed to a chair (including a wheelchair)?: A Little Help needed standing up from a chair using your arms (e.g., wheelchair or bedside chair)?: A Little Help  needed to walk in hospital room?: A Lot Help needed climbing 3-5 steps with a railing? : A Lot 6 Click Score: 16    End of Session Equipment Utilized During Treatment: Gait belt Activity Tolerance: Patient tolerated treatment well Patient left: in bed;with call bell/phone within reach Nurse Communication: Mobility status PT Visit Diagnosis: Muscle weakness (generalized) (M62.81);Unsteadiness on feet (R26.81);Difficulty in walking, not elsewhere classified (R26.2)    Time: 4765-4650 PT Time Calculation (min) (ACUTE ONLY): 17 min   Charges:   PT Evaluation $PT Eval Low Complexity: 1 Low          Verner Mould, DPT Acute Rehabilitation Services Office [phone] Pager [phone]    Jacques Navy 10/13/2020, 4:41 PM

## 2020-10-13 NOTE — Interval H&P Note (Signed)
History and Physical Interval Note: Patient understands that he is here today for a right total hip arthroplasty to treat the pain from his right hip osteoarthritis.  There is been no acute changes in medical status.  See recent H&P.  The risks and benefits of surgery have been explained in detail and informed consent is obtained.  The right operative hip has been marked.  10/13/2020 8:41 AM  Randy Peterson  has presented today for surgery, with the diagnosis of osteoarthritis right hip.  The various methods of treatment have been discussed with the patient and family. After consideration of risks, benefits and other options for treatment, the patient has consented to  Procedure(s) with comments: [place] (Right) - 3E bed as a surgical intervention.  The patient's history has been reviewed, patient examined, no change in status, stable for surgery.  I have reviewed the patient's chart and labs.  Questions were answered to the patient's satisfaction.     Mcarthur Rossetti

## 2020-10-13 NOTE — Progress Notes (Signed)
Physical Therapy Treatment Patient Details Name: Randy Peterson MRN: [id number] DOB: 1946-06-28 Today's Date: 10/13/2020    History of Present Illness Patient is 75 y.o. male s/p Rt THA anterior approach on 10/13/20 with PMH significant for CAD s/p stent, HLD, MI, OA, anxiety, depression.    PT Comments    Pt able to progress this session with increased ambulation distance with MIN guard progressing to supervision with step through gait pattern and ability to perform safe stair negotiation with MIN guard for safety and verbal cues for sequencing. Pt was able to verbalize safe guarding position for family members when assisting at home. Reviewed LE HEP with no complaints of increased pain or fatigue. Pt is at safe mobility level for return home and will have assistance from his wife at home and his daughter is available intermittently.  Acute therapy to follow up during stay.        Follow Up Recommendations  Follow surgeon's recommendation for DC plan and follow-up therapies;Home health PT     Equipment Recommendations  Rolling walker with 5" wheels    Recommendations for Other Services       Precautions / Restrictions Precautions Precautions: Fall Restrictions Weight Bearing Restrictions: No Other Position/Activity Restrictions: WBAT    Mobility  Bed Mobility Overal bed mobility: Modified Independent Bed Mobility: Supine to Sit;Sit to Supine     Supine to sit: Supervision;HOB elevated Sit to supine: Supervision;HOB elevated   General bed mobility comments: pt able to raise and lower bil LE's into and out of bed and use B UEs to scoot to EOB safely.  Transfers Overall transfer level: Needs assistance Equipment used: Rolling walker (2 wheeled) Transfers: Sit to/from Stand Sit to Stand: From elevated surface;Min guard         General transfer comment: min guard for safety with rise to RW, cues for safe hand placement for transfer. pt marching in place with min guard  for safety to raise B LEs.  Ambulation/Gait Ambulation/Gait assistance: Min guard;Supervision Gait Distance (Feet): 100 Feet Assistive device: Rolling walker (2 wheeled) Gait Pattern/deviations: Decreased weight shift to right;Step-through pattern Gait velocity: fair   General Gait Details: MIN guard progressing to supervision for safety and cues for step through gait pattern, no LOB observed.   Stairs Stairs: Yes Stairs assistance: Min guard Stair Management: Two rails;Forwards Number of Stairs: 3 General stair comments: MIN guard for safety with cues for sequencing "up with good, down with the bad." Pt able to verbalize safe guarding position for family members when assisting at home.   Wheelchair Mobility    Modified Rankin (Stroke Patients Only)       Balance Overall balance assessment: Needs assistance Sitting-balance support: Feet supported Sitting balance-Leahy Scale: Good     Standing balance support: Bilateral upper extremity supported;No upper extremity supported Standing balance-Leahy Scale: Fair Standing balance comment: use of RW, pt able to perform hand hygiene without UE support from RW                            Cognition Arousal/Alertness: Awake/alert Behavior During Therapy: [facility] for tasks assessed/performed Overall Cognitive Status: Within Functional Limits for tasks assessed                                        Exercises Total Joint Exercises Ankle Circles/Pumps: AROM;Both;20 reps;Supine Quad Sets: AROM;Right;5 reps;Supine  Short Arc Quad: AROM;Right;5 reps;Supine    General Comments        Pertinent Vitals/Pain Pain Assessment: 0-10 Pain Score: 3  Pain Location: Rt hip Pain Descriptors / Indicators: Dull;Sore Pain Intervention(s): Limited activity within patient's tolerance;Monitored during session;Repositioned    Home Living Family/patient expects to be discharged to:: Private residence Living  Arrangements: Spouse/significant other Available Help at Discharge: Family;Available 24 hours/day Type of Home: House Home Access: Stairs to enter Entrance Stairs-Rails: None Home Layout: One level Home Equipment: Shower seat - built in;Hand held shower head Additional Comments: pt's wife can help at home and his daughter lives clsoe by but is a Pharmacist, hospital and works during the day.    Prior Function Level of Independence: Independent          PT Goals (current goals can now be found in the care plan section) Acute Rehab PT Goals Patient Stated Goal: be able to return home PT Goal Formulation: With patient Time For Goal Achievement: 10/20/20 Potential to Achieve Goals: Good Progress towards PT goals: Progressing toward goals    Frequency    7X/week      PT Plan Current plan remains appropriate    Co-evaluation              AM-PAC PT "6 Clicks" Mobility   Outcome Measure  Help needed turning from your back to your side while in a flat bed without using bedrails?: None Help needed moving from lying on your back to sitting on the side of a flat bed without using bedrails?: None Help needed moving to and from a bed to a chair (including a wheelchair)?: A Little Help needed standing up from a chair using your arms (e.g., wheelchair or bedside chair)?: A Little Help needed to walk in hospital room?: A Little Help needed climbing 3-5 steps with a railing? : A Little 6 Click Score: 20    End of Session Equipment Utilized During Treatment: Gait belt Activity Tolerance: Patient tolerated treatment well Patient left: in bed;with call bell/phone within reach Nurse Communication: Mobility status PT Visit Diagnosis: Muscle weakness (generalized) (M62.81);Unsteadiness on feet (R26.81);Difficulty in walking, not elsewhere classified (R26.2)     Time: 6767-2094 PT Time Calculation (min) (ACUTE ONLY): 19 min  Charges:                        Elna Breslow, SPT  Acute  rehab     Elna Breslow 10/13/2020, 6:34 PM

## 2020-10-13 NOTE — Anesthesia Postprocedure Evaluation (Signed)
Anesthesia Post Note  Patient: Randy Peterson  Procedure(s) Performed: RIGHT TOTAL HIP ARTHROPLASTY ANTERIOR APPROACH (Right Hip)     Patient location during evaluation: PACU Anesthesia Type: Spinal Level of consciousness: awake and alert and oriented Pain management: pain level controlled Vital Signs Assessment: post-procedure vital signs reviewed and stable Respiratory status: spontaneous breathing, nonlabored ventilation and respiratory function stable Cardiovascular status: blood pressure returned to baseline Postop Assessment: no apparent nausea or vomiting, spinal receding, no headache and no backache Anesthetic complications: no   No complications documented.  Last Vitals:  Vitals:   10/13/20 1200 10/13/20 1215  BP: (!) 150/71 140/74  Pulse: (!) 57 (!) 59  Resp: 19 (!) 30  Temp:    SpO2: 100% 100%    Last Pain:  Vitals:   10/13/20 1215  TempSrc:   PainSc: 0-No pain                 Brennan Bailey

## 2020-10-16 ENCOUNTER — Encounter (HOSPITAL_COMMUNITY): Payer: Self-pay | Admitting: Orthopaedic Surgery

## 2020-10-17 ENCOUNTER — Telehealth: Payer: Self-pay | Admitting: Orthopaedic Surgery

## 2020-10-17 ENCOUNTER — Other Ambulatory Visit: Payer: Self-pay

## 2020-10-17 ENCOUNTER — Encounter (HOSPITAL_COMMUNITY): Payer: Self-pay | Admitting: Psychiatry

## 2020-10-17 ENCOUNTER — Telehealth (INDEPENDENT_AMBULATORY_CARE_PROVIDER_SITE_OTHER): Payer: Medicare Other | Admitting: Psychiatry

## 2020-10-17 DIAGNOSIS — F419 Anxiety disorder, unspecified: Secondary | ICD-10-CM

## 2020-10-17 DIAGNOSIS — F330 Major depressive disorder, recurrent, mild: Secondary | ICD-10-CM

## 2020-10-17 MED ORDER — BUPROPION HCL ER (XL) 150 MG PO TB24
150.0000 mg | ORAL_TABLET | Freq: Every day | ORAL | 0 refills | Status: DC
Start: 2020-10-17 — End: 2021-01-10

## 2020-10-17 MED ORDER — DULOXETINE HCL 20 MG PO CPEP
20.0000 mg | ORAL_CAPSULE | Freq: Every day | ORAL | 0 refills | Status: DC
Start: 2020-10-17 — End: 2021-01-10

## 2020-10-17 NOTE — Progress Notes (Signed)
Virtual Visit via Telephone Note  I connected with Randy Peterson on 10/17/20 at 11:20 AM EST by telephone and verified that I am speaking with the correct person using two identifiers.  Location: Patient: Home Provider: Home Office   I discussed the limitations, risks, security and privacy concerns of performing an evaluation and management service by telephone and the availability of in person appointments. I also discussed with the patient that there may be a patient responsible charge related to this service. The patient expressed understanding and agreed to proceed.   History of Present Illness: Patient is evaluated by phone session.  Last week he had right hip replacement and he is still in a lot of pain.  He is taking muscle relaxant and narcotic pain medication.  He is sleeping okay with the help of pain medicine.  He started walking every 1 hour but also have home health aide who comes to help him.  He had a good support from his wife.  Patient feels his depression and anxiety is a stable.  He denies any crying spells or any feeling of hopelessness.  His energy level is okay.  He is hoping once his pain is subsided then he can start regular walking.  He lives with his wife and 2 dogs.  He had a good holidays.  He denies any concerns or any side effects from his antidepressant.  He like to continue his current medication.    Past Psychiatric History: H/Oinpatient in 2089forsuicidal attemptandcut wrist.Depressedbecause lostjob.TookPaxil, Risperdal and given Vyvanseforattention and focus.Cymbaltaand Wellbutrinhelped.  Recent Results (from the past 2160 hour(s))  Surgical pcr screen     Status: None   Collection Time: 10/05/20 12:46 PM   Specimen: Nasal Mucosa; Nasal Swab  Result Value Ref Range   MRSA, PCR NEGATIVE NEGATIVE   Staphylococcus aureus NEGATIVE NEGATIVE    Comment: (NOTE) The Xpert SA Assay (FDA approved for NASAL specimens in patients 55 years of age  and older), is one component of a comprehensive surveillance program. It is not intended to diagnose infection nor to guide or monitor treatment. Performed at [facility], [place] [address] [place], [place] [id number]   Basic metabolic panel per protocol     Status: None   Collection Time: 10/05/20  1:30 PM  Result Value Ref Range   Sodium 139 135 - 145 mmol/L   Potassium 5.0 3.5 - 5.1 mmol/L   Chloride 105 98 - 111 mmol/L   CO2 24 22 - 32 mmol/L   Glucose, Bld 93 70 - 99 mg/dL    Comment: Glucose reference range applies only to samples taken after fasting for at least 8 hours.   BUN 20 8 - 23 mg/dL   Creatinine, Ser 1.17 0.61 - 1.24 mg/dL   Calcium 9.5 8.9 - 10.3 mg/dL   GFR, Estimated >60 >60 mL/min    Comment: (NOTE) Calculated using the CKD-EPI Creatinine Equation (2021)    Anion gap 10 5 - 15    Comment: Performed at [facility], [place] [address] [place], [place] [address]  CBC per protocol     Status: None   Collection Time: 10/05/20  1:30 PM  Result Value Ref Range   WBC 7.0 4.0 - 10.5 K/uL   RBC 4.75 4.22 - 5.81 MIL/uL   Hemoglobin 14.7 13.0 - 17.0 g/dL   HCT 44.2 39.0 - 52.0 %   MCV 93.1 80.0 - 100.0 fL   MCH 30.9 26.0 - 34.0 pg  MCHC 33.3 30.0 - 36.0 g/dL   RDW 12.9 11.5 - 15.5 %   Platelets 209 150 - 400 K/uL   nRBC 0.0 0.0 - 0.2 %    Comment: Performed at [facility], [place] [address] [place], [place] [address]  Type and screen Order type and screen if day of surgery is less than 15 days from draw of preadmission visit or order morning of surgery if day of surgery is greater than 6 days from preadmission visit.     Status: None   Collection Time: 10/05/20  2:15 PM  Result Value Ref Range   ABO/RH(D) A POS    Antibody Screen NEG    Sample Expiration 10/16/2020,2359    Extend sample reason      NO TRANSFUSIONS OR PREGNANCY IN THE PAST 3 MONTHS Performed at [facility], [place] [address], [place], [place] [address]   SARS CORONAVIRUS 2 (TAT 6-24 HRS) Nasopharyngeal Nasopharyngeal Swab     Status: None   Collection Time: 10/11/20  2:28 PM   Specimen: Nasopharyngeal Swab  Result Value Ref Range   SARS Coronavirus 2 NEGATIVE NEGATIVE    Comment: (NOTE) SARS-CoV-2 target nucleic acids are NOT DETECTED.  The SARS-CoV-2 RNA is generally detectable in upper and lower respiratory specimens during the acute phase of infection. Negative results do not preclude SARS-CoV-2 infection, do not rule out co-infections with other pathogens, and should not be used as the sole basis for treatment or other patient management decisions. Negative results must be combined with clinical observations, patient history, and epidemiological information. The expected result is Negative.  Fact Sheet for Patients: SugarRoll.be  Fact Sheet for Healthcare Providers: https://www.woods-mathews.com/  This test is not yet approved or cleared by the [place] FDA and  has been authorized for detection and/or diagnosis of SARS-CoV-2 by FDA under an Emergency Use Authorization (EUA). This EUA will remain  in effect (meaning this test can be used) for the duration of the COVID-19 declaration under Se ction 564(b)(1) of the Act, 21 U.S.C. section 360bbb-3(b)(1), unless the authorization is terminated or revoked sooner.  Performed at [facility], [place] [address] [place], [place] [address]   ABO/Rh     Status: None   Collection Time: 10/13/20  7:00 AM  Result Value Ref Range   ABO/RH(D)      A POS Performed at [facility], [place] [address] [place], [place] [address]      Psychiatric Specialty Exam: Physical Exam  Review of Systems  Weight 205 lb (93 kg).There is no height or weight on file to calculate BMI.  General Appearance: NA  Eye Contact:  NA  Speech:  Normal Rate  Volume:  Normal  Mood:  Euthymic  Affect:  NA   Thought Process:  Goal Directed  Orientation:  Full (Time, Place, and Person)  Thought Content:  WDL  Suicidal Thoughts:  No  Homicidal Thoughts:  No  Memory:  Immediate;   Good Recent;   Good Remote;   Good  Judgement:  Good  Insight:  Present  Psychomotor Activity:  NA  Concentration:  Concentration: Good and Attention Span: Good  Recall:  Good  Fund of Knowledge:  Good  Language:  Good  Akathisia:  No  Handed:  Right  AIMS (if indicated):     Assets:  Communication Skills Desire for Improvement Housing Resilience Social Support  ADL's:  Intact  Cognition:  WNL  Sleep:   ok with  pain medication      Assessment and Plan: Major depressive disorder, recurrent.  Anxiety.  I reviewed blood work results.  His basic chemistry and CBC is normal.  He like to keep his current medication.  We discussed medication side effects.  Patient is no longer taking Xanax for more than 6 months and I recommend while he is taking narcotic medication and muscle relaxant he should avoid taking Xanax.  He agree.  We will continue Wellbutrin XL 150 mg daily and Cymbalta 20 mg daily.  Recommended to call us back if is any question or any concern.  Follow-up in 3 months.  Follow Up Instructions:    I discussed the assessment and treatment plan with the patient. The patient was provided an opportunity to ask questions and all were answered. The patient agreed with the plan and demonstrated an understanding of the instructions.   The patient was advised to call back or seek an in-person evaluation if the symptoms worsen or if the condition fails to improve as anticipated.  I provided 12 minutes of non-face-to-face time during this encounter.   Kathlee Nations, MD

## 2020-10-17 NOTE — Telephone Encounter (Signed)
[employer] Rep called with approval for following medications. Ondanfetrln has been approved. If any further questions please call provider line [phone] option #5.

## 2020-10-26 ENCOUNTER — Ambulatory Visit (INDEPENDENT_AMBULATORY_CARE_PROVIDER_SITE_OTHER): Payer: Medicare Other | Admitting: Orthopaedic Surgery

## 2020-10-26 ENCOUNTER — Encounter: Payer: Self-pay | Admitting: Orthopaedic Surgery

## 2020-10-26 DIAGNOSIS — Z96641 Presence of right artificial hip joint: Secondary | ICD-10-CM

## 2020-10-26 NOTE — Progress Notes (Signed)
The patient comes in today 2 weeks tomorrow status post a right total hip arthroplasty.  He is doing well.  He does have some knee pain and foot and ankle swelling.  He denies any calf pain.  He has been taking a baby aspirin twice a day.  He also has been mobilizing well.  He is just using a cane.  He is not taking any pain medication.  Examination of his right hip incision shows the staples been removed and Steri-Strips applied.  Overall it looks really good.  His ligaments are equal.  His calf is soft.  His foot and ankle are swollen.  He does go down some at night.  He will stay on a baby aspirin twice a day for 1 more week and then go down to just his regular once a day baby aspirin.  I would like to see him back in 2 weeks just to make sure that the swelling is going down on that leg.  He will continue to increase his activities as comfort allows.

## 2020-11-09 ENCOUNTER — Encounter: Payer: Self-pay | Admitting: Orthopaedic Surgery

## 2020-11-09 ENCOUNTER — Ambulatory Visit (HOSPITAL_COMMUNITY)
Admission: RE | Admit: 2020-11-09 | Discharge: 2020-11-09 | Disposition: A | Discharge status: Home / Self Care | Payer: Medicare Other | Source: Ambulatory Visit | Attending: Orthopaedic Surgery | Admitting: Orthopaedic Surgery

## 2020-11-09 ENCOUNTER — Other Ambulatory Visit: Payer: Self-pay

## 2020-11-09 ENCOUNTER — Ambulatory Visit (INDEPENDENT_AMBULATORY_CARE_PROVIDER_SITE_OTHER): Payer: Medicare Other | Admitting: Orthopaedic Surgery

## 2020-11-09 DIAGNOSIS — M79604 Pain in right leg: Secondary | ICD-10-CM

## 2020-11-09 DIAGNOSIS — M7989 Other specified soft tissue disorders: Secondary | ICD-10-CM | POA: Diagnosis not present

## 2020-11-09 DIAGNOSIS — Z96641 Presence of right artificial hip joint: Secondary | ICD-10-CM

## 2020-11-09 NOTE — Progress Notes (Signed)
Lower extremity venous has been completed.   Preliminary results in CV Proc.   Randy Peterson 11/09/2020 3:48 PM

## 2020-11-09 NOTE — Progress Notes (Signed)
The patient is now almost 4-week status post a right total hip arthroplasty.  Right hip is doing well but is having some knee pain as well as still having swelling in his calf foot and ankle on the right side.  He has been on aspirin and is been wearing TED hose.  On exam his hip is moving smoothly on the right side.  His calf is not firm but is swollen his foot and ankle are swollen as well.  I can palpate a pulse in his foot.  I would like to send him for a Doppler ultrasound of the right lower extremity rule out DVT.  He will continue his aspirin as well.  From my standpoint I will need to see him back for 3 weeks for repeat exam but if there is a DVT we will treat it medically accordingly and will call in a prescription for Eliquis but only if there is a DVT.  All question concerns were answered and addressed.  He will continue elevation but I will have him stop wearing the TED hose.

## 2020-12-04 ENCOUNTER — Encounter: Payer: Self-pay | Admitting: Orthopaedic Surgery

## 2020-12-04 ENCOUNTER — Ambulatory Visit (INDEPENDENT_AMBULATORY_CARE_PROVIDER_SITE_OTHER): Payer: Medicare Other | Admitting: Orthopaedic Surgery

## 2020-12-04 DIAGNOSIS — Z96641 Presence of right artificial hip joint: Secondary | ICD-10-CM

## 2020-12-04 NOTE — Progress Notes (Signed)
The patient is now 7 weeks status post a right total hip arthroplasty.  He was still having another foot and ankle swelling that a Doppler was obtained on February 24 of the right lower extremity was negative for DVT.  He says he still has some difficulty when he goes from a sitting to a standing position with some pain in the groin but he walks it off easily.  He feels like he is making good progress.  His right operative hip does move smoothly and fluidly.  His calf is soft but his foot and ankle still have some swelling but this not as much as it was.  He will continue to increase activities as comfort allows.  From my standpoint I will need to see him back for 6 months unless there is issues.  At that visit I like a standing low AP pelvis and lateral of his right operative hip.  All questions and concerns were answered and addressed.

## 2020-12-28 ENCOUNTER — Other Ambulatory Visit: Payer: Self-pay

## 2020-12-28 ENCOUNTER — Ambulatory Visit: Payer: Medicare Other | Attending: Internal Medicine

## 2020-12-28 DIAGNOSIS — Z23 Encounter for immunization: Secondary | ICD-10-CM

## 2020-12-28 NOTE — Progress Notes (Signed)
   Covid-19 Vaccination Clinic  Name:  Alhaji Mcneal    MRN: [id number] DOB: 1946/04/29  12/28/2020  Mr. Suddreth was observed post Covid-19 immunization for 15 minutes without incident. He was provided with Vaccine Information Sheet and instruction to access the V-Safe system.   Mr. Penick was instructed to call 911 with any severe reactions post vaccine: Marland Kitchen Difficulty breathing  . Swelling of face and throat  . A fast heartbeat  . A bad rash all over body  . Dizziness and weakness   Immunizations Administered    Name Date Dose VIS Date Route   PFIZER Comrnaty(Gray TOP) Covid-19 Vaccine 12/28/2020  2:35 PM 0.3 mL 08/24/2020 Intramuscular   Manufacturer: [employer]   Lot: [id number]   NDC: [phone]

## 2021-01-01 ENCOUNTER — Other Ambulatory Visit (HOSPITAL_BASED_OUTPATIENT_CLINIC_OR_DEPARTMENT_OTHER): Payer: Self-pay

## 2021-01-01 MED ORDER — COVID-19 MRNA VACCINE (PFIZER) 30 MCG/0.3ML IM SUSP
INTRAMUSCULAR | 0 refills | Status: DC
  Filled 2021-01-01: qty 0.3, 1d supply, fill #0

## 2021-01-10 ENCOUNTER — Telehealth (INDEPENDENT_AMBULATORY_CARE_PROVIDER_SITE_OTHER): Payer: Medicare Other | Admitting: Psychiatry

## 2021-01-10 ENCOUNTER — Encounter (HOSPITAL_COMMUNITY): Payer: Self-pay | Admitting: Psychiatry

## 2021-01-10 ENCOUNTER — Other Ambulatory Visit: Payer: Self-pay

## 2021-01-10 DIAGNOSIS — F419 Anxiety disorder, unspecified: Secondary | ICD-10-CM

## 2021-01-10 DIAGNOSIS — F330 Major depressive disorder, recurrent, mild: Secondary | ICD-10-CM | POA: Diagnosis not present

## 2021-01-10 MED ORDER — DULOXETINE HCL 20 MG PO CPEP: 20.0000 mg | ORAL_CAPSULE | Freq: Every day | ORAL | 0 refills | Status: DC

## 2021-01-10 MED ORDER — BUPROPION HCL ER (XL) 150 MG PO TB24: 150.0000 mg | ORAL_TABLET | Freq: Every day | ORAL | 0 refills | Status: DC

## 2021-01-10 NOTE — Progress Notes (Signed)
Virtual Visit via Telephone Note  I connected with Randy Peterson on 01/10/21 at 10:00 AM EDT by telephone and verified that I am speaking with the correct person using two identifiers.  Location: Patient: Home Provider: Home Office   I discussed the limitations, risks, security and privacy concerns of performing an evaluation and management service by telephone and the availability of in person appointments. I also discussed with the patient that there may be a patient responsible charge related to this service. The patient expressed understanding and agreed to proceed.   History of Present Illness: Patient is evaluated by phone session.  He is taking Cymbalta and Wellbutrin.  He is doing much better.  He is off from pain medicine and muscle relaxant as he is healing from his hip surgery.  He started driving and does not feel he needed any pain medicine.  He is sleeping good without any sleep aid.  He denies any panic attack, crying spells or any feeling of hopelessness or worthlessness.  His energy level is good.  He lives with his wife and 2 dogs.  Patient has no tremors, shakes or any EPS.  He denies any paranoia or any suicidal thoughts.  He like to keep his current medication.  He has not taken Xanax in more than 6 months.  Past Psychiatric History: H/Oinpatient in 2038forsuicidal attemptandcut wrist.Depressedbecause lostjob.TookPaxil, Risperdal and given Vyvanseforattention and focus.Cymbaltaand Wellbutrinhelped.   Psychiatric Specialty Exam: Physical Exam  Review of Systems  Weight 204 lb (92.5 kg).There is no height or weight on file to calculate BMI.  General Appearance: NA  Eye Contact:  NA  Speech:  Clear and Coherent and Normal Rate  Volume:  Normal  Mood:  Euthymic  Affect:  NA  Thought Process:  Goal Directed  Orientation:  Full (Time, Place, and Person)  Thought Content:  WDL  Suicidal Thoughts:  No  Homicidal Thoughts:  No  Memory:  Immediate;    Good Recent;   Good Remote;   Good  Judgement:  Good  Insight:  Present  Psychomotor Activity:  NA  Concentration:  Concentration: Good and Attention Span: Good  Recall:  Good  Fund of Knowledge:  Good  Language:  Good  Akathisia:  No  Handed:  Right  AIMS (if indicated):     Assets:  Communication Skills Desire for Improvement Housing Resilience Social Support Transportation  ADL's:  Intact  Cognition:  WNL  Sleep:   ok      Assessment and Plan: Major depressive disorder, recurrent.  Anxiety.  Patient doing better on the combination of Wellbutrin and Cymbalta.  He is not taking Xanax for more than 9 months.  He is not taking pain medicine and muscle relaxant as pain is no longer.  Continue Wellbutrin XL 150 mg daily and Cymbalta 20 mg daily.  Recommended to call us back if he has any questions or any concern.  Follow-up in 3 months.  Follow Up Instructions:    I discussed the assessment and treatment plan with the patient. The patient was provided an opportunity to ask questions and all were answered. The patient agreed with the plan and demonstrated an understanding of the instructions.   The patient was advised to call back or seek an in-person evaluation if the symptoms worsen or if the condition fails to improve as anticipated.  I provided 14 minutes of non-face-to-face time during this encounter.   Kathlee Nations, MD

## 2021-01-15 ENCOUNTER — Telehealth (HOSPITAL_COMMUNITY): Payer: Medicare Other | Admitting: Psychiatry

## 2021-04-11 ENCOUNTER — Telehealth (INDEPENDENT_AMBULATORY_CARE_PROVIDER_SITE_OTHER): Payer: Medicare Other | Admitting: Psychiatry

## 2021-04-11 ENCOUNTER — Encounter (HOSPITAL_COMMUNITY): Payer: Self-pay | Admitting: Psychiatry

## 2021-04-11 ENCOUNTER — Other Ambulatory Visit: Payer: Self-pay

## 2021-04-11 DIAGNOSIS — F419 Anxiety disorder, unspecified: Secondary | ICD-10-CM

## 2021-04-11 DIAGNOSIS — F330 Major depressive disorder, recurrent, mild: Secondary | ICD-10-CM | POA: Diagnosis not present

## 2021-04-11 MED ORDER — DULOXETINE HCL 20 MG PO CPEP: 20.0000 mg | ORAL_CAPSULE | Freq: Every day | ORAL | 1 refills | Status: DC

## 2021-04-11 MED ORDER — BUPROPION HCL ER (XL) 150 MG PO TB24
150.0000 mg | ORAL_TABLET | Freq: Every day | ORAL | 1 refills | Status: DC
Start: 2021-04-11 — End: 2021-11-09

## 2021-04-11 NOTE — Progress Notes (Signed)
Virtual Visit via Telephone Note  I connected with Randy Peterson on 04/11/21 at 10:00 AM EDT by telephone and verified that I am speaking with the correct person using two identifiers.  Location: Patient: Home Provider: Home Office   I discussed the limitations, risks, security and privacy concerns of performing an evaluation and management service by telephone and the availability of in person appointments. I also discussed with the patient that there may be a patient responsible charge related to this service. The patient expressed understanding and agreed to proceed.   History of Present Illness: Patient is evaluated by phone session.  He reported medicine is helping and his symptoms are manageable.  He denies any major panic attack and has not taken Xanax in more than 9 months.  He had a trip to [place] which he enjoyed.  He fully recovered from hip surgery which was done in January.  Denies any crying spells or any feeling of hopelessness or worthlessness.  He lives with his wife and 2 dogs.  He has no tremor or shakes or any EPS.  Denies any anhedonia, paranoia or any hallucination.  His energy level is good.  He tried to walk sometimes when brother is not hot but he also enjoyed walking on the beach.  He has a physical coming up in November by Dr. Sheffield Slider.  Past Psychiatric History:  H/O inpatient in 2008 for suicidal attempt and cut wrist. Depressed because lost job. Took Paxil, Risperdal and given Vyvanse for attention and focus. Cymbalta and Wellbutrin helped.       Psychiatric Specialty Exam: Physical Exam  Review of Systems  Weight 200 lb (90.7 kg).There is no height or weight on file to calculate BMI.  General Appearance: NA  Eye Contact:  NA  Speech:  Clear and Coherent and Normal Rate  Volume:  Normal  Mood:  Euthymic  Affect:  NA  Thought Process:  Goal Directed  Orientation:  Full (Time, Place, and Person)  Thought Content:  WDL  Suicidal Thoughts:  No  Homicidal  Thoughts:  No  Memory:  Immediate;   Good Recent;   Good Remote;   Good  Judgement:  Good  Insight:  Present  Psychomotor Activity:  NA  Concentration:  Concentration: Good and Attention Span: Good  Recall:  Good  Fund of Knowledge:  Good  Language:  Good  Akathisia:  No  Handed:  Right  AIMS (if indicated):     Assets:  Communication Skills Desire for Improvement Housing Resilience Social Support Transportation  ADL's:  Intact  Cognition:  WNL  Sleep:   ok      Assessment and Plan: Major depressive disorder, recurrent.  Anxiety.  Patient is stable on his current medication.  He has no tremor or shakes or any EPS.  Continue Wellbutrin XL 150 mg in the morning and Cymbalta 20 mg daily.  Patient is scheduled to have physical and blood work coming up in November by his PCP Dr. Sheffield Slider.  Recommended to call us back if is any question or any concern.  Follow-up in 6 months.  Follow Up Instructions:    I discussed the assessment and treatment plan with the patient. The patient was provided an opportunity to ask questions and all were answered. The patient agreed with the plan and demonstrated an understanding of the instructions.   The patient was advised to call back or seek an in-person evaluation if the symptoms worsen or if the condition fails to improve as anticipated.  I provided 14 minutes of non-face-to-face time during this encounter.   Kathlee Nations, MD

## 2021-04-23 NOTE — Progress Notes (Signed)
Cardiology Office Note   Date:  04/24/2021   ID:  Randy Peterson, DOB [birth year], MRN [id number]  PCP:  Derinda Late, MD  Cardiologist:   Minus Breeding, MD    Chief Complaint  Patient presents with   Coronary Artery Disease      History of Present Illness: Randy Peterson is a 75 y.o. male who presents for follow-up of inferior myocardial infarction. 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 Perfusion study in Oct 2019 was negative for ischemia.  He returns for follow up.    Since I last saw him he had a lumbar laminectomy.  Also he had hip replacement.  Since then he has done well.  He walks at the beach sometimes but probably does not exercise as much as I would like.  With his level of activity he denies any cardiovascular symptoms. The patient denies any new symptoms such as chest discomfort, neck or arm discomfort. There has been no new shortness of breath, PND or orthopnea. There have been no reported palpitations, presyncope or syncope.    Past Medical History:  Diagnosis Date   Acid reflux    Acute MI, inferior wall, initial episode of care ([place]) 05/17/15   Angina decubitus ([place]) 05/16/15   Anxiety    Arthritis    both shoulders and back   CAD in native artery    per cath 05/16/14   Depression    Hyperlipidemia LDL goal <70    Prostate enlargement    S/P coronary artery stent placement, DES promus premier, RCA 05/17/15 05/18/2015    Past Surgical History:  Procedure Laterality Date   CARDIAC CATHETERIZATION N/A 05/17/2015   Procedure: Left Heart Cath and Coronary Angiography;  Surgeon: Sherren Mocha, MD;  Location: [facility];  Service: Cardiovascular;  Laterality: N/A;   CARDIAC CATHETERIZATION N/A 05/17/2015   Procedure: Coronary Stent Intervention;  Surgeon: Sherren Mocha, MD;  Location: [facility];  Service: Cardiovascular;  Laterality: N/A;   LUMBAR LAMINECTOMY/DECOMPRESSION MICRODISCECTOMY Bilateral 01/24/2020    Procedure: Laminectomy and Foraminotomy - bilateral - Lumbar Three-Four, Lumbar Four-Five.;  Surgeon: Kary Kos, MD;  Location: [facility];  Service: Neurosurgery;  Laterality: Bilateral;  posterior   None     TOTAL HIP ARTHROPLASTY Right 10/13/2020   Procedure: RIGHT TOTAL HIP ARTHROPLASTY ANTERIOR APPROACH;  Surgeon: Mcarthur Rossetti, MD;  [facility]: [facility];  Service: Orthopedics;  Laterality: Right;  3E bed     Current Outpatient Medications  Medication Sig Dispense Refill   acetaminophen (TYLENOL) 325 MG tablet Take 2 tablets (650 mg total) by mouth every 4 (four) hours as needed for headache or mild pain.     ALPRAZolam (XANAX) 0.5 MG tablet Take one tab as needed for severe anxiety 15 tablet 0   aspirin (ASPIRIN 81) 81 MG chewable tablet Chew 1 tablet (81 mg total) by mouth 2 (two) times daily at 8 am and 10 pm. 30 tablet 0   atorvastatin (LIPITOR) 80 MG tablet Take 1 tablet (80 mg total) by mouth daily at 6 PM. 30 tablet 6   buPROPion (WELLBUTRIN XL) 150 MG 24 hr tablet Take 1 tablet (150 mg total) by mouth daily. 90 tablet 1   cholecalciferol (VITAMIN D) 1000 UNITS tablet Take 1,000 Units by mouth daily.     COVID-19 mRNA vaccine, Pfizer, 30 MCG/0.3ML injection Inject into the muscle. 0.3 mL 0   DULoxetine (CYMBALTA) 20 MG capsule Take 1 capsule (20 mg  total) by mouth daily. 90 capsule 1   esomeprazole (NEXIUM) 40 MG capsule Take 40 mg by mouth 2 (two) times daily before a meal.     ezetimibe (ZETIA) 10 MG tablet Take 10 mg by mouth every evening.     finasteride (PROSCAR) 5 MG tablet Take 5 mg by mouth every evening.      metoprolol tartrate (LOPRESSOR) 25 MG tablet Take 1 tablet (25 mg total) by mouth 2 (two) times daily. 180 tablet 1   tamsulosin (FLOMAX) 0.4 MG CAPS capsule Take 0.4 mg by mouth every evening.      XIIDRA 5 % SOLN Place 1 drop into both eyes daily.     nitroGLYCERIN (NITROSTAT) 0.4 MG SL tablet Place 1 tablet (0.4 mg total) under the tongue every 5 (five)  minutes as needed for chest pain. 25 tablet 1   No current facility-administered medications for this visit.    Allergies:   Patient has no known allergies.    ROS:  As stated in the HPI and negative for all other systems.   PHYSICAL EXAM: VS:  BP 110/60   Pulse 68   Ht '5\' 6"'$  (1.676 m)   Wt 204 lb 12.8 oz (92.9 kg)   BMI 33.06 kg/m  , BMI Body mass index is 33.06 kg/m.  GENERAL:  Well appearing NECK:  No jugular venous distention, waveform within normal limits, carotid upstroke brisk and symmetric, no bruits, no thyromegaly LUNGS:  Clear to auscultation bilaterally CHEST:  Unremarkable HEART:  PMI not displaced or sustained,S1 and S2 within normal limits, no S3, no S4, no clicks, no rubs, no murmurs ABD:  Flat, positive bowel sounds normal in frequency in pitch, no bruits, no rebound, no guarding, no midline pulsatile mass, no hepatomegaly, no splenomegaly EXT:  2 plus pulses throughout, mild bilateral leg edema, no cyanosis no clubbing   EKG:  EKG is  ordered today. Sinus rhythm, rate 68, axis within normal limits, intervals within normal limits, no acute ST-T wave changes.  Recent Labs: 10/05/2020: BUN 20; Creatinine, Ser 1.17; Hemoglobin 14.7; Platelets 209; Potassium 5.0; Sodium 139     Wt Readings from Last 3 Encounters:  04/24/21 204 lb 12.8 oz (92.9 kg)  10/13/20 205 lb 6.4 oz (93.2 kg)  10/05/20 205 lb 6.4 oz (93.2 kg)      Other studies Reviewed: Additional studies/ records that were reviewed today include:  None Review of the above records demonstrates:   NA    ASSESSMENT AND PLAN:  CAD:  The patient has no new sypmtoms.  No further cardiovascular testing is indicated.  We will continue with aggressive risk reduction and meds as listed.  I did talk to him at length about goals of diet and exercise.   DYSLIPIDEMIA:    I do not have his most recent labs but will defer to Derinda Late, MD  Current medicines are reviewed at length with the patient  today.  The patient does not have concerns regarding medicines.  The following changes have been made:   None  Labs/ tests ordered today include:   None  Orders Placed This Encounter  Procedures   EKG 12-Lead      Disposition:   FU with 12 months.      Signed, Minus Breeding, MD  04/24/2021 5:33 PM    [place]

## 2021-04-24 ENCOUNTER — Other Ambulatory Visit: Payer: Self-pay

## 2021-04-24 ENCOUNTER — Ambulatory Visit: Payer: Medicare Other | Admitting: Cardiology

## 2021-04-24 ENCOUNTER — Encounter: Payer: Self-pay | Admitting: Cardiology

## 2021-04-24 VITALS — BP 110/60 | HR 68 | Ht 66.0 in | Wt 204.8 lb

## 2021-04-24 DIAGNOSIS — I2510 Atherosclerotic heart disease of native coronary artery without angina pectoris: Secondary | ICD-10-CM

## 2021-04-24 DIAGNOSIS — E785 Hyperlipidemia, unspecified: Secondary | ICD-10-CM

## 2021-04-24 MED ORDER — NITROGLYCERIN 0.4 MG SL SUBL: 0.4000 mg | SUBLINGUAL_TABLET | SUBLINGUAL | 1 refills | Status: AC | PRN

## 2021-04-24 NOTE — Patient Instructions (Signed)
Medication Instructions:  Continue current medications  *If you need a refill on your cardiac medications before your next appointment, please call your pharmacy*   Lab Work: None Ordered   Testing/Procedures: None Ordered   Follow-Up: At [facility], you and your health needs are our priority.  As part of our continuing mission to provide you with exceptional heart care, we have created designated Provider Care Teams.  These Care Teams include your primary Cardiologist (physician) and Advanced Practice Providers (APPs -  Physician Assistants and Nurse Practitioners) who all work together to provide you with the care you need, when you need it.  We recommend signing up for the patient portal called "MyChart".  Sign up information is provided on this After Visit Summary.  MyChart is used to connect with patients for Virtual Visits (Telemedicine).  Patients are able to view lab/test results, encounter notes, upcoming appointments, etc.  Non-urgent messages can be sent to your provider as well.   To learn more about what you can do with MyChart, go to [email or web address].    Your next appointment:   1 year(s)  The format for your next appointment:   In Person  Provider:   You may see Minus Breeding, MD or one of the following Advanced Practice Providers on your designated Care Team:   Rosaria Ferries, PA-C Caron Presume, PA-C Jory Sims, DNP, ANP

## 2021-06-06 ENCOUNTER — Ambulatory Visit: Payer: Medicare Other | Admitting: Orthopaedic Surgery

## 2021-06-11 ENCOUNTER — Ambulatory Visit: Payer: Self-pay

## 2021-06-11 ENCOUNTER — Other Ambulatory Visit: Payer: Self-pay

## 2021-06-11 ENCOUNTER — Ambulatory Visit (INDEPENDENT_AMBULATORY_CARE_PROVIDER_SITE_OTHER): Payer: Medicare Other | Admitting: Orthopaedic Surgery

## 2021-06-11 ENCOUNTER — Encounter: Payer: Self-pay | Admitting: Orthopaedic Surgery

## 2021-06-11 DIAGNOSIS — Z96641 Presence of right artificial hip joint: Secondary | ICD-10-CM

## 2021-06-11 NOTE — Progress Notes (Signed)
The patient is now 8 months status post a right total hip arthroplasty.  He is an active 75 year old gentleman.  He does report occasional groin pain on that right side and some numbness but overall says he is doing great with no issues.  He does walk up some steep stairs which can sometimes be difficult.  He denies any right hip issues.  On exam both hips move smoothly and fluidly.  His leg lengths are equal.  A standing low AP pelvis and lateral of the right hip shows a well-seated implant with no complicating features.  The left hip still appears normal.  At this point follow-up can be as needed.  We had a long and thorough discussion about his hip and talked about the things that we need to bring him back.  All questions and concerns were answered and addressed.

## 2021-09-18 ENCOUNTER — Ambulatory Visit: Payer: Medicare Other | Attending: Internal Medicine

## 2021-09-18 ENCOUNTER — Other Ambulatory Visit (HOSPITAL_BASED_OUTPATIENT_CLINIC_OR_DEPARTMENT_OTHER): Payer: Self-pay

## 2021-09-18 DIAGNOSIS — Z23 Encounter for immunization: Secondary | ICD-10-CM

## 2021-09-18 MED ORDER — PFIZER COVID-19 VAC BIVALENT 30 MCG/0.3ML IM SUSP
INTRAMUSCULAR | 0 refills | Status: DC
  Filled 2021-09-18: qty 0.3, 1d supply, fill #0

## 2021-09-18 NOTE — Progress Notes (Signed)
° °  Covid-19 Vaccination Clinic  Name:  Randy Peterson    MRN: [id number] DOB: 1946-02-08  09/18/2021  Mr. Randy Peterson was observed post Covid-19 immunization for 15 minutes without incident. He was provided with Vaccine Information Sheet and instruction to access the V-Safe system.   Mr. Randy Peterson was instructed to call 911 with any severe reactions post vaccine: Difficulty breathing  Swelling of face and throat  A fast heartbeat  A bad rash all over body  Dizziness and weakness   Immunizations Administered     Name Date Dose VIS Date Route   Pfizer Covid-19 Vaccine Bivalent Booster 09/18/2021  3:04 PM 0.3 mL 05/16/2021 Intramuscular   Manufacturer: [place]   Lot: [id number]   [place]: [phone]

## 2021-10-11 ENCOUNTER — Telehealth (HOSPITAL_COMMUNITY): Payer: Medicare Other | Admitting: Psychiatry

## 2021-11-09 ENCOUNTER — Telehealth (HOSPITAL_BASED_OUTPATIENT_CLINIC_OR_DEPARTMENT_OTHER): Payer: Medicare Other | Admitting: Psychiatry

## 2021-11-09 ENCOUNTER — Other Ambulatory Visit: Payer: Self-pay

## 2021-11-09 ENCOUNTER — Encounter (HOSPITAL_COMMUNITY): Payer: Self-pay | Admitting: Psychiatry

## 2021-11-09 DIAGNOSIS — F330 Major depressive disorder, recurrent, mild: Secondary | ICD-10-CM

## 2021-11-09 DIAGNOSIS — F419 Anxiety disorder, unspecified: Secondary | ICD-10-CM | POA: Diagnosis not present

## 2021-11-09 MED ORDER — DULOXETINE HCL 20 MG PO CPEP: 20.0000 mg | ORAL_CAPSULE | Freq: Every day | ORAL | 1 refills | Status: DC

## 2021-11-09 MED ORDER — BUPROPION HCL ER (XL) 150 MG PO TB24: 150.0000 mg | ORAL_TABLET | Freq: Every day | ORAL | 1 refills | Status: DC

## 2021-11-09 NOTE — Progress Notes (Signed)
Virtual Visit via Telephone Note  I connected with Randy Peterson on 11/09/21 at  8:40 AM EST by telephone and verified that I am speaking with the correct person using two identifiers.  Location: Patient: Home Provider: Home Office   I discussed the limitations, risks, security and privacy concerns of performing an evaluation and management service by telephone and the availability of in person appointments. I also discussed with the patient that there may be a patient responsible charge related to this service. The patient expressed understanding and agreed to proceed.   History of Present Illness: Patient is evaluated by phone session.  He is taking all his medication.  However he has not taken Xanax in a while as he feels his anxiety is under control.  He denies any panic attack.  He is sleeping good.  Last month he had a cruise trip to [place] and he really enjoyed.  Next week he is going to [place] and is hoping his daughter and her husband may join on spring break.  He enjoys walking on the beach.  He admitted weight gain 2 pounds but now he has a plan to start walking and going to gym on a regular basis.  He has no tremor or shakes or any EPS.  He had a physical in November by Dr. Harlan Stains and he was told everything is normal.  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  He lives with his wife and dogs.  He does not want to change the medication since it is working well.  He denies any anhedonia, crying spells or any feeling of hopelessness.  His appetite is okay.  His energy level is good.  Past Psychiatric History:  H/O inpatient in 2008 for suicidal attempt and cut wrist. Depressed because lost job. Took Paxil, Risperdal and given Vyvanse for attention and focus. Cymbalta and Wellbutrin helped.      No results found for this or any previous visit (from the past 2160 hour(s)).    Psychiatric Specialty Exam: Physical Exam  Review of Systems  Weight  206 lb (93.4 kg).There is no height or weight on file to calculate BMI.  General Appearance: NA  Eye Contact:  NA  Speech:  Clear and Coherent  Volume:  Normal  Mood:  Euthymic  Affect:  NA  Thought Process:  Goal Directed  Orientation:  Full (Time, Place, and Person)  Thought Content:  Logical  Suicidal Thoughts:  No  Homicidal Thoughts:  No  Memory:  Immediate;   Good Recent;   Good Remote;   Good  Judgement:  Intact  Insight:  Present  Psychomotor Activity:  NA  Concentration:  Concentration: Good and Attention Span: Good  Recall:  Good  Fund of Knowledge:  Good  Language:  Good  Akathisia:  No  Handed:  Right  AIMS (if indicated):     Assets:  Communication Skills Desire for Improvement Housing Resilience Social Support Transportation  ADL's:  Intact  Cognition:  WNL  Sleep:   ok      Assessment and Plan: Major depressive disorder, recurrent.  Anxiety.  Patient is stable on his current medication.  He has not taken Xanax since the last visit and does not require a new prescription.  He excited about going to [place] next week.  We will continue Wellbutrin XL 150 mg in the morning and Cymbalta 20 mg daily.  Recommended to call us back if is any question or any  concern.  Follow-up in 6 months.  Follow Up Instructions:    I discussed the assessment and treatment plan with the patient. The patient was provided an opportunity to ask questions and all were answered. The patient agreed with the plan and demonstrated an understanding of the instructions.   The patient was advised to call back or seek an in-person evaluation if the symptoms worsen or if the condition fails to improve as anticipated.  I provided 22 minutes of non-face-to-face time during this encounter.   Kathlee Nations, MD

## 2022-04-21 NOTE — Progress Notes (Addendum)
Cardiology Office Note   Date:  04/23/2022   ID:  Randy Peterson, DOB Mar 01, 1946, MRN [id number]  PCP:  Derinda Late, MD  Cardiologist:   Minus Breeding, MD    Chief Complaint  Patient presents with   Coronary Artery Disease      History of Present Illness: Randy Peterson is a 76 y.o. male who presents for follow-up of inferior myocardial infarction. 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 Perfusion study in Oct 2019 was negative for ischemia.  He returns for follow up.    Since I last saw him he has done well. The patient denies any new symptoms such as chest discomfort, neck or arm discomfort. There has been no new shortness of breath, PND or orthopnea. There have been no reported palpitations, presyncope or syncope.   He does a lot of walking particularly when he is staying at the beach.  He walks his dog.  Past Medical History:  Diagnosis Date   Acid reflux    Acute MI, inferior wall, initial episode of care ([place]) 05/17/15   Angina decubitus ([place]) 05/16/15   Anxiety    Arthritis    both shoulders and back   CAD in native artery    per cath 05/16/14   Depression    Hyperlipidemia LDL goal <70    Prostate enlargement    S/P coronary artery stent placement, DES promus premier, RCA 05/17/15 05/18/2015    Past Surgical History:  Procedure Laterality Date   CARDIAC CATHETERIZATION N/A 05/17/2015   Procedure: Left Heart Cath and Coronary Angiography;  Surgeon: Sherren Mocha, MD;  Location: [facility];  Service: Cardiovascular;  Laterality: N/A;   CARDIAC CATHETERIZATION N/A 05/17/2015   Procedure: Coronary Stent Intervention;  Surgeon: Sherren Mocha, MD;  Location: [facility];  Service: Cardiovascular;  Laterality: N/A;   LUMBAR LAMINECTOMY/DECOMPRESSION MICRODISCECTOMY Bilateral 01/24/2020   Procedure: Laminectomy and Foraminotomy - bilateral - Lumbar Three-Four, Lumbar Four-Five.;  Surgeon: Kary Kos, MD;  Location: [facility];  Service: Neurosurgery;  Laterality: Bilateral;  posterior   None     TOTAL HIP ARTHROPLASTY Right 10/13/2020   Procedure: RIGHT TOTAL HIP ARTHROPLASTY ANTERIOR APPROACH;  Surgeon: Mcarthur Rossetti, MD;  Location: [facility];  Service: Orthopedics;  Laterality: Right;  3E bed     Current Outpatient Medications  Medication Sig Dispense Refill   acetaminophen (TYLENOL) 325 MG tablet Take 2 tablets (650 mg total) by mouth every 4 (four) hours as needed for headache or mild pain.     ALPRAZolam (XANAX) 0.5 MG tablet Take one tab as needed for severe anxiety 15 tablet 0   aspirin (ASPIRIN 81) 81 MG chewable tablet Chew 1 tablet (81 mg total) by mouth 2 (two) times daily at 8 am and 10 pm. 30 tablet 0   atorvastatin (LIPITOR) 80 MG tablet Take 1 tablet (80 mg total) by mouth daily at 6 PM. 30 tablet 6   buPROPion (WELLBUTRIN XL) 150 MG 24 hr tablet Take 1 tablet (150 mg total) by mouth daily. 90 tablet 1   cholecalciferol (VITAMIN D) 1000 UNITS tablet Take 1,000 Units by mouth daily.     COVID-19 mRNA bivalent vaccine, Pfizer, (PFIZER COVID-19 VAC BIVALENT) injection Inject into the muscle. 0.3 mL 0   COVID-19 mRNA vaccine, Pfizer, 30 MCG/0.3ML injection Inject into the muscle. 0.3 mL 0   DULoxetine (CYMBALTA) 20 MG capsule Take 1 capsule (20 mg total) by mouth daily. [place]  capsule 1   ezetimibe (ZETIA) 10 MG tablet Take 10 mg by mouth every evening.     finasteride (PROSCAR) 5 MG tablet Take 5 mg by mouth every evening.      metoprolol tartrate (LOPRESSOR) 25 MG tablet Take 1 tablet (25 mg total) by mouth 2 (two) times daily. 180 tablet 1   nitroGLYCERIN (NITROSTAT) 0.4 MG SL tablet Place 1 tablet (0.4 mg total) under the tongue every 5 (five) minutes as needed for chest pain. 25 tablet 1   RABEprazole (ACIPHEX) 20 MG tablet Take 20 mg by mouth daily.     tamsulosin (FLOMAX) 0.4 MG CAPS capsule Take 0.4 mg by mouth every evening.      XIIDRA 5 % SOLN Place 1 drop into both eyes daily.     No  current facility-administered medications for this visit.    Allergies:   Patient has no known allergies.    ROS:  As stated in the HPI and negative for all other systems.   PHYSICAL EXAM: VS:  BP 120/72   Pulse (!) 51   Ht '5\' 6"'$  (1.676 m)   Wt 208 lb 12.8 oz (94.7 kg)   SpO2 97%   BMI 33.70 kg/m  , BMI Body mass index is 33.7 kg/m.  GENERAL:  Well appearing NECK:  No jugular venous distention, waveform within normal limits, carotid upstroke brisk and symmetric, no bruits, no thyromegaly LUNGS:  Clear to auscultation bilaterally CHEST:  Unremarkable HEART:  PMI not displaced or sustained,S1 and S2 within normal limits, no S3, no S4, no clicks, no rubs, no murmurs ABD:  Flat, positive bowel sounds normal in frequency in pitch, no bruits, no rebound, no guarding, no midline pulsatile mass, no hepatomegaly, no splenomegaly EXT:  2 plus pulses throughout, mild leg edema, no cyanosis no clubbing  EKG:  EKG is  ordered today. Sinus rhythm, rate 51, axis within normal limits, intervals within normal limits, no acute ST-T wave changes.  Recent Labs: No results found for requested labs within last 365 days.     Wt Readings from Last 3 Encounters:  04/23/22 208 lb 12.8 oz (94.7 kg)  04/24/21 204 lb 12.8 oz (92.9 kg)  10/13/20 205 lb 6.4 oz (93.2 kg)      Other studies Reviewed: Additional studies/ records that were reviewed today include:  None Review of the above records demonstrates:   NA    ASSESSMENT AND PLAN:  CAD:  The patient has no new sypmtoms.  No further cardiovascular testing is indicated.  We will continue with aggressive risk reduction and meds as listed.  DYSLIPIDEMIA: I do not have most recent labs and would suggest a goal LDL less than 70 and will defer to Derinda Late, MD  Current medicines are reviewed at length with the patient today.  The patient does not have concerns regarding medicines.  The following changes have been made:   None  Labs/  tests ordered today include:   None  Orders Placed This Encounter  Procedures   EKG 12-Lead      Disposition:   FU with 12 months.      Signed, Minus Breeding, MD  04/23/2022 1:56 PM    [facility]

## 2022-04-23 ENCOUNTER — Encounter: Payer: Self-pay | Admitting: Cardiology

## 2022-04-23 ENCOUNTER — Ambulatory Visit: Payer: Medicare Other | Admitting: Cardiology

## 2022-04-23 VITALS — BP 120/72 | HR 51 | Ht 66.0 in | Wt 208.8 lb

## 2022-04-23 DIAGNOSIS — I2510 Atherosclerotic heart disease of native coronary artery without angina pectoris: Secondary | ICD-10-CM

## 2022-04-23 DIAGNOSIS — E785 Hyperlipidemia, unspecified: Secondary | ICD-10-CM

## 2022-04-23 NOTE — Patient Instructions (Signed)

## 2022-05-09 ENCOUNTER — Telehealth (HOSPITAL_BASED_OUTPATIENT_CLINIC_OR_DEPARTMENT_OTHER): Payer: Medicare Other | Admitting: Psychiatry

## 2022-05-09 ENCOUNTER — Encounter (HOSPITAL_COMMUNITY): Payer: Self-pay | Admitting: Psychiatry

## 2022-05-09 DIAGNOSIS — F419 Anxiety disorder, unspecified: Secondary | ICD-10-CM

## 2022-05-09 DIAGNOSIS — F330 Major depressive disorder, recurrent, mild: Secondary | ICD-10-CM

## 2022-05-09 MED ORDER — DULOXETINE HCL 20 MG PO CPEP: 20.0000 mg | ORAL_CAPSULE | Freq: Every day | ORAL | 1 refills | Status: DC

## 2022-05-09 MED ORDER — BUPROPION HCL ER (XL) 150 MG PO TB24: 150.0000 mg | ORAL_TABLET | Freq: Every day | ORAL | 1 refills | Status: DC

## 2022-05-09 NOTE — Progress Notes (Signed)
Virtual Visit via Telephone Note  I connected with Randy Peterson on 05/09/22 at  8:40 AM EDT by telephone and verified that I am speaking with the correct person using two identifiers.  Location: Patient: Home Provider: Home Office   I discussed the limitations, risks, security and privacy concerns of performing an evaluation and management service by telephone and the availability of in person appointments. I also discussed with the patient that there may be a patient responsible charge related to this service. The patient expressed understanding and agreed to proceed.   History of Present Illness: Patient is evaluated by phone session.  He had a good summer.  Patient told the have a place at the beach and they were spent time back and forth at the beach and then back to [place].  His anxiety and depression is a stable.  He has not taken Xanax in almost 2 years.  He denies any crying spells or any feeling of hopelessness or worthlessness.  He has no tremors, shakes or any EPS.  He sleeps good.  He denies any mania, anger, suicidal thoughts.  His appetite is okay and his weight is stable.  Patient lives with his wife and he has dogs.  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  He wants to keep his current medication.  Recently had a visit with his cardiology and they did go very well.    Past Psychiatric History:  H/O inpatient in 2008 for suicidal attempt and cut wrist. Depressed because lost job. Took Paxil, Risperdal and given Vyvanse for attention and focus. Cymbalta and Wellbutrin helped.      Psychiatric Specialty Exam: Physical Exam  Review of Systems  Weight 208 lb (94.3 kg).There is no height or weight on file to calculate BMI.  General Appearance: NA  Eye Contact:  NA  Speech:  Clear and Coherent and Slow  Volume:  Normal  Mood:  Euthymic  Affect:  NA  Thought Process:  Goal Directed  Orientation:  Full (Time, Place, and Person)   Thought Content:  Logical  Suicidal Thoughts:  No  Homicidal Thoughts:  No  Memory:  Immediate;   Good Recent;   Good Remote;   Good  Judgement:  Good  Insight:  Present  Psychomotor Activity:  NA  Concentration:  Concentration: Good and Attention Span: Good  Recall:  Good  Fund of Knowledge:  Good  Language:  Good  Akathisia:  No  Handed:  Right  AIMS (if indicated):     Assets:  Communication Skills Desire for Improvement Housing Leisure Time Resilience Social Support Transportation  ADL's:  Intact  Cognition:  WNL  Sleep:   ok      Assessment and Plan: Major depressive disorder, recurrent.  Anxiety.  Patient is stable on his current medication.  He like to keep the Cymbalta and Wellbutrin since it is working really well.  He has not taken Xanax in more than a year and a half.  Continue Cymbalta 20 mg daily and Wellbutrin XL 150 mg in the morning.  Recommended to call us back if he has any question or any concern.  Follow-up in 6 months.  Follow Up Instructions:    I discussed the assessment and treatment plan with the patient. The patient was provided an opportunity to ask questions and all were answered. The patient agreed with the plan and demonstrated an understanding of the instructions.   The patient was advised to  call back or seek an in-person evaluation if the symptoms worsen or if the condition fails to improve as anticipated.  Collaboration of Care: Primary Care Provider AEB notes are available in epic to review.  Patient/Guardian was advised Release of Information must be obtained prior to any record release in order to collaborate their care with an outside provider. Patient/Guardian was advised if they have not already done so to contact the registration department to sign all necessary forms in order for [place] to release information regarding their care.   Consent: Patient/Guardian gives verbal consent for treatment and assignment of benefits for services  provided during this visit. Patient/Guardian expressed understanding and agreed to proceed.    I provided 15 minutes of non-face-to-face time during this encounter.   Kathlee Nations, MD

## 2022-05-10 ENCOUNTER — Telehealth: Payer: Self-pay | Admitting: Internal Medicine

## 2022-05-10 NOTE — Telephone Encounter (Signed)
Good morning Dr. Hilarie Fredrickson,  This patient has requested a transfer of care to you as his former gastroenterologist, Dr. Earlean Shawl, has retired and he wants to switch his care to our facility.  His records are being sent to you for your review.  Please let me know how you would like to proceed.  Thank you.

## 2022-05-13 ENCOUNTER — Encounter: Payer: Self-pay | Admitting: Internal Medicine

## 2022-05-13 NOTE — Telephone Encounter (Signed)
History of adenomatous polyps removed by Dr. Earlean Shawl in 2018 5-year surveillance colonoscopy okay to be scheduled with me I have requested records be scanned into EMR

## 2022-06-05 ENCOUNTER — Ambulatory Visit (AMBULATORY_SURGERY_CENTER): Payer: Medicare Other | Admitting: *Deleted

## 2022-06-05 VITALS — Ht 66.0 in | Wt 208.4 lb

## 2022-06-05 DIAGNOSIS — Z86010 Personal history of colonic polyps: Secondary | ICD-10-CM

## 2022-06-05 MED ORDER — NA SULFATE-K SULFATE-MG SULF 17.5-3.13-1.6 GM/177ML PO SOLN: 1.0000 | Freq: Once | ORAL | 0 refills | Status: AC

## 2022-06-05 NOTE — Progress Notes (Signed)
No egg or soy allergy known to patient  No issues known to pt with past sedation with any surgeries or procedures Patient denies ever being told they had issues or difficulty with intubation  No FH of Malignant Hyperthermia Pt is not on diet pills Pt is not on home 02  Pt is not on blood thinners  Pt denies issues with constipation  No A fib or A flutter Have any cardiac testing pending--NO Pt instructed to use Singlecare.com or GoodRx for a price reduction on prep   

## 2022-06-12 ENCOUNTER — Encounter: Payer: Self-pay | Admitting: Internal Medicine

## 2022-06-22 ENCOUNTER — Encounter: Payer: Self-pay | Admitting: Certified Registered Nurse Anesthetist

## 2022-06-26 ENCOUNTER — Encounter: Payer: Self-pay | Admitting: Internal Medicine

## 2022-06-26 ENCOUNTER — Ambulatory Visit (AMBULATORY_SURGERY_CENTER): Payer: Medicare Other | Admitting: Internal Medicine

## 2022-06-26 VITALS — BP 107/75 | HR 71 | Temp 95.3°F | Resp 15 | Ht 66.0 in | Wt 208.4 lb

## 2022-06-26 DIAGNOSIS — D120 Benign neoplasm of cecum: Secondary | ICD-10-CM | POA: Diagnosis not present

## 2022-06-26 DIAGNOSIS — Z09 Encounter for follow-up examination after completed treatment for conditions other than malignant neoplasm: Secondary | ICD-10-CM | POA: Diagnosis present

## 2022-06-26 DIAGNOSIS — Z86010 Personal history of colonic polyps: Secondary | ICD-10-CM | POA: Diagnosis not present

## 2022-06-26 DIAGNOSIS — D122 Benign neoplasm of ascending colon: Secondary | ICD-10-CM | POA: Diagnosis not present

## 2022-06-26 DIAGNOSIS — D123 Benign neoplasm of transverse colon: Secondary | ICD-10-CM

## 2022-06-26 MED ORDER — SODIUM CHLORIDE 0.9 % IV SOLN: 500.0000 mL | Freq: Once | INTRAVENOUS | Status: DC

## 2022-06-26 NOTE — Patient Instructions (Addendum)
Handouts provided on polyps, hemorrhoids, diverticulosis and high fiber diet.   Start using Metamucil (fiber supplement) daily.   Continue present medications. Await pathology results.   YOU HAD AN ENDOSCOPIC PROCEDURE TODAY AT [facility]:   Refer to the procedure report that was given to you for any specific questions about what was found during the examination.  If the procedure report does not answer your questions, please call your gastroenterologist to clarify.  If you requested that your care partner not be given the details of your procedure findings, then the procedure report has been included in a sealed envelope for you to review at your convenience later.  YOU SHOULD EXPECT: Some feelings of bloating in the abdomen. Passage of more gas than usual.  Walking can help get rid of the air that was put into your GI tract during the procedure and reduce the bloating. If you had a lower endoscopy (such as a colonoscopy or flexible sigmoidoscopy) you may notice spotting of blood in your stool or on the toilet paper. If you underwent a bowel prep for your procedure, you may not have a normal bowel movement for a few days.  Please Note:  You might notice some irritation and congestion in your nose or some drainage.  This is from the oxygen used during your procedure.  There is no need for concern and it should clear up in a day or so.  SYMPTOMS TO REPORT IMMEDIATELY:  Following lower endoscopy (colonoscopy or flexible sigmoidoscopy):  Excessive amounts of blood in the stool  Significant tenderness or worsening of abdominal pains  Swelling of the abdomen that is new, acute  Fever of 100F or higher  For urgent or emergent issues, a gastroenterologist can be reached at any hour by calling [phone]. Do not use MyChart messaging for urgent concerns.    DIET:  We do recommend a small meal at first, but then you may proceed to your regular diet.  Drink plenty of fluids but  you should avoid alcoholic beverages for 24 hours.  ACTIVITY:  You should plan to take it easy for the rest of today and you should NOT DRIVE or use heavy machinery until tomorrow (because of the sedation medicines used during the test).    FOLLOW UP: Our staff will call the number listed on your records the next business day following your procedure.  We will call around 7:15- 8:00 am to check on you and address any questions or concerns that you may have regarding the information given to you following your procedure. If we do not reach you, we will leave a message.     If any biopsies were taken you will be contacted by phone or by letter within the next 1-3 weeks.  Please call us at [phone] if you have not heard about the biopsies in 3 weeks.    SIGNATURES/CONFIDENTIALITY: You and/or your care partner have signed paperwork which will be entered into your electronic medical record.  These signatures attest to the fact that that the information above on your After Visit Summary has been reviewed and is understood.  Full responsibility of the confidentiality of this discharge information lies with you and/or your care-partner.

## 2022-06-26 NOTE — Op Note (Signed)
[facility] Patient Name: Randy Peterson Procedure Date: 06/26/2022 7:56 AM MRN: [id number] Endoscopist: Jerene Bears , MD Age: 76 Referring MD:  Date of Birth: 1946/06/05 Gender: Male Account #: [id number] Procedure:                Colonoscopy Indications:              Surveillance: Personal history of adenomatous                            polyps on last colonoscopy 5 years ago (Dr. Earlean Shawl                            patient until he retired recently) Medicines:                Monitored Anesthesia Care Procedure:                Pre-Anesthesia Assessment:                           - Prior to the procedure, a History and Physical                            was performed, and patient medications and                            allergies were reviewed. The patient's tolerance of                            previous anesthesia was also reviewed. The risks                            and benefits of the procedure and the sedation                            options and risks were discussed with the patient.                            All questions were answered, and informed consent                            was obtained. Prior Anticoagulants: The patient has                            taken no previous anticoagulant or antiplatelet                            agents. ASA Grade Assessment: III - A patient with                            severe systemic disease. After reviewing the risks                            and benefits, the patient was deemed in  satisfactory condition to undergo the procedure.                           After obtaining informed consent, the colonoscope                            was passed under direct vision. Throughout the                            procedure, the patient's blood pressure, pulse, and                            oxygen saturations were monitored continuously. The                            CF [id number] #[id number] was  introduced through the anus                            and advanced to the cecum, identified by                            appendiceal orifice and ileocecal valve. The                            colonoscopy was performed without difficulty. The                            patient tolerated the procedure well. The quality                            of the bowel preparation was good. The ileocecal                            valve, appendiceal orifice, and rectum were                            photographed. Scope In: 8:28:20 AM Scope Out: 8:49:06 AM Scope Withdrawal Time: 0 hours 16 minutes 18 seconds  Total Procedure Duration: 0 hours 20 minutes 46 seconds  Findings:                 The digital rectal exam was normal.                           A 5 mm polyp was found in the cecum. The polyp was                            sessile. The polyp was removed with a cold snare.                            Resection and retrieval were complete.                           Three sessile polyps were found in the ascending  colon. The polyps were 3 to 6 mm in size. These                            polyps were removed with a cold snare. Resection                            and retrieval were complete.                           Two sessile polyps were found in the hepatic                            flexure. The polyps were 4 to 9 mm in size. These                            polyps were removed with a cold snare. Resection                            and retrieval were complete.                           Ten sessile polyps were found in the transverse                            colon. The polyps were 2 to 7 mm in size. These                            polyps were removed with a cold snare. Resection                            and retrieval were complete.                           Many small and large-mouthed diverticula were found                            in the sigmoid colon,  descending colon, proximal                            transverse colon, hepatic flexure and ascending                            colon.                           Internal hemorrhoids were found during                            retroflexion. The hemorrhoids were small. Complications:            No immediate complications. Estimated Blood Loss:     Estimated blood loss was minimal. Impression:               - One 5 mm polyp in the cecum, removed with a cold  snare. Resected and retrieved.                           - Three 3 to 6 mm polyps in the ascending colon,                            removed with a cold snare. Resected and retrieved.                           - Two 4 to 9 mm polyps at the hepatic flexure,                            removed with a cold snare. Resected and retrieved.                           - Ten 2 to 7 mm polyps in the transverse colon,                            removed with a cold snare. Resected and retrieved.                           - Severe diverticulosis in the sigmoid colon, in                            the descending colon, in the proximal transverse                            colon, at the hepatic flexure and in the ascending                            colon.                           - Small internal hemorrhoids with previous banding                            scars in the distal rectum. Recommendation:           - Patient has a contact number available for                            emergencies. The signs and symptoms of potential                            delayed complications were discussed with the                            patient. Return to normal activities tomorrow.                            Written discharge instructions were provided to the                            patient.                           -  Resume previous diet.                           - Continue present medications.                           - Await  pathology results.                           - Repeat colonoscopy is recommended for                            surveillance. The colonoscopy date will be                            determined after pathology results from today's                            exam become available for review. Jerene Bears, MD 06/26/2022 8:58:11 AM This report has been signed electronically.

## 2022-06-26 NOTE — Progress Notes (Signed)
GASTROENTEROLOGY PROCEDURE H&P NOTE   Primary Care Physician: Derinda Late, MD    Reason for Procedure:   Hx of polyps   Plan:    Colonoscopy   Patient is appropriate for endoscopic procedure(s) in the ambulatory ([facility]) setting.  The nature of the procedure, as well as the risks, benefits, and alternatives were carefully and thoroughly reviewed with the patient. Ample time for discussion and questions allowed. The patient understood, was satisfied, and agreed to proceed.     HPI: Randy Peterson is a 76 y.o. male who presents for surveillance colonoscopy.  Medical history as below.  Tolerated the prep.  No recent chest pain or shortness of breath.  No abdominal pain today.  Past Medical History:  Diagnosis Date   Acid reflux    Acute MI, inferior wall, initial episode of care ([facility]) 05/17/15   Angina decubitus 05/16/15   Anxiety    Arthritis    both shoulders and back   CAD in native artery    per cath 05/16/14   Depression    Hyperlipidemia LDL goal <70    Prostate enlargement    S/P coronary artery stent placement, DES promus premier, RCA 05/17/15 05/18/2015    Past Surgical History:  Procedure Laterality Date   CARDIAC CATHETERIZATION N/A 05/17/2015   Procedure: Left Heart Cath and Coronary Angiography;  Surgeon: Sherren Mocha, MD;  Location: [facility];  Service: Cardiovascular;  Laterality: N/A;   CARDIAC CATHETERIZATION N/A 05/17/2015   Procedure: Coronary Stent Intervention;  Surgeon: Sherren Mocha, MD;  Location: [facility];  Service: Cardiovascular;  Laterality: N/A;   COLONOSCOPY     LUMBAR LAMINECTOMY/DECOMPRESSION MICRODISCECTOMY Bilateral 01/24/2020   Procedure: Laminectomy and Foraminotomy - bilateral - Lumbar Three-Four, Lumbar Four-Five.;  Surgeon: Kary Kos, MD;  Location: [facility];  Service: Neurosurgery;  Laterality: Bilateral;  posterior   None     TOTAL HIP ARTHROPLASTY Right 10/13/2020   Procedure: RIGHT TOTAL HIP ARTHROPLASTY  ANTERIOR APPROACH;  Surgeon: Mcarthur Rossetti, MD;  Location: [facility];  Service: Orthopedics;  Laterality: Right;  3E bed    Prior to Admission medications   Medication Sig Start Date End Date Taking? Authorizing Provider  aspirin EC 81 MG tablet Take 81 mg by mouth daily. Swallow whole.   Yes [provider]  atorvastatin (LIPITOR) 80 MG tablet Take 1 tablet (80 mg total) by mouth daily at 6 PM. 05/18/15  Yes Isaiah Serge, NP  buPROPion (WELLBUTRIN XL) 150 MG 24 hr tablet Take 1 tablet (150 mg total) by mouth daily. 05/09/22  Yes Arfeen, Arlyce Harman, MD  cholecalciferol (VITAMIN D) 1000 UNITS tablet Take 1,000 Units by mouth daily.   Yes [provider]  DULoxetine (CYMBALTA) 20 MG capsule Take 1 capsule (20 mg total) by mouth daily. 05/09/22  Yes Arfeen, Arlyce Harman, MD  ezetimibe (ZETIA) 10 MG tablet Take 10 mg by mouth every evening.   Yes [provider]  finasteride (PROSCAR) 5 MG tablet Take 5 mg by mouth every evening.    Yes [provider]  hyoscyamine (LEVSIN SL) 0.125 MG SL tablet Place 0.125 mg under the tongue as needed.   Yes [provider]  metoprolol tartrate (LOPRESSOR) 25 MG tablet Take 1 tablet (25 mg total) by mouth 2 (two) times daily. 07/04/16  Yes Minus Breeding, MD  OVER THE COUNTER MEDICATION daily. ALLER-FLO TAKE 2 SPRAYS EACH NOSTRIL   Yes [provider]  RABEprazole (ACIPHEX) 20 MG tablet Take  20 mg by mouth daily.   Yes [provider]  tamsulosin (FLOMAX) 0.4 MG CAPS capsule Take 0.4 mg by mouth every evening.    Yes [provider]  XIIDRA 5 % SOLN Place 1 drop into both eyes daily. 04/07/20  Yes [provider]  acetaminophen (TYLENOL) 325 MG tablet Take 2 tablets (650 mg total) by mouth every 4 (four) hours as needed for headache or mild pain. 05/18/15   Isaiah Serge, NP  ALPRAZolam Duanne Moron) 0.5 MG tablet Take one tab as needed for severe anxiety Patient not taking: Reported on  05/09/2022 04/20/20   Arfeen, Arlyce Harman, MD  COVID-19 mRNA bivalent vaccine, Pfizer, (PFIZER COVID-19 VAC BIVALENT) injection Inject into the muscle. Patient not taking: Reported on 06/05/2022 09/18/21   Carlyle Basques, MD  COVID-19 mRNA vaccine, Pfizer, 30 MCG/0.3ML injection Inject into the muscle. Patient not taking: Reported on 06/05/2022 12/28/20   Carlyle Basques, MD  nitroGLYCERIN (NITROSTAT) 0.4 MG SL tablet Place 1 tablet (0.4 mg total) under the tongue every 5 (five) minutes as needed for chest pain. Patient not taking: Reported on 06/05/2022 04/24/21   Minus Breeding, MD    Current Outpatient Medications  Medication Sig Dispense Refill   aspirin EC 81 MG tablet Take 81 mg by mouth daily. Swallow whole.     atorvastatin (LIPITOR) 80 MG tablet Take 1 tablet (80 mg total) by mouth daily at 6 PM. 30 tablet 6   buPROPion (WELLBUTRIN XL) 150 MG 24 hr tablet Take 1 tablet (150 mg total) by mouth daily. 90 tablet 1   cholecalciferol (VITAMIN D) 1000 UNITS tablet Take 1,000 Units by mouth daily.     DULoxetine (CYMBALTA) 20 MG capsule Take 1 capsule (20 mg total) by mouth daily. 90 capsule 1   ezetimibe (ZETIA) 10 MG tablet Take 10 mg by mouth every evening.     finasteride (PROSCAR) 5 MG tablet Take 5 mg by mouth every evening.      hyoscyamine (LEVSIN SL) 0.125 MG SL tablet Place 0.125 mg under the tongue as needed.     metoprolol tartrate (LOPRESSOR) 25 MG tablet Take 1 tablet (25 mg total) by mouth 2 (two) times daily. 180 tablet 1   OVER THE COUNTER MEDICATION daily. ALLER-FLO TAKE 2 SPRAYS EACH NOSTRIL     RABEprazole (ACIPHEX) 20 MG tablet Take 20 mg by mouth daily.     tamsulosin (FLOMAX) 0.4 MG CAPS capsule Take 0.4 mg by mouth every evening.      XIIDRA 5 % SOLN Place 1 drop into both eyes daily.     acetaminophen (TYLENOL) 325 MG tablet Take 2 tablets (650 mg total) by mouth every 4 (four) hours as needed for headache or mild pain.     ALPRAZolam (XANAX) 0.5 MG tablet Take one tab as needed  for severe anxiety (Patient not taking: Reported on 05/09/2022) 15 tablet 0   COVID-19 mRNA bivalent vaccine, Pfizer, (PFIZER COVID-19 VAC BIVALENT) injection Inject into the muscle. (Patient not taking: Reported on 06/05/2022) 0.3 mL 0   COVID-19 mRNA vaccine, Pfizer, 30 MCG/0.3ML injection Inject into the muscle. (Patient not taking: Reported on 06/05/2022) 0.3 mL 0   nitroGLYCERIN (NITROSTAT) 0.4 MG SL tablet Place 1 tablet (0.4 mg total) under the tongue every 5 (five) minutes as needed for chest pain. (Patient not taking: Reported on 06/05/2022) 25 tablet 1   Current Facility-Administered Medications  Medication Dose Route Frequency Provider Last Rate Last Admin   0.9 %  sodium chloride  infusion  500 mL Intravenous Once Vence Lalor, Lajuan Lines, MD        Allergies as of 06/26/2022   (No Known Allergies)    Family History  Problem Relation Age of Onset   Dementia Mother    Stroke Mother    Heart attack Father 14   Suicidality Brother    Depression Brother    Heart attack Brother 16   Hypertension Neg Hx    Colon cancer Neg Hx    Colon polyps Neg Hx    Crohn's disease Neg Hx    Esophageal cancer Neg Hx    Rectal cancer Neg Hx    Stomach cancer Neg Hx    Ulcerative colitis Neg Hx     Social History   Socioeconomic History   Marital status: Married    Spouse name: Not on file   Number of children: 1   Years of education: Not on file   Highest education level: Not on file  Occupational History   Occupation: RETIRED    Comment: [employer]  Tobacco Use   Smoking status: Former    Packs/day: 0.25    Years: 40.00    Total pack years: 10.00    Types: Cigarettes    Passive exposure: Never   Smokeless tobacco: Never   Tobacco comments:    Quit 20 years ago  Vaping Use   Vaping Use: Never used  Substance and Sexual Activity   Alcohol use: Yes    Alcohol/week: 2.0 standard drinks of alcohol    Types: 2 Cans of beer per week    Comment: 1-2 X WEEK   Drug use: No   Sexual  activity: Yes    Partners: Female    Birth control/protection: None  Other Topics Concern   Not on file  Social History Narrative   Retired Investment banker, corporate   Social Determinants of Radio broadcast assistant Strain: Not on file  Food Insecurity: Not on file  Transportation Needs: Not on file  Physical Activity: Not on file  Stress: Not on file  Social Connections: Not on file  Intimate Partner Violence: Not on file    Physical Exam: Vital signs in last 24 hours: '@BP'$  123/62   Pulse 67   Temp (!) 95.3 F (35.2 C) (Temporal)   Ht '5\' 6"'$  (1.676 m)   Wt 208 lb 6.4 oz (94.5 kg)   SpO2 98%   BMI 33.64 kg/m  GEN: NAD EYE: Sclerae anicteric ENT: MMM CV: Non-tachycardic Pulm: CTA b/l GI: Soft, NT/ND NEURO:  Alert & Oriented x 3   Zenovia Jarred, MD [facility]  06/26/2022 8:20 AM

## 2022-06-26 NOTE — Progress Notes (Signed)
Called to room to assist during endoscopic procedure.  Patient ID and intended procedure confirmed with present staff. Received instructions for my participation in the procedure from the performing physician.  

## 2022-06-26 NOTE — Progress Notes (Signed)
Pt's states no medical or surgical changes since previsit or office visit. 

## 2022-06-26 NOTE — Progress Notes (Signed)
VSS, transported to PACU °

## 2022-06-27 ENCOUNTER — Telehealth: Payer: Self-pay | Admitting: *Deleted

## 2022-06-27 NOTE — Telephone Encounter (Signed)
  Follow up Call-     06/26/2022    7:36 AM  Call back number  Post procedure Call Back phone  # [phone]  Permission to leave phone message Yes     Patient questions:  Do you have a fever, pain , or abdominal swelling? No. Pain Score  0 *  Have you tolerated food without any problems? Yes.    Have you been able to return to your normal activities? Yes.    Do you have any questions about your discharge instructions: Diet   No. Medications  No. Follow up visit  No.  Do you have questions or concerns about your Care? No.  Actions: * If pain score is 4 or above: No action needed, pain <4.

## 2022-07-03 ENCOUNTER — Encounter: Payer: Self-pay | Admitting: Internal Medicine

## 2022-10-08 ENCOUNTER — Encounter (HOSPITAL_COMMUNITY): Payer: Self-pay | Admitting: Psychiatry

## 2022-10-08 ENCOUNTER — Telehealth (HOSPITAL_BASED_OUTPATIENT_CLINIC_OR_DEPARTMENT_OTHER): Payer: Medicare Other | Admitting: Psychiatry

## 2022-10-08 DIAGNOSIS — F330 Major depressive disorder, recurrent, mild: Secondary | ICD-10-CM

## 2022-10-08 DIAGNOSIS — F419 Anxiety disorder, unspecified: Secondary | ICD-10-CM

## 2022-10-08 MED ORDER — DULOXETINE HCL 20 MG PO CPEP: 20.0000 mg | ORAL_CAPSULE | Freq: Every day | ORAL | 1 refills | Status: DC

## 2022-10-08 MED ORDER — BUPROPION HCL ER (XL) 150 MG PO TB24: 150.0000 mg | ORAL_TABLET | Freq: Every day | ORAL | 1 refills | Status: DC

## 2022-10-08 NOTE — Progress Notes (Signed)
Virtual Visit via Video Note  I connected with Randy Peterson on 10/08/22 at  1:40 PM EST by a video enabled telemedicine application and verified that I am speaking with the correct person using two identifiers.  Location: Patient: Home Provider: Home Office   I discussed the limitations of evaluation and management by telemedicine and the availability of in person appointments. The patient expressed understanding and agreed to proceed.  History of Present Illness: Patient is evaluated by video session.  He has been doing well.  In November he went to [place] with his wife and had a good time watching polar bear.  He feels it is one of the exciting trip and he really enjoyed.  Overall he feels his depression anxiety is stable.  He denies any crying spells or any feeling of hopelessness or worthlessness.  He sleeps good.  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  He recently had a visit with his PCP Dr. Sheffield Slider at [facility].  He also has a colonoscopy and found multiple polyps and required another colonoscopy in October.  He wants to keep the Cymbalta and Wellbutrin.  He has no tremors, shakes or any EPS.  His appetite is okay.  He started gym recently and like to focus on his health and weight.  He denies any major panic attack.  He has not taken Xanax since 2 years.     Past Psychiatric History:  H/O inpatient in 2008 for suicidal attempt and cut wrist after he lost the job. Took Paxil, Risperdal and given Vyvanse for attention and focus.  Took Xanax for a while until does not needed.  Cymbalta and Wellbutrin helped.      Psychiatric Specialty Exam: Physical Exam  Review of Systems  Weight 206 lb (93.4 kg).There is no height or weight on file to calculate BMI.  General Appearance: Casual  Eye Contact:  Good  Speech:  Clear and Coherent  Volume:  Normal  Mood:  Euthymic  Affect:  Appropriate  Thought Process:  Goal Directed   Orientation:  Full (Time, Place, and Person)  Thought Content:  Logical  Suicidal Thoughts:  No  Homicidal Thoughts:  No  Memory:  Immediate;   Good Recent;   Good Remote;   Good  Judgement:  Good  Insight:  Good  Psychomotor Activity:  Normal  Concentration:  Concentration: Good and Attention Span: Good  Recall:  Good  Fund of Knowledge:  Good  Language:  Good  Akathisia:  No  Handed:  Right  AIMS (if indicated):     Assets:  Communication Skills Desire for Improvement Housing Resilience Social Support Transportation  ADL's:  Intact  Cognition:  WNL  Sleep:   ok     Assessment and Plan: Major depressive disorder, recurrent.  Anxiety.  Patient is stable on his current medication.  Continue Wellbutrin XL 150 mg in the morning and Cymbalta 20 mg daily.  Recommended to call us back if is any question or any concern.  Follow-up in 6 months.  Follow Up Instructions:    I discussed the assessment and treatment plan with the patient. The patient was provided an opportunity to ask questions and all were answered. The patient agreed with the plan and demonstrated an understanding of the instructions.   The patient was advised to call back or seek an in-person evaluation if the symptoms worsen or if the condition fails to improve as anticipated.  Collaboration of Care:  Other provider involved in patient's care AEB notes are available in epic to review.  Patient/Guardian was advised Release of Information must be obtained prior to any record release in order to collaborate their care with an outside provider. Patient/Guardian was advised if they have not already done so to contact the registration department to sign all necessary forms in order for [place] to release information regarding their care.   Consent: Patient/Guardian gives verbal consent for treatment and assignment of benefits for services provided during this visit. Patient/Guardian expressed understanding and agreed to  proceed.    I provided 17 minutes of non-face-to-face time during this encounter.   Kathlee Nations, MD

## 2022-11-07 ENCOUNTER — Telehealth (HOSPITAL_COMMUNITY): Payer: Medicare Other | Admitting: Psychiatry

## 2023-03-26 ENCOUNTER — Encounter (HOSPITAL_COMMUNITY): Payer: Self-pay | Admitting: Psychiatry

## 2023-03-26 ENCOUNTER — Telehealth (HOSPITAL_BASED_OUTPATIENT_CLINIC_OR_DEPARTMENT_OTHER): Payer: Medicare Other | Admitting: Psychiatry

## 2023-03-26 VITALS — Wt 208.0 lb

## 2023-03-26 DIAGNOSIS — F330 Major depressive disorder, recurrent, mild: Secondary | ICD-10-CM

## 2023-03-26 DIAGNOSIS — F419 Anxiety disorder, unspecified: Secondary | ICD-10-CM

## 2023-03-26 MED ORDER — BUPROPION HCL ER (XL) 150 MG PO TB24: 150.0000 mg | ORAL_TABLET | Freq: Every day | ORAL | 1 refills | Status: DC

## 2023-03-26 MED ORDER — DULOXETINE HCL 20 MG PO CPEP: 20.0000 mg | ORAL_CAPSULE | Freq: Every day | ORAL | 1 refills | Status: DC

## 2023-03-26 NOTE — Progress Notes (Signed)
[facility] MD Virtual Progress Note   Patient Location: [facility] Provider Location: Home Office  I connect with patient by telephone and verified that I am speaking with correct person by using two identifiers. I discussed the limitations of evaluation and management by telemedicine and the availability of in person appointments. I also discussed with the patient that there may be a patient responsible charge related to this service. The patient expressed understanding and agreed to proceed.  Randy Peterson [id number] 76 y.o.  03/26/2023 10:47 AM  History of Present Illness:  Patient is evaluated by phone session.  He is only at [facility] in joint vacation with his wife.  He reported things are going okay and denies any anxiety or panic attack.  He likes his current medication.  He denies any feeling of hopelessness or worthlessness.  His appetite is okay.  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  His appetite is okay.  His weight is stable.  He has no tremor or shakes or any EPS.  He is excited as his daughter is to in August and that will be his first grandchild.  Patient told him and his wife are looking forward for this change.  He denies drinking or using any illegal substances.  He sleeps good.  He is taking the Wellbutrin and Cymbalta.    Past Psychiatric History: H/O inpatient in 2008 for suicidal attempt and cut wrist after he lost the job. Took Paxil, Risperdal and given Vyvanse for attention and focus.  Took Xanax for a while until does not needed.  Cymbalta and Wellbutrin helped.        Outpatient Encounter Medications as of 03/26/2023  Medication Sig   acetaminophen (TYLENOL) 325 MG tablet Take 2 tablets (650 mg total) by mouth every 4 (four) hours as needed for headache or mild pain.   aspirin EC 81 MG tablet Take 81 mg by mouth daily. Swallow whole.   atorvastatin (LIPITOR) 80 MG tablet Take 1 tablet (80 mg  total) by mouth daily at 6 PM.   buPROPion (WELLBUTRIN XL) 150 MG 24 hr tablet Take 1 tablet (150 mg total) by mouth daily.   cholecalciferol (VITAMIN D) 1000 UNITS tablet Take 1,000 Units by mouth daily.   COVID-19 mRNA bivalent vaccine, Pfizer, (PFIZER COVID-19 VAC BIVALENT) injection Inject into the muscle. (Patient not taking: Reported on 06/05/2022)   COVID-19 mRNA vaccine, Pfizer, 30 MCG/0.3ML injection Inject into the muscle. (Patient not taking: Reported on 06/05/2022)   DULoxetine (CYMBALTA) 20 MG capsule Take 1 capsule (20 mg total) by mouth daily.   ezetimibe (ZETIA) 10 MG tablet Take 10 mg by mouth every evening.   finasteride (PROSCAR) 5 MG tablet Take 5 mg by mouth every evening.    hyoscyamine (LEVSIN SL) 0.125 MG SL tablet Place 0.125 mg under the tongue as needed.   metoprolol tartrate (LOPRESSOR) 25 MG tablet Take 1 tablet (25 mg total) by mouth 2 (two) times daily.   nitroGLYCERIN (NITROSTAT) 0.4 MG SL tablet Place 1 tablet (0.4 mg total) under the tongue every 5 (five) minutes as needed for chest pain. (Patient not taking: Reported on 06/05/2022)   RABEprazole (ACIPHEX) 20 MG tablet Take 20 mg by mouth daily.   tamsulosin (FLOMAX) 0.4 MG CAPS capsule Take 0.4 mg by mouth every evening.    XIIDRA 5 % SOLN Place 1 drop into both eyes daily.   No facility-administered encounter medications on file  as of 03/26/2023.    No results found for this or any previous visit (from the past 2160 hour(s)).   Psychiatric Specialty Exam: Physical Exam  Review of Systems  Weight 208 lb (94.3 kg).There is no height or weight on file to calculate BMI.  General Appearance: NA  Eye Contact:  NA  Speech:  Clear and Coherent and Normal Rate  Volume:  Normal  Mood:  Euthymic  Affect:  Appropriate  Thought Process:  Goal Directed  Orientation:  Full (Time, Place, and Person)  Thought Content:  WDL  Suicidal Thoughts:  No  Homicidal Thoughts:  No  Memory:  Immediate;   Good Recent;    Good Remote;   Good  Judgement:  Good  Insight:  Present  Psychomotor Activity:  Normal  Concentration:  Concentration: Good and Attention Span: Good  Recall:  Good  Fund of Knowledge:  Good  Language:  Good  Akathisia:  No  Handed:  Right  AIMS (if indicated):     Assets:  Communication Skills Desire for Improvement Housing Resilience Social Support Transportation  ADL's:  Intact  Cognition:  WNL  Sleep:  ok     Assessment/Plan: Major depressive disorder, recurrent episode, mild (HCC) - Plan: buPROPion (WELLBUTRIN XL) 150 MG 24 hr tablet, DULoxetine (CYMBALTA) 20 MG capsule  Anxiety - Plan: buPROPion (WELLBUTRIN XL) 150 MG 24 hr tablet, DULoxetine (CYMBALTA) 20 MG capsule  Stable and taking his medication as prescribed.  He has no major concern.  Continue Wellbutrin XL 150 mg in the morning and Cymbalta 20 mg daily.  Recommend to call us back if is any question or any concern.  Follow-up in 6 months.   Follow Up Instructions:     I discussed the assessment and treatment plan with the patient. The patient was provided an opportunity to ask questions and all were answered. The patient agreed with the plan and demonstrated an understanding of the instructions.   The patient was advised to call back or seek an in-person evaluation if the symptoms worsen or if the condition fails to improve as anticipated.    Collaboration of Care: Other provider involved in patient's care AEB notes are available in epic to review.  Patient/Guardian was advised Release of Information must be obtained prior to any record release in order to collaborate their care with an outside provider. Patient/Guardian was advised if they have not already done so to contact the registration department to sign all necessary forms in order for [place] to release information regarding their care.   Consent: Patient/Guardian gives verbal consent for treatment and assignment of benefits for services provided during  this visit. Patient/Guardian expressed understanding and agreed to proceed.     I provided 12 minutes of non face to face time during this encounter.  Note: This document was prepared by [employer] voice dictation technology and any errors that results from this process are unintentional.    Cleotis Nipper, MD 03/26/2023

## 2023-04-04 ENCOUNTER — Encounter: Payer: Self-pay | Admitting: Internal Medicine

## 2023-04-04 ENCOUNTER — Telehealth: Payer: Self-pay | Admitting: Internal Medicine

## 2023-04-04 NOTE — Telephone Encounter (Signed)
Error

## 2023-04-08 ENCOUNTER — Telehealth (HOSPITAL_COMMUNITY): Payer: Self-pay | Admitting: Psychiatry

## 2023-04-20 NOTE — Progress Notes (Unsigned)
  Cardiology Office Note:   Date:  04/21/2023  ID:  Randy Peterson, DOB April 25, 1946, MRN [id number] PCP: Mosetta Putt, MD  [facility] Providers Cardiologist:  Rollene Rotunda, MD {  History of Present Illness:   Randy Peterson is a 77 y.o. male who presents for follow-up of inferior myocardial infarction. 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 Perfusion study in Oct 2019 was negative for ischemia.  He returns for follow up.    Since I last saw him he has been doing well.  He goes for walks at the beach with his dog.  He is not probably getting as much exercise as I would like.  He walks somewhat slowly.  He tries to watch his diet.  He has had no weight loss but has had no weight gain.  He denies any chest pressure, neck or arm discomfort.  Arm discomfort was his previous angina.  He has had no new palpitations, presyncope or syncope.  He has had no new shortness of breath, PND or orthopnea.  ROS: As stated in the HPI and negative for all other systems.  Studies Reviewed:    EKG:   EKG Interpretation Date/Time:  Monday April 21 2023 12:04:21 EDT Ventricular Rate:  51 PR Interval:  150 QRS Duration:  94 QT Interval:  428 QTC Calculation: 394 R Axis:   75  Text Interpretation: Sinus bradycardia Poor anterior R wave progression When compared with ECG of 18-May-2015 06:44, Vent. rate has decreased BY  28 BPM Criteria for Inferior infarct are no longer Present QT has shortened Confirmed by Rollene Rotunda ([id number]) on 04/21/2023 12:40:10 PM    Risk Assessment/Calculations:              Physical Exam:   VS:  BP 118/64 (BP Location: Left Arm, Patient Position: Sitting, Cuff Size: Normal)   Pulse (!) 51   Ht 5\' 6"  (1.676 m)   Wt 208 lb 3.2 oz (94.4 kg)   SpO2 96%   BMI 33.60 kg/m    Wt Readings from Last 3 Encounters:  04/21/23 208 lb 3.2 oz (94.4 kg)  06/26/22 208 lb 6.4 oz (94.5 kg)  06/05/22 208 lb 6.4 oz (94.5 kg)     GEN: Well  nourished, well developed in no acute distress NECK: No JVD; No carotid bruits CARDIAC: RRR, no murmurs, rubs, gallops RESPIRATORY:  Clear to auscultation without rales, wheezing or rhonchi  ABDOMEN: Soft, non-tender, non-distended EXTREMITIES:  No edema; No deformity   ASSESSMENT AND PLAN:   CAD:  The patient has no new sypmtoms.  No further cardiovascular testing is indicated.  We will continue with aggressive risk reduction and meds as listed.  DYSLIPIDEMIA:    His labs are followed by his primary provider and I have suggested an LDL in the 50s.  We also talked about diet.     Follow up with me in one year.   Signed, Rollene Rotunda, MD

## 2023-04-21 ENCOUNTER — Ambulatory Visit: Payer: Medicare Other | Attending: Cardiology | Admitting: Cardiology

## 2023-04-21 ENCOUNTER — Encounter: Payer: Self-pay | Admitting: Cardiology

## 2023-04-21 VITALS — BP 118/64 | HR 51 | Ht 66.0 in | Wt 208.2 lb

## 2023-04-21 DIAGNOSIS — I2510 Atherosclerotic heart disease of native coronary artery without angina pectoris: Secondary | ICD-10-CM

## 2023-04-21 DIAGNOSIS — E785 Hyperlipidemia, unspecified: Secondary | ICD-10-CM | POA: Diagnosis not present

## 2023-04-21 NOTE — Patient Instructions (Signed)
Medication Instructions:  Your physician recommends that you continue on your current medications as directed. Please refer to the Current Medication list given to you today.  *If you need a refill on your cardiac medications before your next appointment, please call your pharmacy*  Follow-Up: At [facility], you and your health needs are our priority.  As part of our continuing mission to provide you with exceptional heart care, we have created designated Provider Care Teams.  These Care Teams include your primary Cardiologist (physician) and Advanced Practice Providers (APPs -  Physician Assistants and Nurse Practitioners) who all work together to provide you with the care you need, when you need it.  We recommend signing up for the patient portal called "MyChart".  Sign up information is provided on this After Visit Summary.  MyChart is used to connect with patients for Virtual Visits (Telemedicine).  Patients are able to view lab/test results, encounter notes, upcoming appointments, etc.  Non-urgent messages can be sent to your provider as well.   To learn more about what you can do with MyChart, go to [email or web address].    Your next appointment:   12 month(s)  Provider:   James Hochrein, MD     

## 2023-06-09 ENCOUNTER — Ambulatory Visit (AMBULATORY_SURGERY_CENTER): Payer: Medicare Other

## 2023-06-09 ENCOUNTER — Encounter: Payer: Self-pay | Admitting: Internal Medicine

## 2023-06-09 VITALS — Ht 66.0 in | Wt 208.0 lb

## 2023-06-09 DIAGNOSIS — Z86010 Personal history of colonic polyps: Secondary | ICD-10-CM

## 2023-06-09 MED ORDER — NA SULFATE-K SULFATE-MG SULF 17.5-3.13-1.6 GM/177ML PO SOLN
1.0000 | Freq: Once | ORAL | 0 refills | Status: AC
Start: 2023-06-09 — End: 2023-06-09

## 2023-06-09 NOTE — Progress Notes (Signed)

## 2023-07-02 ENCOUNTER — Ambulatory Visit (AMBULATORY_SURGERY_CENTER): Payer: Medicare Other | Admitting: Internal Medicine

## 2023-07-02 ENCOUNTER — Encounter: Payer: Self-pay | Admitting: Internal Medicine

## 2023-07-02 VITALS — BP 120/81 | HR 60 | Temp 97.3°F | Resp 17 | Ht 66.0 in | Wt 208.0 lb

## 2023-07-02 DIAGNOSIS — Z1211 Encounter for screening for malignant neoplasm of colon: Secondary | ICD-10-CM | POA: Diagnosis present

## 2023-07-02 DIAGNOSIS — Z860101 Personal history of adenomatous and serrated colon polyps: Secondary | ICD-10-CM | POA: Diagnosis present

## 2023-07-02 DIAGNOSIS — D124 Benign neoplasm of descending colon: Secondary | ICD-10-CM

## 2023-07-02 DIAGNOSIS — K635 Polyp of colon: Secondary | ICD-10-CM | POA: Diagnosis not present

## 2023-07-02 DIAGNOSIS — D123 Benign neoplasm of transverse colon: Secondary | ICD-10-CM

## 2023-07-02 DIAGNOSIS — Z860100 Personal history of colon polyps, unspecified: Secondary | ICD-10-CM

## 2023-07-02 DIAGNOSIS — D122 Benign neoplasm of ascending colon: Secondary | ICD-10-CM | POA: Diagnosis not present

## 2023-07-02 MED ORDER — SODIUM CHLORIDE 0.9 % IV SOLN: 500.0000 mL | Freq: Once | INTRAVENOUS | Status: DC

## 2023-07-02 NOTE — Op Note (Signed)
[facility] Patient Name: Randy Peterson Procedure Date: 07/02/2023 10:41 AM MRN: [id number] Endoscopist: Beverley Fiedler , MD, [id number] Age: 77 Referring MD:  Date of Birth: 06-02-1946 Gender: Male Account #: [id number] Procedure:                Colonoscopy Indications:              High risk colon cancer surveillance: Personal                            history of multiple adenomas, Last colonoscopy:                            October 2023 (16 adenomas) Medicines:                Monitored Anesthesia Care Procedure:                Pre-Anesthesia Assessment:                           - Prior to the procedure, a History and Physical                            was performed, and patient medications and                            allergies were reviewed. The patient's tolerance of                            previous anesthesia was also reviewed. The risks                            and benefits of the procedure and the sedation                            options and risks were discussed with the patient.                            All questions were answered, and informed consent                            was obtained. Prior Anticoagulants: The patient has                            taken no anticoagulant or antiplatelet agents. ASA                            Grade Assessment: III - A patient with severe                            systemic disease. After reviewing the risks and                            benefits, the patient was deemed in satisfactory  condition to undergo the procedure.                           After obtaining informed consent, the colonoscope                            was passed under direct vision. Throughout the                            procedure, the patient's blood pressure, pulse, and                            oxygen saturations were monitored continuously. The                            Olympus Scope SN: [id number] was  introduced through                            the anus and advanced to the cecum, identified by                            appendiceal orifice and ileocecal valve. The                            colonoscopy was performed without difficulty. The                            patient tolerated the procedure well. The quality                            of the bowel preparation was good. The ileocecal                            valve, appendiceal orifice, and rectum were                            photographed. Scope In: 10:47:30 AM Scope Out: 11:03:05 AM Total Procedure Duration: 0 hours 15 minutes 35 seconds  Findings:                 The digital rectal exam was normal.                           Two sessile polyps were found in the ascending                            colon. The polyps were 1 to 2 mm in size. These                            polyps were removed with a cold snare. Resection                            and retrieval were complete.  A 5 mm polyp was found in the transverse colon. The                            polyp was sessile. The polyp was removed with a                            cold snare. Resection and retrieval were complete.                           Two sessile polyps were found in the descending                            colon. The polyps were 4 to 5 mm in size. These                            polyps were removed with a cold snare. Resection                            and retrieval were complete.                           Many large-mouthed, medium-mouthed and                            small-mouthed diverticula were found from ascending                            colon to sigmoid colon.                           Internal hemorrhoids were found during                            retroflexion. The hemorrhoids were medium-sized. Complications:            No immediate complications. Estimated Blood Loss:     Estimated blood loss was  minimal. Impression:               - Two 1 to 2 mm polyps in the ascending colon,                            removed with a cold snare. Resected and retrieved.                           - One 5 mm polyp in the transverse colon, removed                            with a cold snare. Resected and retrieved.                           - Two 4 to 5 mm polyps in the descending colon,                            removed  with a cold snare. Resected and retrieved.                           - Severe diverticulosis from ascending colon to                            sigmoid colon.                           - Internal hemorrhoids. Recommendation:           - Patient has a contact number available for                            emergencies. The signs and symptoms of potential                            delayed complications were discussed with the                            patient. Return to normal activities tomorrow.                            Written discharge instructions were provided to the                            patient.                           - Resume previous diet.                           - Continue present medications.                           - Await pathology results.                           - Repeat colonoscopy in 3 years for surveillance. Beverley Fiedler, MD 07/02/2023 11:06:10 AM This report has been signed electronically.

## 2023-07-02 NOTE — Patient Instructions (Signed)
Discharge instructions given. Handouts on polyps,Diverticulosis and Hemorrhoids. Resume previous medications. YOU HAD AN ENDOSCOPIC PROCEDURE TODAY AT THE [facility]:   Refer to the procedure report that was given to you for any specific questions about what was found during the examination.  If the procedure report does not answer your questions, please call your gastroenterologist to clarify.  If you requested that your care partner not be given the details of your procedure findings, then the procedure report has been included in a sealed envelope for you to review at your convenience later.  YOU SHOULD EXPECT: Some feelings of bloating in the abdomen. Passage of more gas than usual.  Walking can help get rid of the air that was put into your GI tract during the procedure and reduce the bloating. If you had a lower endoscopy (such as a colonoscopy or flexible sigmoidoscopy) you may notice spotting of blood in your stool or on the toilet paper. If you underwent a bowel prep for your procedure, you may not have a normal bowel movement for a few days.  Please Note:  You might notice some irritation and congestion in your nose or some drainage.  This is from the oxygen used during your procedure.  There is no need for concern and it should clear up in a day or so.  SYMPTOMS TO REPORT IMMEDIATELY:  Following lower endoscopy (colonoscopy or flexible sigmoidoscopy):  Excessive amounts of blood in the stool  Significant tenderness or worsening of abdominal pains  Swelling of the abdomen that is new, acute  Fever of 100F or higher   For urgent or emergent issues, a gastroenterologist can be reached at any hour by calling [phone]. Do not use MyChart messaging for urgent concerns.    DIET:  We do recommend a small meal at first, but then you may proceed to your regular diet.  Drink plenty of fluids but you should avoid alcoholic beverages for 24 hours.  ACTIVITY:  You should  plan to take it easy for the rest of today and you should NOT DRIVE or use heavy machinery until tomorrow (because of the sedation medicines used during the test).    FOLLOW UP: Our staff will call the number listed on your records the next business day following your procedure.  We will call around 7:15- 8:00 am to check on you and address any questions or concerns that you may have regarding the information given to you following your procedure. If we do not reach you, we will leave a message.     If any biopsies were taken you will be contacted by phone or by letter within the next 1-3 weeks.  Please call us at [phone] if you have not heard about the biopsies in 3 weeks.    SIGNATURES/CONFIDENTIALITY: You and/or your care partner have signed paperwork which will be entered into your electronic medical record.  These signatures attest to the fact that that the information above on your After Visit Summary has been reviewed and is understood.  Full responsibility of the confidentiality of this discharge information lies with you and/or your care-partner.

## 2023-07-02 NOTE — Progress Notes (Signed)
Uneventful anesthetic. Report to pacu rn. Vss. Care resumed by rn. 

## 2023-07-02 NOTE — Progress Notes (Signed)
Called to room to assist during endoscopic procedure.  Patient ID and intended procedure confirmed with present staff. Received instructions for my participation in the procedure from the performing physician.  

## 2023-07-02 NOTE — Progress Notes (Signed)
GASTROENTEROLOGY PROCEDURE H&P NOTE   Primary Care Physician: Mosetta Putt, MD    Reason for Procedure:  History of multiple adenomatous colon polyps  Plan:    Colonoscopy  Patient is appropriate for endoscopic procedure(s) in the ambulatory ([facility]) setting.  The nature of the procedure, as well as the risks, benefits, and alternatives were carefully and thoroughly reviewed with the patient. Ample time for discussion and questions allowed. The patient understood, was satisfied, and agreed to proceed.     HPI: Randy Peterson is a 77 y.o. male who presents for surveillance colonoscopy.  Medical history as below.  Tolerated the prep.  No recent chest pain or shortness of breath.  No abdominal pain today.  Past Medical History:  Diagnosis Date   Acid reflux    Acute MI, inferior wall, initial episode of care (HCC) 05/17/15   Angina decubitus (HCC) 05/16/15   Anxiety    Arthritis    both shoulders and back   CAD in native artery    per cath 05/16/14   Depression    Hyperlipidemia LDL goal <70    Prostate enlargement    S/P coronary artery stent placement, DES promus premier, RCA 05/17/15 05/18/2015    Past Surgical History:  Procedure Laterality Date   CARDIAC CATHETERIZATION N/A 05/17/2015   Procedure: Left Heart Cath and Coronary Angiography;  Surgeon: Tonny Bollman, MD;  Location: [facility] INVASIVE CV LAB;  Service: Cardiovascular;  Laterality: N/A;   CARDIAC CATHETERIZATION N/A 05/17/2015   Procedure: Coronary Stent Intervention;  Surgeon: Tonny Bollman, MD;  Location: [facility] INVASIVE CV LAB;  Service: Cardiovascular;  Laterality: N/A;   COLONOSCOPY     LUMBAR LAMINECTOMY/DECOMPRESSION MICRODISCECTOMY Bilateral 01/24/2020   Procedure: Laminectomy and Foraminotomy - bilateral - Lumbar Three-Four, Lumbar Four-Five.;  Surgeon: Donalee Citrin, MD;  Location: [facility] OR;  Service: Neurosurgery;  Laterality: Bilateral;  posterior   None     TOTAL HIP ARTHROPLASTY Right 10/13/2020   Procedure:  RIGHT TOTAL HIP ARTHROPLASTY ANTERIOR APPROACH;  Surgeon: Kathryne Hitch, MD;  Location: [facility] ORS;  Service: Orthopedics;  Laterality: Right;  3E bed    Prior to Admission medications   Medication Sig Start Date End Date Taking? Authorizing Provider  aspirin EC 81 MG tablet Take 81 mg by mouth daily. Swallow whole.   Yes [provider]  atorvastatin (LIPITOR) 80 MG tablet Take 1 tablet (80 mg total) by mouth daily at 6 PM. 05/18/15  Yes Leone Brand, NP  buPROPion (WELLBUTRIN XL) 150 MG 24 hr tablet Take 1 tablet (150 mg total) by mouth daily. 03/26/23  Yes Arfeen, Phillips Grout, MD  cholecalciferol (VITAMIN D) 1000 UNITS tablet Take 1,000 Units by mouth daily.   Yes [provider]  DULoxetine (CYMBALTA) 20 MG capsule Take 1 capsule (20 mg total) by mouth daily. 03/26/23  Yes Arfeen, Phillips Grout, MD  ezetimibe (ZETIA) 10 MG tablet Take 10 mg by mouth every evening.   Yes [provider]  finasteride (PROSCAR) 5 MG tablet Take 5 mg by mouth every evening.    Yes [provider]  hyoscyamine (ANASPAZ) 0.125 MG TBDP disintergrating tablet Place 0.125 mg under the tongue every 6 (six) hours as needed. 05/27/23  Yes [provider]  pantoprazole (PROTONIX) 40 MG tablet Take 40 mg by mouth daily. 11/13/16  Yes [provider]  RABEprazole (ACIPHEX) 20 MG tablet Take 20 mg by mouth daily.   Yes [provider]  tamsulosin (FLOMAX) 0.4 MG CAPS capsule Take 0.4  mg by mouth every evening.    Yes [provider]  acetaminophen (TYLENOL) 325 MG tablet Take 2 tablets (650 mg total) by mouth every 4 (four) hours as needed for headache or mild pain. 05/18/15   Leone Brand, NP  nitroGLYCERIN (NITROSTAT) 0.4 MG SL tablet Place 1 tablet (0.4 mg total) under the tongue every 5 (five) minutes as needed for chest pain. 04/24/21   Rollene Rotunda, MD  XIIDRA 5 % SOLN Place 1 drop into both eyes daily. Patient not taking: Reported on 06/09/2023 04/07/20    [provider]    Current Outpatient Medications  Medication Sig Dispense Refill   aspirin EC 81 MG tablet Take 81 mg by mouth daily. Swallow whole.     atorvastatin (LIPITOR) 80 MG tablet Take 1 tablet (80 mg total) by mouth daily at 6 PM. 30 tablet 6   buPROPion (WELLBUTRIN XL) 150 MG 24 hr tablet Take 1 tablet (150 mg total) by mouth daily. 90 tablet 1   cholecalciferol (VITAMIN D) 1000 UNITS tablet Take 1,000 Units by mouth daily.     DULoxetine (CYMBALTA) 20 MG capsule Take 1 capsule (20 mg total) by mouth daily. 90 capsule 1   ezetimibe (ZETIA) 10 MG tablet Take 10 mg by mouth every evening.     finasteride (PROSCAR) 5 MG tablet Take 5 mg by mouth every evening.      hyoscyamine (ANASPAZ) 0.125 MG TBDP disintergrating tablet Place 0.125 mg under the tongue every 6 (six) hours as needed.     pantoprazole (PROTONIX) 40 MG tablet Take 40 mg by mouth daily.     RABEprazole (ACIPHEX) 20 MG tablet Take 20 mg by mouth daily.     tamsulosin (FLOMAX) 0.4 MG CAPS capsule Take 0.4 mg by mouth every evening.      acetaminophen (TYLENOL) 325 MG tablet Take 2 tablets (650 mg total) by mouth every 4 (four) hours as needed for headache or mild pain.     nitroGLYCERIN (NITROSTAT) 0.4 MG SL tablet Place 1 tablet (0.4 mg total) under the tongue every 5 (five) minutes as needed for chest pain. 25 tablet 1   XIIDRA 5 % SOLN Place 1 drop into both eyes daily. (Patient not taking: Reported on 06/09/2023)     Current Facility-Administered Medications  Medication Dose Route Frequency Provider Last Rate Last Admin   0.9 %  sodium chloride infusion  500 mL Intravenous Once Keddrick Wyne, Carie Caddy, MD        Allergies as of 07/02/2023   (No Known Allergies)    Family History  Problem Relation Age of Onset   Dementia Mother    Stroke Mother    Heart attack Father 20   Suicidality Brother    Depression Brother    Heart attack Brother 37   Hypertension Neg Hx    Colon cancer Neg Hx    Colon polyps Neg  Hx    Crohn's disease Neg Hx    Esophageal cancer Neg Hx    Rectal cancer Neg Hx    Stomach cancer Neg Hx    Ulcerative colitis Neg Hx     Social History   Socioeconomic History   Marital status: Married    Spouse name: Not on file   Number of children: 1   Years of education: Not on file   Highest education level: Not on file  Occupational History   Occupation: RETIRED    Comment: [employer]  Tobacco Use   Smoking  status: Former    Current packs/day: 0.25    Average packs/day: 0.3 packs/day for 40.0 years (10.0 ttl pk-yrs)    Types: Cigarettes    Passive exposure: Never   Smokeless tobacco: Never   Tobacco comments:    Quit 20 years ago  Vaping Use   Vaping status: Never Used  Substance and Sexual Activity   Alcohol use: Yes    Alcohol/week: 2.0 standard drinks of alcohol    Types: 2 Cans of beer per week    Comment: 1-2 X WEEK   Drug use: No   Sexual activity: Yes    Partners: Female    Birth control/protection: None  Other Topics Concern   Not on file  Social History Narrative   Retired Librarian, academic   Social Determinants of Corporate investment banker Strain: Not on file  Food Insecurity: Not on file  Transportation Needs: Not on file  Physical Activity: Not on file  Stress: Not on file  Social Connections: Not on file  Intimate Partner Violence: Not on file    Physical Exam: Vital signs in last 24 hours: @BP  (!) 168/87   Pulse 61   Temp (!) 97.3 F (36.3 C) (Temporal)   Ht 5\' 6"  (1.676 m)   Wt 208 lb (94.3 kg)   SpO2 96%   BMI 33.57 kg/m  GEN: NAD EYE: Sclerae anicteric ENT: MMM CV: Non-tachycardic Pulm: CTA b/l GI: Soft, NT/ND NEURO:  Alert & Oriented x 3   Erick Blinks, MD [facility]  07/02/2023 10:41 AM

## 2023-07-02 NOTE — Progress Notes (Signed)
Pt's states no medical or surgical changes since previsit or office visit. 

## 2023-07-03 ENCOUNTER — Telehealth: Payer: Self-pay

## 2023-07-03 NOTE — Telephone Encounter (Signed)
  Follow up Call-     07/02/2023   10:06 AM 06/26/2022    7:36 AM  Call back number  Post procedure Call Back phone  # [phone]  Permission to leave phone message Yes Yes     Patient questions:  Do you have a fever, pain , or abdominal swelling? No. Pain Score  0 *  Have you tolerated food without any problems? Yes.    Have you been able to return to your normal activities? Yes.    Do you have any questions about your discharge instructions: Diet   No. Medications  No. Follow up visit  No.  Do you have questions or concerns about your Care? No.  Actions: * If pain score is 4 or above: No action needed, pain <4.

## 2023-07-04 LAB — SURGICAL PATHOLOGY

## 2023-07-08 ENCOUNTER — Encounter: Payer: Self-pay | Admitting: Internal Medicine

## 2023-08-25 LAB — LAB REPORT - SCANNED
A1c: 5.9
EGFR: 65
PSA, Total: 0.076

## 2023-09-26 ENCOUNTER — Encounter (HOSPITAL_COMMUNITY): Payer: Self-pay | Admitting: Psychiatry

## 2023-09-26 ENCOUNTER — Telehealth (HOSPITAL_BASED_OUTPATIENT_CLINIC_OR_DEPARTMENT_OTHER): Payer: Medicare Other | Admitting: Psychiatry

## 2023-09-26 VITALS — Wt 208.0 lb

## 2023-09-26 DIAGNOSIS — F419 Anxiety disorder, unspecified: Secondary | ICD-10-CM | POA: Diagnosis not present

## 2023-09-26 DIAGNOSIS — F330 Major depressive disorder, recurrent, mild: Secondary | ICD-10-CM | POA: Diagnosis not present

## 2023-09-26 MED ORDER — BUPROPION HCL ER (XL) 150 MG PO TB24
150.0000 mg | ORAL_TABLET | Freq: Every day | ORAL | 1 refills | Status: AC
Start: 2023-09-26 — End: ?

## 2023-09-26 MED ORDER — DULOXETINE HCL 20 MG PO CPEP
20.0000 mg | ORAL_CAPSULE | Freq: Every day | ORAL | 1 refills | Status: AC
Start: 2023-09-26 — End: ?

## 2023-09-26 NOTE — Progress Notes (Signed)
 [facility] MD Virtual Progress Note   Patient Location: Home Provider Location: Home Office  I connect with patient by telephone and verified that I am speaking with correct person by using two identifiers. I discussed the limitations of evaluation and management by telemedicine and the availability of in person appointments. I also discussed with the patient that there may be a patient responsible charge related to this service. The patient expressed understanding and agreed to proceed.  Randy Peterson [id number] 78 y.o.  09/26/2023 10:31 AM  History of Present Illness:  Patient is evaluated by phone session.  He is doing well on his medication.  He is excited as fast grandchild who is now 97 months old.  He had a good time with the family on Christmas.  Patient told he see grandchild almost every day because they have 5 minutes away.  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  He denies any major panic attack, crying spells or any feeling of hopelessness or worthlessness.  He recently had a visit with his primary care physician Dr. Brynn.  No new medication added.  His appetite is okay.  His weight is stable.  Denies drinking or using any illegal substances.  He has no tremor or shakes or any EPS.  Like to keep his Cymbalta  and Wellbutrin .    Past Psychiatric History: H/O inpatient in 2008 for suicidal attempt and cut wrist after he lost the job. Took Paxil, Risperdal and given Vyvanse  for attention and focus.  Took Xanax  for a while until does not needed.  Cymbalta  and Wellbutrin  helped.        Outpatient Encounter Medications as of 09/26/2023  Medication Sig   acetaminophen  (TYLENOL ) 325 MG tablet Take 2 tablets (650 mg total) by mouth every 4 (four) hours as needed for headache or mild pain.   aspirin  EC 81 MG tablet Take 81 mg by mouth daily. Swallow whole.   atorvastatin  (LIPITOR ) 80 MG tablet Take 1 tablet (80 mg total) by  mouth daily at 6 PM.   buPROPion  (WELLBUTRIN  XL) 150 MG 24 hr tablet Take 1 tablet (150 mg total) by mouth daily.   cholecalciferol  (VITAMIN D ) 1000 UNITS tablet Take 1,000 Units by mouth daily.   DULoxetine  (CYMBALTA ) 20 MG capsule Take 1 capsule (20 mg total) by mouth daily.   ezetimibe  (ZETIA ) 10 MG tablet Take 10 mg by mouth every evening.   finasteride  (PROSCAR ) 5 MG tablet Take 5 mg by mouth every evening.    hyoscyamine (ANASPAZ) 0.125 MG TBDP disintergrating tablet Place 0.125 mg under the tongue every 6 (six) hours as needed.   nitroGLYCERIN  (NITROSTAT ) 0.4 MG SL tablet Place 1 tablet (0.4 mg total) under the tongue every 5 (five) minutes as needed for chest pain.   pantoprazole  (PROTONIX ) 40 MG tablet Take 40 mg by mouth daily.   RABEprazole (ACIPHEX) 20 MG tablet Take 20 mg by mouth daily.   tamsulosin  (FLOMAX ) 0.4 MG CAPS capsule Take 0.4 mg by mouth every evening.    XIIDRA 5 % SOLN Place 1 drop into both eyes daily. (Patient not taking: Reported on 06/09/2023)   No facility-administered encounter medications on file as of 09/26/2023.    Recent Results (from the past 2160 hours)  Surgical pathology ([facility])     Status: None   Collection Time: 07/02/23 12:00 AM  Result Value Ref Range   SURGICAL PATHOLOGY      SURGICAL PATHOLOGY [facility] [address] [place], [place] [address] Telephone [phone] or [phone] Fax [phone]  REPORT OF SURGICAL PATHOLOGY   Accession #: [id number] Patient Name: Randy Peterson Visit # : [id number]  MRN: [id number] Physician: Albertus Gordy HERO. DOB/Age [birth year] (Age: 10) Gender: M Collected Date: 07/02/2023 Received Date: 07/03/2023  FINAL DIAGNOSIS       1. Surgical [P], colon, ascending x 2, transverse x 1, polyp (3) :       TUBULAR ADENOMA, 2 FRAGMENTS      NEGATIVE FOR HIGH-GRADE DYSPLASIA AND CARCINOMA       2. Surgical [P], colon, descending, polyp (2) :       SESSILE  SERRATED POLYP WITHOUT CYTOLOGIC DYSPLASIA      TUBULAR ADENOMA      NEGATIVE FOR HIGH-GRADE DYSPLASIA AND CARCINOMA       ELECTRONIC SIGNATURE : Picklesimer Md, Fred , Sports Administrator, International Aid/development Worker  MICROSCOPIC DESCRIPTION  CASE COMMENTS STAINS USED IN DIAGNOSIS: H&E H&E    CLINICAL HISTORY  SPECI MEN(S) OBTAINED 1. Surgical [P], Colon, Ascending X 2, Transverse X 1, Polyp (3) 2. Surgical [P], Colon, Descending, Polyp (2)  SPECIMEN COMMENTS: 1. Hx of colonic polyps; benign neoplasm of ascending colon; benign neoplasm of transverse colon; benign neoplasm of descending colon SPECIMEN CLINICAL INFORMATION: 1. R/O adenoma 2. R/O adenoma    Gross Description 1. Received in formalin are tan, soft tissue fragments that are submitted in toto.Number: 3, Size: 0.3 cm smallest to 0.6 cm largest, (1B) ( TA ) 2. Received in formalin are tan, soft tissue fragments that are submitted in toto.Number: 2, Size: 0.4 cm smallest to 0.6 cm largest, (1B) ( TA )        Report signed out from the following location(s) [place] [address] [place], [place] [address] CLIA #: [id number]  [facility] [address] [place], [place] [address] CLIA #: [id number]   Lab report - scanned     Status: None   Collection Time: 08/25/23  4:33 PM  Result Value Ref Range   PSA, Total 0.076    EGFR 65.0    A1c 5.9     Comment: ABSTRACTED BY HIM     Psychiatric Specialty Exam: Physical Exam  Review of Systems  Weight 208 lb (94.3 kg).There is no height or weight on file to calculate BMI.  General Appearance: NA  Eye Contact:  NA  Speech:  Clear and Coherent and Normal Rate  Volume:  Normal  Mood:  Euthymic  Affect:  NA  Thought Process:  Goal Directed  Orientation:  Full (Time, Place, and Person)  Thought Content:  Logical  Suicidal Thoughts:  No  Homicidal Thoughts:  No  Memory:  Immediate;   Good Recent;   Good Remote;   Good   Judgement:  Good  Insight:  Good  Psychomotor Activity:  NA  Concentration:  Concentration: Good and Attention Span: Good  Recall:  Good  Fund of Knowledge:  Good  Language:  Good  Akathisia:  No  Handed:  Right  AIMS (if indicated):     Assets:  Communication Skills Desire for Improvement Housing Resilience Social Support Transportation  ADL's:  Intact  Cognition:  WNL  Sleep:  ok     Assessment/Plan: Major depressive disorder, recurrent episode, mild (HCC) - Plan: DULoxetine  (CYMBALTA ) 20 MG capsule, buPROPion  (WELLBUTRIN  XL) 150 MG 24 hr tablet  Anxiety - Plan: DULoxetine  (CYMBALTA ) 20 MG capsule, buPROPion  (  WELLBUTRIN  XL) 150 MG 24 hr tablet  Patient is stable on low-dose Cymbalta  and Wellbutrin .  We have tried to cut down the medication in the past but his symptoms come back.  He does not want to change or reduce the medication.  He feel his symptoms are much stable.  Will continue Cymbalta  20 mg daily and Wellbutrin  XL 150 mg in the morning.  I encouraged to have next appointment in person.  Patient agreed with the plan.  We will follow-up in 6 months.   Follow Up Instructions:     I discussed the assessment and treatment plan with the patient. The patient was provided an opportunity to ask questions and all were answered. The patient agreed with the plan and demonstrated an understanding of the instructions.   The patient was advised to call back or seek an in-person evaluation if the symptoms worsen or if the condition fails to improve as anticipated.    Collaboration of Care: Other provider involved in patient's care AEB notes are available in epic to review  Patient/Guardian was advised Release of Information must be obtained prior to any record release in order to collaborate their care with an outside provider. Patient/Guardian was advised if they have not already done so to contact the registration department to sign all necessary forms in order for us  to  release information regarding their care.   Consent: Patient/Guardian gives verbal consent for treatment and assignment of benefits for services provided during this visit. Patient/Guardian expressed understanding and agreed to proceed.     I provided 15 minutes of non face to face time during this encounter.  Note: This document was prepared by [employer] voice dictation technology and any errors that results from this process are unintentional.    Leni ONEIDA Client, MD 09/26/2023

## 2024-03-01 ENCOUNTER — Other Ambulatory Visit (HOSPITAL_COMMUNITY): Payer: Self-pay

## 2024-03-01 DIAGNOSIS — F419 Anxiety disorder, unspecified: Secondary | ICD-10-CM

## 2024-03-01 DIAGNOSIS — F330 Major depressive disorder, recurrent, mild: Secondary | ICD-10-CM

## 2024-03-01 MED ORDER — BUPROPION HCL ER (XL) 150 MG PO TB24: 150.0000 mg | ORAL_TABLET | Freq: Every day | ORAL | 0 refills | Status: DC

## 2024-03-01 MED ORDER — DULOXETINE HCL 20 MG PO CPEP: 20.0000 mg | ORAL_CAPSULE | Freq: Every day | ORAL | 0 refills | Status: DC

## 2024-03-18 ENCOUNTER — Ambulatory Visit (HOSPITAL_COMMUNITY): Admitting: Psychiatry

## 2024-03-22 ENCOUNTER — Telehealth (HOSPITAL_COMMUNITY): Admitting: Psychiatry

## 2024-03-22 ENCOUNTER — Encounter (HOSPITAL_COMMUNITY): Payer: Self-pay | Admitting: Psychiatry

## 2024-03-22 DIAGNOSIS — F419 Anxiety disorder, unspecified: Secondary | ICD-10-CM | POA: Diagnosis not present

## 2024-03-22 DIAGNOSIS — F330 Major depressive disorder, recurrent, mild: Secondary | ICD-10-CM | POA: Diagnosis not present

## 2024-03-22 MED ORDER — DULOXETINE HCL 20 MG PO CPEP: 20.0000 mg | ORAL_CAPSULE | Freq: Every day | ORAL | 1 refills | Status: DC

## 2024-03-22 MED ORDER — BUPROPION HCL ER (XL) 150 MG PO TB24: 150.0000 mg | ORAL_TABLET | Freq: Every day | ORAL | 1 refills | Status: DC

## 2024-03-22 NOTE — Progress Notes (Signed)
 [facility] MD Virtual Progress Note   Patient Location: [facility]  Provider Location: Home Office  I connect with patient by video and verified that I am speaking with correct person by using two identifiers. I discussed the limitations of evaluation and management by telemedicine and the availability of in person appointments. I also discussed with the patient that there may be a patient responsible charge related to this service. The patient expressed understanding and agreed to proceed.  Randy Peterson [id number] 78 y.o.  03/22/2024 10:24 AM  History of Present Illness:  Patient is evaluated by video session.  [place] the [place] with [place] and grandkids.  He reported things are going very well.  He is taking Cymbalta  and Wellbutrin  is keeping his mood and depression is stable.  He is sleeping good.  He denies any irritability, anger, feeling of hopelessness or worthlessness.  His grandchild is now 69-year-old.  Patient reported sleep very well and denies any panic attack, nervousness or feeling overwhelmed.  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  His appetite is okay.  His weight is stable.  He has a physical in December and no new medication added by his PCP.  His next appointment again in December 2025.  He has no tremors, shakes or any EPS.  Like to keep the Cymbalta  and Wellbutrin .  Past Psychiatric History: H/O inpatient in 2008 for suicidal attempt and cut wrist after he lost the job. Took Paxil, Risperdal and given Vyvanse  for attention and focus.  Took Xanax  for a while until does not needed.  Cymbalta  and Wellbutrin  helped.       Outpatient Encounter Medications as of 03/22/2024  Medication Sig   acetaminophen  (TYLENOL ) 325 MG tablet Take 2 tablets (650 mg total) by mouth every 4 (four) hours as needed for headache or mild pain.   aspirin  EC 81 MG tablet Take 81 mg by mouth daily. Swallow whole.   atorvastatin   (LIPITOR ) 80 MG tablet Take 1 tablet (80 mg total) by mouth daily at 6 PM.   buPROPion  (WELLBUTRIN  XL) 150 MG 24 hr tablet Take 1 tablet (150 mg total) by mouth daily.   cholecalciferol  (VITAMIN D ) 1000 UNITS tablet Take 1,000 Units by mouth daily.   DULoxetine  (CYMBALTA ) 20 MG capsule Take 1 capsule (20 mg total) by mouth daily.   ezetimibe  (ZETIA ) 10 MG tablet Take 10 mg by mouth every evening.   finasteride  (PROSCAR ) 5 MG tablet Take 5 mg by mouth every evening.    hyoscyamine (ANASPAZ) 0.125 MG TBDP disintergrating tablet Place 0.125 mg under the tongue every 6 (six) hours as needed.   nitroGLYCERIN  (NITROSTAT ) 0.4 MG SL tablet Place 1 tablet (0.4 mg total) under the tongue every 5 (five) minutes as needed for chest pain.   pantoprazole  (PROTONIX ) 40 MG tablet Take 40 mg by mouth daily.   RABEprazole (ACIPHEX) 20 MG tablet Take 20 mg by mouth daily.   tamsulosin  (FLOMAX ) 0.4 MG CAPS capsule Take 0.4 mg by mouth every evening.    XIIDRA 5 % SOLN Place 1 drop into both eyes daily. (Patient not taking: Reported on 06/09/2023)   No facility-administered encounter medications on file as of 03/22/2024.    No results found for this or any previous visit (from the past 2160 hours).   Psychiatric Specialty Exam: Physical Exam  Review of Systems  Weight 207 lb (93.9 kg).There is no height or weight on file to  calculate BMI.  General Appearance: Casual  Eye Contact:  Good  Speech:  Clear and Coherent and Normal Rate  Volume:  Normal  Mood:  Euthymic  Affect:  Appropriate  Thought Process:  Goal Directed  Orientation:  Full (Time, Place, and Person)  Thought Content:  Logical  Suicidal Thoughts:  No  Homicidal Thoughts:  No  Memory:  Immediate;   Good Recent;   Good Remote;   Good  Judgement:  Good  Insight:  Present  Psychomotor Activity:  Normal  Concentration:  Concentration: Good and Attention Span: Good  Recall:  Good  Fund of Knowledge:  Good  Language:  Good  Akathisia:  No   Handed:  Right  AIMS (if indicated):     Assets:  Communication Skills Desire for Improvement Housing Social Support Transportation  ADL's:  Intact  Cognition:  WNL  Sleep:  ok       09/15/2015   12:19 PM 06/12/2015   12:16 PM  Depression screen PHQ 2/9  Decreased Interest 0 0  Down, Depressed, Hopeless 0 0  PHQ - 2 Score 0 0    Assessment/Plan: Major depressive disorder, recurrent episode, mild (HCC) - Plan: DULoxetine  (CYMBALTA ) 20 MG capsule, buPROPion  (WELLBUTRIN  XL) 150 MG 24 hr tablet  Anxiety - Plan: DULoxetine  (CYMBALTA ) 20 MG capsule, buPROPion  (WELLBUTRIN  XL) 150 MG 24 hr tablet  Patient is stable on current medication.  He has no major concern or side effects.  He does not want to change the medication.  Continue Wellbutrin  XL 150 mg in the morning and Cymbalta  20 mg daily.  In the past we had tried to cut down to keep him on 1 medicine but his symptoms come back.  Patient does not want to change the medication.  Recommend to call us  back if is any question or any concern.  Follow-up in 6 months.   Follow Up Instructions:     I discussed the assessment and treatment plan with the patient. The patient was provided an opportunity to ask questions and all were answered. The patient agreed with the plan and demonstrated an understanding of the instructions.   The patient was advised to call back or seek an in-person evaluation if the symptoms worsen or if the condition fails to improve as anticipated.    Collaboration of Care: Other provider involved in patient's care AEB notes are available in epic to review  Patient/Guardian was advised Release of Information must be obtained prior to any record release in order to collaborate their care with an outside provider. Patient/Guardian was advised if they have not already done so to contact the registration department to sign all necessary forms in order for us  to release information regarding their care.   Consent:  Patient/Guardian gives verbal consent for treatment and assignment of benefits for services provided during this visit. Patient/Guardian expressed understanding and agreed to proceed.     Total encounter time 15 minutes which includes face-to-face time, chart reviewed, care coordination, order entry and documentation during this encounter.   Note: This document was prepared by [employer] voice dictation technology and any errors that results from this process are unintentional.    Leni ONEIDA Client, MD 03/22/2024

## 2024-03-25 ENCOUNTER — Ambulatory Visit (HOSPITAL_COMMUNITY): Payer: Medicare Other | Admitting: Psychiatry

## 2024-04-28 ENCOUNTER — Ambulatory Visit: Payer: Self-pay | Admitting: Podiatry

## 2024-04-28 DIAGNOSIS — M722 Plantar fascial fibromatosis: Secondary | ICD-10-CM

## 2024-04-28 NOTE — Progress Notes (Signed)
 Subjective:  Patient ID: Randy Peterson, male    DOB: 31-Jan-1946,  MRN: [id number]  Chief Complaint  Patient presents with   Plantar Fasciitis    78 y.o. male presents with the above complaint.  Patient presents with left heel pain that has been there for quite some time is progressive and worse worse with ambulation with pressure he states he went to the beach for few weeks and since then he started having this pain he has not seen MRIs prior to seeing me denies any other acute complaints would like to discuss treatment options for this.   Review of Systems: Negative except as noted in the HPI. Denies N/V/F/Ch.  Past Medical History:  Diagnosis Date   Acid reflux    Acute MI, inferior wall, initial episode of care [facility] 05/17/15   Angina decubitus (HCC) 05/16/15   Anxiety    Arthritis    both shoulders and back   CAD in native artery    per cath 05/16/14   Depression    Hyperlipidemia LDL goal <70    Prostate enlargement    S/P coronary artery stent placement, DES promus premier, RCA 05/17/15 05/18/2015    Current Outpatient Medications:    acetaminophen  (TYLENOL ) 325 MG tablet, Take 2 tablets (650 mg total) by mouth every 4 (four) hours as needed for headache or mild pain., Disp: , Rfl:    aspirin  EC 81 MG tablet, Take 81 mg by mouth daily. Swallow whole., Disp: , Rfl:    atorvastatin  (LIPITOR ) 80 MG tablet, Take 1 tablet (80 mg total) by mouth daily at 6 PM., Disp: 30 tablet, Rfl: 6   buPROPion  (WELLBUTRIN  XL) 150 MG 24 hr tablet, Take 1 tablet (150 mg total) by mouth daily., Disp: 90 tablet, Rfl: 1   cholecalciferol  (VITAMIN D ) 1000 UNITS tablet, Take 1,000 Units by mouth daily., Disp: , Rfl:    DULoxetine  (CYMBALTA ) 20 MG capsule, Take 1 capsule (20 mg total) by mouth daily., Disp: 90 capsule, Rfl: 1   ezetimibe  (ZETIA ) 10 MG tablet, Take 10 mg by mouth every evening., Disp: , Rfl:    finasteride  (PROSCAR ) 5 MG tablet, Take 5 mg by mouth every evening. , Disp: , Rfl:     hyoscyamine (ANASPAZ) 0.125 MG TBDP disintergrating tablet, Place 0.125 mg under the tongue every 6 (six) hours as needed., Disp: , Rfl:    nitroGLYCERIN  (NITROSTAT ) 0.4 MG SL tablet, Place 1 tablet (0.4 mg total) under the tongue every 5 (five) minutes as needed for chest pain., Disp: 25 tablet, Rfl: 1   pantoprazole  (PROTONIX ) 40 MG tablet, Take 40 mg by mouth daily., Disp: , Rfl:    RABEprazole (ACIPHEX) 20 MG tablet, Take 20 mg by mouth daily., Disp: , Rfl:    tamsulosin  (FLOMAX ) 0.4 MG CAPS capsule, Take 0.4 mg by mouth every evening. , Disp: , Rfl:    XIIDRA 5 % SOLN, Place 1 drop into both eyes daily. (Patient not taking: Reported on 06/09/2023), Disp: , Rfl:   Social History   Tobacco Use  Smoking Status Former   Current packs/day: 0.25   Average packs/day: 0.3 packs/day for 40.0 years (10.0 ttl pk-yrs)   Types: Cigarettes   Passive exposure: Never  Smokeless Tobacco Never  Tobacco Comments   Quit 20 years ago    No Known Allergies Objective:  There were no vitals filed for this visit. There is no height or weight on file to calculate BMI. Constitutional Well developed. Well nourished.  Vascular Dorsalis pedis  pulses palpable bilaterally. Posterior tibial pulses palpable bilaterally. Capillary refill normal to all digits.  No cyanosis or clubbing noted. Pedal hair growth normal.  Neurologic Normal speech. Oriented to person, place, and time. Epicritic sensation to light touch grossly present bilaterally.  Dermatologic Nails well groomed and normal in appearance. No open wounds. No skin lesions.  Orthopedic: Normal joint ROM without pain or crepitus bilaterally. No visible deformities. Tender to palpation at the calcaneal tuber left. No pain with calcaneal squeeze left. Ankle ROM diminished range of motion left. Silfverskiold Test: positive left.   Radiographs None  Assessment:   1. Plantar fasciitis, left    Plan:  Patient was evaluated and treated and all  questions answered.  Plantar Fasciitis, left - XR reviewed as above.  - Educated on icing and stretching. Instructions given.  - Injection delivered to the plantar fascia as below. - DME: Plantar fascial brace dispensed to support the medial longitudinal arch of the foot and offload pressure from the heel and prevent arch collapse during weightbearing - Pharmacologic management: None  Procedure: Injection Tendon/Ligament Location: Left plantar fascia at the glabrous junction; medial approach. Skin Prep: alcohol Injectate: 0.5 cc 0.5% marcaine  plain, 0.5 cc of 1% Lidocaine , 0.5 cc kenalog 10. Disposition: Patient tolerated procedure well. Injection site dressed with a band-aid.  No follow-ups on file.

## 2024-04-30 NOTE — Progress Notes (Unsigned)
  Cardiology Office Note:   Date:  05/03/2024  ID:  Randy Peterson, DOB 1946/08/23, MRN [id number] PCP: Windy Coy, MD  [facility] Providers Cardiologist:  Lynwood Schilling, MD {  History of Present Illness:   Randy Peterson is a 78 y.o. male  who presents for follow-up of inferior myocardial infarction. 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 Perfusion study in Oct 2019 was negative for ischemia.  He returns for follow up.     Since I last saw him he has done well.  The patient denies any new symptoms such as chest discomfort, neck or arm discomfort. There has been no new shortness of breath, PND or orthopnea. There have been no reported palpitations, presyncope or syncope.  He has a new condo with stairs and a condo at [employer] with paths.  He walks on these and climbs the stairs without cardiovascular symptoms.   ROS: As stated in the HPI and negative for all other systems.  Studies Reviewed:    EKG:   EKG Interpretation Date/Time:  Monday May 03 2024 11:05:48 EDT Ventricular Rate:  65 PR Interval:  132 QRS Duration:  88 QT Interval:  386 QTC Calculation: 401 R Axis:   7  Text Interpretation: Normal sinus rhythm When compared with ECG of 21-Apr-2023 12:04, No significant change since last tracing Confirmed by Schilling Lynwood ([id number]) on 05/03/2024 11:22:41 AM    Risk Assessment/Calculations:       Physical Exam:   VS:  BP 118/66   Pulse 65   Ht 5' 6 (1.676 m)   Wt 199 lb (90.3 kg)   SpO2 97%   BMI 32.12 kg/m    Wt Readings from Last 3 Encounters:  05/03/24 199 lb (90.3 kg)  07/02/23 208 lb (94.3 kg)  06/09/23 208 lb (94.3 kg)     GEN: Well nourished, well developed in no acute distress NECK: No JVD; No carotid bruits CARDIAC: RRR, no murmurs, rubs, gallops RESPIRATORY:  Clear to auscultation without rales, wheezing or rhonchi  ABDOMEN: Soft, non-tender, non-distended EXTREMITIES:  No edema; No deformity    ASSESSMENT AND PLAN:       CAD:  The patient has no new sypmtoms.  No further cardiovascular testing is indicated.  We will continue with aggressive risk reduction and meds as listed.   DYSLIPIDEMIA:    This is followed by Windy Coy, MD.  I will suggest to his primary care to get an LP(a) next time his labs are checked.  Goal LDL is in the 50s.  No change in therapy.   Follow up with me in 18 months.    Signed, Lynwood Schilling, MD

## 2024-05-03 ENCOUNTER — Encounter: Payer: Self-pay | Admitting: Cardiology

## 2024-05-03 ENCOUNTER — Ambulatory Visit: Attending: Cardiology | Admitting: Cardiology

## 2024-05-03 VITALS — BP 118/66 | HR 65 | Ht 66.0 in | Wt 199.0 lb

## 2024-05-03 DIAGNOSIS — I2510 Atherosclerotic heart disease of native coronary artery without angina pectoris: Secondary | ICD-10-CM

## 2024-05-03 DIAGNOSIS — E785 Hyperlipidemia, unspecified: Secondary | ICD-10-CM

## 2024-05-03 NOTE — Patient Instructions (Signed)
 Medication Instructions:  Your physician recommends that you continue on your current medications as directed. Please refer to the Current Medication list given to you today.  *If you need a refill on your cardiac medications before your next appointment, please call your pharmacy*  Lab Work: NONE If you have labs (blood work) drawn today and your tests are completely normal, you will receive your results only by: MyChart Message (if you have MyChart) OR A paper copy in the mail If you have any lab test that is abnormal or we need to change your treatment, we will call you to review the results.  Testing/Procedures: NONE  Follow-Up: At [facility], you and your health needs are our priority.  As part of our continuing mission to provide you with exceptional heart care, our providers are all part of one team.  This team includes your primary Cardiologist (physician) and Advanced Practice Providers or APPs (Physician Assistants and Nurse Practitioners) who all work together to provide you with the care you need, when you need it.  Your next appointment:   18 months  Provider:   [place], MD  We recommend signing up for the patient portal called MyChart.  Sign up information is provided on this After Visit Summary.  MyChart is used to connect with patients for Virtual Visits (Telemedicine).  Patients are able to view lab/test results, encounter notes, upcoming appointments, etc.  Non-urgent messages can be sent to your provider as well.   To learn more about what you can do with MyChart, go to [email or web address].

## 2024-05-28 ENCOUNTER — Ambulatory Visit: Admitting: Podiatry

## 2024-06-02 ENCOUNTER — Ambulatory Visit (INDEPENDENT_AMBULATORY_CARE_PROVIDER_SITE_OTHER): Admitting: Podiatry

## 2024-06-02 DIAGNOSIS — M722 Plantar fascial fibromatosis: Secondary | ICD-10-CM

## 2024-06-02 DIAGNOSIS — M62462 Contracture of muscle, left lower leg: Secondary | ICD-10-CM

## 2024-06-02 NOTE — Progress Notes (Signed)
 Subjective:  Patient ID: Randy Peterson, male    DOB: Oct 27, 1945,  MRN: [id number]  Chief Complaint  Patient presents with   Plantar Fasciitis    Pt stated that he is doing much better than he was he stated that he still has some discomfort he stated that the insoles has helped a lot and the brace is working well too.     78 y.o. male presents with the above complaint.  Patient presents for follow-up of left plantar fasciitis he states he is doing a lot better.  He states that there is some discomfort.  The insoles help a lot denies seeing anyone else prior to seeing me denies any other acute complaints.   Review of Systems: Negative except as noted in the HPI. Denies N/V/F/Ch.  Past Medical History:  Diagnosis Date   Acid reflux    Acute MI, inferior wall, initial episode of care [facility]) 05/17/15   Angina decubitus (HCC) 05/16/15   Anxiety    Arthritis    both shoulders and back   CAD in native artery    per cath 05/16/14   Depression    Hyperlipidemia LDL goal <70    Prostate enlargement    S/P coronary artery stent placement, DES promus premier, RCA 05/17/15 05/18/2015    Current Outpatient Medications:    tadalafil (CIALIS) 5 MG tablet, , Disp: , Rfl:    acetaminophen  (TYLENOL ) 325 MG tablet, Take 2 tablets (650 mg total) by mouth every 4 (four) hours as needed for headache or mild pain., Disp: , Rfl:    aspirin  EC 81 MG tablet, Take 81 mg by mouth daily. Swallow whole., Disp: , Rfl:    atorvastatin  (LIPITOR ) 80 MG tablet, Take 1 tablet (80 mg total) by mouth daily at 6 PM., Disp: 30 tablet, Rfl: 6   buPROPion  (WELLBUTRIN  XL) 150 MG 24 hr tablet, Take 1 tablet (150 mg total) by mouth daily., Disp: 90 tablet, Rfl: 1   cholecalciferol  (VITAMIN D ) 1000 UNITS tablet, Take 1,000 Units by mouth daily., Disp: , Rfl:    DULoxetine  (CYMBALTA ) 20 MG capsule, Take 1 capsule (20 mg total) by mouth daily., Disp: 90 capsule, Rfl: 1   ezetimibe  (ZETIA ) 10 MG tablet, Take 10 mg by mouth every  evening., Disp: , Rfl:    finasteride  (PROSCAR ) 5 MG tablet, Take 5 mg by mouth every evening. , Disp: , Rfl:    hyoscyamine (ANASPAZ) 0.125 MG TBDP disintergrating tablet, Place 0.125 mg under the tongue every 6 (six) hours as needed., Disp: , Rfl:    nitroGLYCERIN  (NITROSTAT ) 0.4 MG SL tablet, Place 1 tablet (0.4 mg total) under the tongue every 5 (five) minutes as needed for chest pain., Disp: 25 tablet, Rfl: 1   pantoprazole  (PROTONIX ) 40 MG tablet, Take 40 mg by mouth daily., Disp: , Rfl:    RABEprazole (ACIPHEX) 20 MG tablet, Take 20 mg by mouth daily., Disp: , Rfl:    tamsulosin  (FLOMAX ) 0.4 MG CAPS capsule, Take 0.4 mg by mouth every evening. , Disp: , Rfl:   Social History   Tobacco Use  Smoking Status Former   Current packs/day: 0.25   Average packs/day: 0.3 packs/day for 40.0 years (10.0 ttl pk-yrs)   Types: Cigarettes   Passive exposure: Never  Smokeless Tobacco Never  Tobacco Comments   Quit 20 years ago    No Known Allergies Objective:  There were no vitals filed for this visit. There is no height or weight on file to calculate BMI. Constitutional  Well developed. Well nourished.  Vascular Dorsalis pedis pulses palpable bilaterally. Posterior tibial pulses palpable bilaterally. Capillary refill normal to all digits.  No cyanosis or clubbing noted. Pedal hair growth normal.  Neurologic Normal speech. Oriented to person, place, and time. Epicritic sensation to light touch grossly present bilaterally.  Dermatologic Nails well groomed and normal in appearance. No open wounds. No skin lesions.  Orthopedic: Normal joint ROM without pain or crepitus bilaterally. No visible deformities. Tender to palpation at the calcaneal tuber left. No pain with calcaneal squeeze left. Ankle ROM diminished range of motion left. Silfverskiold Test: positive left.   Radiographs None  Assessment:   1. Plantar fasciitis, left   2. Gastrocnemius equinus of left lower extremity      Plan:  Patient was evaluated and treated and all questions answered.  Plantar Fasciitis, left with gastrocnemius equinus - XR reviewed as above.  - Educated on icing and stretching. Instructions given.  - Second injection delivered to the plantar fascia as below. - DME: Plantar fascial brace dispensed to support the medial longitudinal arch of the foot and offload pressure from the heel and prevent arch collapse during weightbearing - Pharmacologic management: None  Procedure: Injection Tendon/Ligament Location: Left plantar fascia at the glabrous junction; medial approach. Skin Prep: alcohol Injectate: 0.5 cc 0.5% marcaine  plain, 0.5 cc of 1% Lidocaine , 0.5 cc kenalog 10. Disposition: Patient tolerated procedure well. Injection site dressed with a band-aid.  No follow-ups on file.

## 2024-09-22 ENCOUNTER — Encounter (HOSPITAL_COMMUNITY): Payer: Self-pay | Admitting: Psychiatry

## 2024-09-22 ENCOUNTER — Telehealth (HOSPITAL_COMMUNITY): Admitting: Psychiatry

## 2024-09-22 VITALS — Wt 208.0 lb

## 2024-09-22 DIAGNOSIS — F330 Major depressive disorder, recurrent, mild: Secondary | ICD-10-CM | POA: Diagnosis not present

## 2024-09-22 DIAGNOSIS — F419 Anxiety disorder, unspecified: Secondary | ICD-10-CM

## 2024-09-22 MED ORDER — DULOXETINE HCL 20 MG PO CPEP: 20.0000 mg | ORAL_CAPSULE | Freq: Every day | ORAL | 1 refills | Status: AC

## 2024-09-22 MED ORDER — BUPROPION HCL ER (XL) 150 MG PO TB24: 150.0000 mg | ORAL_TABLET | Freq: Every day | ORAL | 1 refills | Status: AC

## 2024-09-22 NOTE — Progress Notes (Signed)
 " [facility] MD Virtual Progress Note   Patient Location: Home Provider Location: Home Office  I connect with patient by video and verified that I am speaking with correct person by using two identifiers. I discussed the limitations of evaluation and management by telemedicine and the availability of in person appointments. I also discussed with the patient that there may be a patient responsible charge related to this service. The patient expressed understanding and agreed to proceed.  Randy Peterson [id number] 79 y.o.  09/22/2024 10:02 AM  History of Present Illness:  Patient is evaluated by video session.  He reported things are going very well.  He had a very good time with the family and is able to see his family.  His grandson is now 32-month-old.  Patient told having the company of his grandson around Christmas was joyful.  He is sleeping good.  Denies any crying spells or any feeling of hopelessness or worthlessness.  Recently had a physical and blood work with Dr. Windy.  Patient told no new medication added and his labs are stable.  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  He is on low-dose Wellbutrin  and Cymbalta .  He has no tremor or shakes or any EPS.  His appetite is okay and his weight stable.  In the past we tried to cut down and keep him on 1 antidepressant but his anxiety come back.  Patient excited as going to beach next month with the family.  He like to keep his current medication.  Past Psychiatric History: H/O inpatient in 2008 for suicidal attempt and cut wrist after he lost the job. Took Paxil, Risperdal and given Vyvanse  for attention and focus.  Took Xanax  for a while until does not needed.  Cymbalta  and Wellbutrin  helped.       Outpatient Encounter Medications as of 09/22/2024  Medication Sig   acetaminophen  (TYLENOL ) 325 MG tablet Take 2 tablets (650 mg total) by mouth every 4 (four) hours as needed for headache or mild pain.   aspirin   EC 81 MG tablet Take 81 mg by mouth daily. Swallow whole.   atorvastatin  (LIPITOR ) 80 MG tablet Take 1 tablet (80 mg total) by mouth daily at 6 PM.   buPROPion  (WELLBUTRIN  XL) 150 MG 24 hr tablet Take 1 tablet (150 mg total) by mouth daily.   cholecalciferol  (VITAMIN D ) 1000 UNITS tablet Take 1,000 Units by mouth daily.   DULoxetine  (CYMBALTA ) 20 MG capsule Take 1 capsule (20 mg total) by mouth daily.   ezetimibe  (ZETIA ) 10 MG tablet Take 10 mg by mouth every evening.   finasteride  (PROSCAR ) 5 MG tablet Take 5 mg by mouth every evening.    hyoscyamine (ANASPAZ) 0.125 MG TBDP disintergrating tablet Place 0.125 mg under the tongue every 6 (six) hours as needed.   nitroGLYCERIN  (NITROSTAT ) 0.4 MG SL tablet Place 1 tablet (0.4 mg total) under the tongue every 5 (five) minutes as needed for chest pain.   pantoprazole  (PROTONIX ) 40 MG tablet Take 40 mg by mouth daily.   RABEprazole (ACIPHEX) 20 MG tablet Take 20 mg by mouth daily.   tadalafil (CIALIS) 5 MG tablet    tamsulosin  (FLOMAX ) 0.4 MG CAPS capsule Take 0.4 mg by mouth every evening.    No facility-administered encounter medications on file as of 09/22/2024.    No results found for this or any previous visit (from the past 2160 hours).   Psychiatric Specialty Exam: Physical Exam  Review of Systems  Weight 208  lb (94.3 kg).There is no height or weight on file to calculate BMI.  General Appearance: Casual  Eye Contact:  Good  Speech:  Clear and Coherent and Normal Rate  Volume:  Normal  Mood:  Euthymic  Affect:  Appropriate  Thought Process:  Goal Directed  Orientation:  Full (Time, Place, and Person)  Thought Content:  Logical  Suicidal Thoughts:  No  Homicidal Thoughts:  No  Memory:  Immediate;   Good Recent;   Good Remote;   Good  Judgement:  Good  Insight:  Present  Psychomotor Activity:  Normal  Concentration:  Concentration: Good and Attention Span: Good  Recall:  Good  Fund of Knowledge:  Good  Language:  Good   Akathisia:  No  Handed:  Right  AIMS (if indicated):     Assets:  Communication Skills Desire for Improvement Housing Social Support Transportation  ADL's:  Intact  Cognition:  WNL  Sleep:  ok       09/15/2015   12:19 PM 06/12/2015   12:16 PM  Depression screen PHQ 2/9  Decreased Interest 0 0  Down, Depressed, Hopeless 0 0  PHQ - 2 Score 0 0    Assessment/Plan: Major depressive disorder, recurrent episode, mild - Plan: buPROPion  (WELLBUTRIN  XL) 150 MG 24 hr tablet, DULoxetine  (CYMBALTA ) 20 MG capsule  Anxiety - Plan: buPROPion  (WELLBUTRIN  XL) 150 MG 24 hr tablet, DULoxetine  (CYMBALTA ) 20 MG capsule  Patient is 79 year old Caucasian man with major depressive disorder, anxiety.  He is taking multiple medication for his general medical need.  Recently had a visit with primary care and reported no new medication and labs were stable.  Patient symptoms are also unchanged from the past and he is stable on low-dose Cymbalta  and Wellbutrin .  In the past he tried to keep him on 1 medication but his anxiety come back.  Patient is comfortable taking these medication and reported no tremor or shakes or any EPS.  Continue Wellbutrin  XL 150 mg in the morning and Cymbalta  20 mg daily.  Recommend to call back if is any question or any concern.  Follow-up in 6 months.   Follow Up Instructions:     I discussed the assessment and treatment plan with the patient. The patient was provided an opportunity to ask questions and all were answered. The patient agreed with the plan and demonstrated an understanding of the instructions.   The patient was advised to call back or seek an in-person evaluation if the symptoms worsen or if the condition fails to improve as anticipated.    Collaboration of Care: Other provider involved in patient's care AEB notes are available in epic to review  Patient/Guardian was advised Release of Information must be obtained prior to any record release in order to  collaborate their care with an outside provider. Patient/Guardian was advised if they have not already done so to contact the registration department to sign all necessary forms in order for us  to release information regarding their care.   Consent: Patient/Guardian gives verbal consent for treatment and assignment of benefits for services provided during this visit. Patient/Guardian expressed understanding and agreed to proceed.     Total encounter time 16 minutes which includes face-to-face time, chart reviewed, care coordination, order entry and documentation during this encounter.   Note: This document was prepared by [employer] voice dictation technology and any errors that results from this process are unintentional.    Leni ONEIDA Client, MD 09/22/2024   "

## 2025-03-22 ENCOUNTER — Telehealth (HOSPITAL_COMMUNITY): Admitting: Psychiatry
